# Patient Record
Sex: Male | Born: 1995
Health system: Southern US, Community
[De-identification: ages and names within clinical notes are randomized; demographics above are authoritative.]

## PROBLEM LIST (undated history)

## (undated) DIAGNOSIS — K5900 Constipation, unspecified: Secondary | ICD-10-CM

## (undated) DIAGNOSIS — F209 Schizophrenia, unspecified: Secondary | ICD-10-CM

## (undated) DIAGNOSIS — M545 Low back pain, unspecified: Secondary | ICD-10-CM

## (undated) DIAGNOSIS — F319 Bipolar disorder, unspecified: Secondary | ICD-10-CM

## (undated) DIAGNOSIS — Z8679 Personal history of other diseases of the circulatory system: Secondary | ICD-10-CM

## (undated) DIAGNOSIS — E079 Disorder of thyroid, unspecified: Secondary | ICD-10-CM

## (undated) DIAGNOSIS — K589 Irritable bowel syndrome without diarrhea: Secondary | ICD-10-CM

## (undated) DIAGNOSIS — M2550 Pain in unspecified joint: Secondary | ICD-10-CM

## (undated) DIAGNOSIS — R61 Generalized hyperhidrosis: Secondary | ICD-10-CM

## (undated) DIAGNOSIS — F329 Major depressive disorder, single episode, unspecified: Secondary | ICD-10-CM

## (undated) DIAGNOSIS — F418 Other specified anxiety disorders: Secondary | ICD-10-CM

## (undated) DIAGNOSIS — E559 Vitamin D deficiency, unspecified: Secondary | ICD-10-CM

## (undated) HISTORY — DX: Constipation, unspecified: K59.00

## (undated) HISTORY — DX: Vitamin D deficiency, unspecified: E55.9

## (undated) HISTORY — DX: Personal history of other diseases of the circulatory system: Z86.79

## (undated) HISTORY — DX: Pain in unspecified joint: M25.50

## (undated) HISTORY — DX: Generalized hyperhidrosis: R61

## (undated) HISTORY — DX: Bipolar disorder, unspecified: F31.9

## (undated) HISTORY — DX: Low back pain, unspecified: M54.50

## (undated) HISTORY — DX: Major depressive disorder, single episode, unspecified: F32.9

## (undated) HISTORY — DX: Irritable bowel syndrome, unspecified: K58.9

## (undated) HISTORY — DX: Disorder of thyroid, unspecified: E07.9

## (undated) HISTORY — DX: Schizophrenia, unspecified: F20.9

## (undated) HISTORY — DX: Other specified anxiety disorders: F41.8

## (undated) HISTORY — PX: NO PAST SURGERIES: SHX2092

---
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
## 2016-05-11 ENCOUNTER — Ambulatory Visit (INDEPENDENT_AMBULATORY_CARE_PROVIDER_SITE_OTHER): Payer: 59 | Admitting: Family Medicine

## 2016-05-11 ENCOUNTER — Encounter: Payer: Self-pay | Admitting: Family Medicine

## 2016-05-11 VITALS — BP 104/68 | HR 77 | Temp 98.1°F | Ht 68.0 in | Wt 167.8 lb

## 2016-05-11 DIAGNOSIS — F418 Other specified anxiety disorders: Secondary | ICD-10-CM | POA: Diagnosis not present

## 2016-05-11 MED ORDER — DULOXETINE HCL 30 MG PO CPEP: 30.0000 mg | ORAL_CAPSULE | Freq: Every day | ORAL | 1 refills | Status: DC

## 2016-05-11 MED FILL — DULoxetine HCL 30 MG CPEP: 30 | 30 days supply | Qty: 30 | Fill #0

## 2016-05-11 NOTE — Progress Notes (Signed)
Chief Complaint  Patient presents with  . Establish Care    Pt would like Rx for depression and has been seeing a Psychologist    Subjective Albert [name] is an 20 y.o. male who presents with depression and anxiety. He is here with his mother. Symptoms began around 3 years ago. Anxiety symptoms: difficulty concentrating, insomnia, racing thoughts, fixating and worrying. Depressive symptoms depressed mood, anhedonia, insomnia, fatigue, difficulty concentrating,.  Family history significant for anxiety and depression. His mother had a bad reaction to Prozac and his father had a bad reaction to Lexapro. Social stressors include school. He is currently being treated with Individual therapy and has Been on any medication for this issue. He is following with a psychologist.  Past Medical History:  Diagnosis Date  . History of PSVT (paroxysmal supraventricular tachycardia)     Medications Takes no medications routinely.  Allergies Allergies  Allergen Reactions  . Decongestant [Oxymetazoline]     Allergic to all decongestants  . Sulfa Antibiotics     Family history of severe reactions   Family History Family History  Problem Relation Age of Onset  . Sudden death Neg Hx   . Heart attack Neg Hx     Review Of Systems Constitutional:  no unexplained fevers, sweats, or chills Cardiovascular:  no chest pain, no palpitations Gastrointestinal:  no nausea, vomiting, diarrhea, or constipation Psychiatric: as noted in HPI  Exam BP 104/68 (BP Location: Left Arm, Patient Position: Sitting, Cuff Size: Small)   Pulse 77   Temp 98.1 F (36.7 C) (Oral)   Ht 5\' 8"  (1.727 m)   Wt 167 lb 12.8 oz (76.1 kg)   SpO2 98%   BMI 25.51 kg/m  General:  well developed, well nourished, in no apparent distress Neck: neck supple without adenopathy, thyromegaly, or masses Lungs:  clear to auscultation, breath sounds equal bilaterally, normal respiratory effort without accessory muscle  use Cardio:  regular rate and rhythm without murmurs Abdomen:  abdomen soft, nontender; bowel sounds normal; no masses or organomegaly Neuro:  deep tendon reflexes normal and symmetric and no cerebellar signs or ataxia noted Psych: well oriented with normal range of affect and age-appropriate judgement/insight  Assessment and Plan  Anxiety with depression - Plan: DULoxetine (CYMBALTA) 30 MG capsule  Status: New  Counseled on the diagnosis, course and treatment of the above condition. Continue with counseling. Discussed starting supplemental vitamin B6 and continue with exercise. Could add weightlifting to his regimen. Suicidal ideation was strongly denied Follow up in 1 mo. The patient and his mother voiced understanding and agreement to the plan.  [place], DO 05/11/16 11:06 AM

## 2016-05-11 NOTE — Progress Notes (Signed)
Pre visit review using our clinic review tool, if applicable. No additional management support is needed unless otherwise documented below in the visit note. 

## 2016-06-10 ENCOUNTER — Ambulatory Visit (INDEPENDENT_AMBULATORY_CARE_PROVIDER_SITE_OTHER): Payer: 59 | Admitting: Family Medicine

## 2016-06-10 ENCOUNTER — Encounter: Payer: Self-pay | Admitting: Family Medicine

## 2016-06-10 VITALS — BP 104/66 | HR 68 | Temp 98.2°F | Ht 69.0 in | Wt 166.0 lb

## 2016-06-10 DIAGNOSIS — F418 Other specified anxiety disorders: Secondary | ICD-10-CM

## 2016-06-10 MED ORDER — DULOXETINE HCL 30 MG PO CPEP: 30.0000 mg | ORAL_CAPSULE | Freq: Every day | ORAL | 5 refills | Status: DC

## 2016-06-10 MED FILL — DULoxetine HCL 30 MG CPEP: 30 | 30 days supply | Qty: 30 | Fill #0

## 2016-06-10 NOTE — Progress Notes (Signed)
Chief Complaint  Patient presents with  . Follow-up    anxiety/depression Pt reports doing better but is unsure if it is related to the mediaction     Subjective Albert [name] presents for f/u anxiety/depression.  Seen 1 mo ago and started on Cymbalta.  Doing better overall, doing well on medication, no side effects. Stressors in life have decreased as his class schedule and living environment (from an aesthetic approach) have improved. He is going to start boxing as a hobby. No thoughts of harming self or others. No self-medication with alcohol, prescription drugs or illicit drugs.  ROS Psych: No homicidal or suicidal thoughts  Past Medical History:  Diagnosis Date  . History of PSVT (paroxysmal supraventricular tachycardia)    Family History  Problem Relation Age of Onset  . Sudden death Neg Hx   . Heart attack Neg Hx    Allergies as of 06/10/2016      Reactions   Decongestant [oxymetazoline]    Allergic to all decongestants   Sulfa Antibiotics    Family history of severe reactions      Medication List       Accurate as of 06/10/16 11:28 AM. Always use your most recent med list.          DULoxetine 30 MG capsule Commonly known as:  CYMBALTA Take 1 capsule (30 mg total) by mouth daily.       Exam BP 104/66 (BP Location: Right Arm, Patient Position: Sitting, Cuff Size: Small)   Pulse 68   Temp 98.2 F (36.8 C) (Oral)   Ht 5\' 9"  (1.753 m)   Wt 166 lb (75.3 kg)   SpO2 98%   BMI 24.51 kg/m  General:  well developed, well nourished, in no apparent distress Neck: neck supple without adenopathy, thyromegaly, or masses Lungs:  clear to auscultation, breath sounds equal bilaterally, no respiratory distress Cardio:  regular rate and rhythm without murmurs, heart sounds without clicks or rubs Psych: well oriented with normal range of affect and age-appropriate judgement/insight, alert and oriented x4.  Assessment and Plan  Anxiety with depression - Plan:  DULoxetine (CYMBALTA) 30 MG capsule  Orders as above. Will stay on current dose of Cymbalta, discussed coming off of the medication with pt. He would like to stay on it longer and discuss coming off of it at the next appointment. F/u in 4 mo after he finishes the semester, sooner if needed. The patient voiced understanding and agreement to the plan.  [place], DO 06/10/16 11:28 AM

## 2016-06-10 NOTE — Progress Notes (Signed)
Pre visit review using our clinic review tool, if applicable. No additional management support is needed unless otherwise documented below in the visit note. 

## 2016-06-10 NOTE — Patient Instructions (Signed)
If you decide to wean off of your medication, take a tab every other day for 5 doses, then stop. Please let our office know if you make this change.

## 2016-07-08 MED FILL — DULoxetine HCL 30 MG CPEP: 30 | 30 days supply | Qty: 30 | Fill #1

## 2016-08-05 MED FILL — DULoxetine HCL 30 MG CPEP: 30 | 30 days supply | Qty: 30 | Fill #2

## 2016-08-12 ENCOUNTER — Telehealth: Payer: Self-pay | Admitting: Family Medicine

## 2016-08-12 NOTE — Telephone Encounter (Signed)
°  Relation to FX:OVAN Call back number:[phone]  Reason for call:  Patient requesting increase to 60 MG from 30, patient states the 60 would be more effective regarding the DULoxetine (CYMBALTA), please advise

## 2016-08-15 NOTE — Telephone Encounter (Signed)
Called and spoke with the pt and informed him of the message below.  Pt verbalized understanding and agreed.  Pt will check with the pharmacy and will let me know if I need to send in a new prescription for the Cymbalta now.//AB/CMA

## 2016-08-15 NOTE — Telephone Encounter (Signed)
OK. Take 2 caps daily until he runs out. We can call in higher dose at that time. Schedule in 4-6 weeks with me to see how we are doing. TY.

## 2016-08-22 MED ORDER — DULOXETINE HCL 60 MG PO CPEP
60.0000 mg | ORAL_CAPSULE | Freq: Every day | ORAL | 1 refills | Status: DC
Start: 2016-08-22 — End: 2016-09-21

## 2016-08-22 NOTE — Telephone Encounter (Addendum)
Called and spoke with the pt and informed him that the  prescription has been sent to the pharmacy.  Also informed him that Dr. Nani Ravens would like to see him in 4-5 weeks after starting the new dose.  Pt agreed and was scheduled for (Fri-09/30/16 @ 9:00am).//AB/CMA

## 2016-08-22 NOTE — Telephone Encounter (Signed)
New Rx sent to the pharmacy by e-script.//AB/CMA

## 2016-08-22 NOTE — Addendum Note (Signed)
Addended by: Harl Bowie on: 08/22/2016 04:48 PM   Modules accepted: Orders

## 2016-08-22 NOTE — Telephone Encounter (Signed)
Patient states he need rx forDULoxetine (CYMBALTA) 30 MG capsule increased to  60mg  [facility] [phone]  Call back number

## 2016-08-23 MED FILL — DULoxetine HCL 60 MG CPEP: 60 | 30 days supply | Qty: 30 | Fill #0

## 2016-09-21 ENCOUNTER — Telehealth: Payer: Self-pay | Admitting: Family Medicine

## 2016-09-21 MED ORDER — DULOXETINE HCL 60 MG PO CPEP: 60.0000 mg | ORAL_CAPSULE | Freq: Every day | ORAL | 1 refills | Status: DC

## 2016-09-21 MED FILL — DULoxetine HCL 60 MG CPEP: 60 | 30 days supply | Qty: 30 | Fill #1

## 2016-09-21 NOTE — Telephone Encounter (Signed)
Caller name: Relationship to patient: Self Can be reached: [phone]  Pharmacy: [facility] [place]  Reason for call: Refill DULoxetine (CYMBALTA) 60 MG capsule [[id number]]

## 2016-09-21 NOTE — Telephone Encounter (Signed)
Patient notified that rx has been sent in and to keep his appt 09/30/16.

## 2016-09-30 ENCOUNTER — Encounter: Payer: Self-pay | Admitting: Family Medicine

## 2016-09-30 ENCOUNTER — Ambulatory Visit (INDEPENDENT_AMBULATORY_CARE_PROVIDER_SITE_OTHER): Payer: 59 | Admitting: Family Medicine

## 2016-09-30 VITALS — BP 106/66 | HR 52 | Temp 98.2°F | Ht 69.0 in | Wt 160.2 lb

## 2016-09-30 DIAGNOSIS — F418 Other specified anxiety disorders: Secondary | ICD-10-CM | POA: Diagnosis not present

## 2016-09-30 HISTORY — DX: Other specified anxiety disorders: F41.8

## 2016-09-30 MED ORDER — BUPROPION HCL ER (XL) 150 MG PO TB24: 150.0000 mg | ORAL_TABLET | Freq: Every day | ORAL | 2 refills | Status: DC

## 2016-09-30 MED FILL — buPROPion HCL ER (XL) 150 M: 150 | 30 days supply | Qty: 30 | Fill #0

## 2016-09-30 NOTE — Patient Instructions (Signed)
Let us know if you need anything.  Great work with Dietitian.

## 2016-09-30 NOTE — Progress Notes (Signed)
Chief Complaint  Patient presents with  . Follow-up    on medication-pt not sure if the meds are working    Subjective: Patient is a 21 y.o. male here for f/u anxiety and depression.   Pt currently on Cymbalta. Feels it is helping his anxiety symptoms. Just got done with finals and things have improved. Still having issues with depressive symptoms. He does work out routinely with running and recently started boxing which has also been helpful.  No SI or HI ideation. No self medication. He is going to work for [employer] this summer. He is out of the dorm which was stressful for him as well.  Sees a psychologist weekly. Feels it is a great help.   ROS: Psych: No SI or HI  Family History  Problem Relation Age of Onset  . Anxiety disorder Mother   . Sudden death Neg Hx   . Heart attack Neg Hx    Past Medical History:  Diagnosis Date  . Anxiety with depression 09/30/2016  . History of PSVT (paroxysmal supraventricular tachycardia)    Allergies  Allergen Reactions  . Decongestant [Oxymetazoline]     Allergic to all decongestants  . Sulfa Antibiotics     Family history of severe reactions    Current Outpatient Prescriptions:  .  DULoxetine (CYMBALTA) 60 MG capsule, Take 1 capsule (60 mg total) by mouth daily., Disp: 30 capsule, Rfl: 1 .  buPROPion (WELLBUTRIN XL) 150 MG 24 hr tablet, Take 1 tablet (150 mg total) by mouth daily., Disp: 30 tablet, Rfl: 2  Objective: BP 106/66 (BP Location: Left Arm, Patient Position: Sitting, Cuff Size: Normal)   Pulse (!) 52   Temp 98.2 F (36.8 C) (Oral)   Ht 5\' 9"  (1.753 m)   Wt 160 lb 3.2 oz (72.7 kg)   SpO2 98%   BMI 23.66 kg/m  General: Awake, appears stated age HEENT: MMM, EOMi  Heart: RRR, no murmurs Lungs: CTAB, no rales, wheezes or rhonchi. No accessory muscle use Psych: Age appropriate judgment and insight, normal affect and mood  Assessment and Plan: Anxiety with depression - Plan: buPROPion (WELLBUTRIN XL) 150 MG 24  hr tablet, DISCONTINUED: buPROPion (WELLBUTRIN XL) 150 MG 24 hr tablet  Orders as above. Cont Cymbalta. Start Wellbutrin. Keep up boxing and stay with counselor.  F/u in 6 weeks. The patient voiced understanding and agreement to the plan.  [place], DO 09/30/16  9:38 AM

## 2016-10-12 ENCOUNTER — Telehealth: Payer: Self-pay | Admitting: Family Medicine

## 2016-10-12 NOTE — Telephone Encounter (Signed)
Called patient at both numbers listed and left message to return call.

## 2016-10-12 NOTE — Telephone Encounter (Signed)
Stop Wellbutrin Continue Cymbalta 60 mg daily as before Anticipate that insomnia and compulsive behavior will gradually improve. Call in 2 weeks and discuss w/  PCP next steps

## 2016-10-12 NOTE — Telephone Encounter (Signed)
°  Relation to AV:WUJW Call back [place]   Reason for call:  Patient states he thinks buPROPion (WELLBUTRIN XL) 150 MG 24 hr tablet experiencing sleepless night, compulsive cleaning and anxiety, patient declined appointment stating he was last seen 09/30/16 and currently in [place], please advise   *informed patient PCP is out of the office, patient would like to speak with a nurse or covering physician.

## 2016-10-12 NOTE — Telephone Encounter (Signed)
Last OV 09/30/16 for anxiety/depression. Please advise.

## 2016-10-12 NOTE — Telephone Encounter (Signed)
Pt notified of instructions and verbalized understanding. He will call to discuss next steps w/ PCP.  6 week follow-up w/ PCP is also scheduled per last AVS.

## 2016-10-27 ENCOUNTER — Telehealth: Payer: Self-pay | Admitting: Family Medicine

## 2016-10-27 MED ORDER — DULOXETINE HCL 60 MG PO CPEP: 60.0000 mg | ORAL_CAPSULE | Freq: Every day | ORAL | 1 refills | Status: DC

## 2016-10-27 MED FILL — DULoxetine HCL 60 MG CPEP: 60 | 30 days supply | Qty: 30 | Fill #0

## 2016-10-27 NOTE — Telephone Encounter (Signed)
Pt request refill cymbalta 60 mg he has no more. Pt uses [facility].

## 2016-10-27 NOTE — Telephone Encounter (Signed)
He should have a refill, but I called in more just in case. TY.

## 2016-10-27 NOTE — Telephone Encounter (Signed)
Called and spoke with the pt and informed him of the message below.   Pt stated that he has already picked up the prescription.//AB/CMA

## 2016-10-31 ENCOUNTER — Encounter: Payer: Self-pay | Admitting: Family Medicine

## 2016-10-31 ENCOUNTER — Ambulatory Visit (INDEPENDENT_AMBULATORY_CARE_PROVIDER_SITE_OTHER): Payer: 59 | Admitting: Family Medicine

## 2016-10-31 VITALS — BP 128/70 | HR 75 | Temp 98.5°F | Ht 69.0 in | Wt 160.4 lb

## 2016-10-31 DIAGNOSIS — F418 Other specified anxiety disorders: Secondary | ICD-10-CM | POA: Diagnosis not present

## 2016-10-31 MED ORDER — QUETIAPINE FUMARATE 50 MG PO TABS: 50.0000 mg | ORAL_TABLET | Freq: Every day | ORAL | 0 refills | Status: DC

## 2016-10-31 MED FILL — QUETIAPINE FUMARATE 50 MG T: 50 | 30 days supply | Qty: 30 | Fill #0

## 2016-10-31 NOTE — Patient Instructions (Signed)
If things are not ideal in the next 1-2 weeks, call and we can try the doxepin.  Let us know if you have any issues with your psych appointment in July. I will see you if you need [name].

## 2016-10-31 NOTE — Progress Notes (Signed)
Chief Complaint  Patient presents with  . 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    Subjective: Patient is a 21 y.o. male here for anxiety follow up.  Did not do well with Wellbutrin- made him more anxious, decreased appetite. On July 3 he will see psych who want him to stay on Cymbalta. He is compliant with Cymbalta and is not having any adverse effects with this. He is having anxiety, depression, anhedonia, poor appetite, and poor sleep.  Some suicidal thoughts. No plan, listed things he has to live for including boxing, looking forward to getting his degree and his supportive family. No plan or means. No HI. No self medication.  ROS: Psych: As noted in HPI  Family History  Problem Relation Age of Onset  . Anxiety disorder Mother   . Sudden death Neg Hx   . Heart attack Neg Hx    Past Medical History:  Diagnosis Date  . Anxiety with depression 09/30/2016  . History of PSVT (paroxysmal supraventricular tachycardia)    Allergies  Allergen Reactions  . Decongestant [Oxymetazoline]     Allergic to all decongestants  . Sulfa Antibiotics     Family history of severe reactions    Current Outpatient Prescriptions:  .  DULoxetine (CYMBALTA) 60 MG capsule, Take 1 capsule (60 mg total) by mouth daily., Disp: 30 capsule, Rfl: 1 .  QUEtiapine (SEROQUEL) 50 MG tablet, Take 1 tablet (50 mg total) by mouth at bedtime., Disp: 30 tablet, Rfl: 0  Objective: BP 128/70 (BP Location: Left Arm, Patient Position: Sitting, Cuff Size: Normal)   Pulse 75   Temp 98.5 F (36.9 C) (Oral)   Ht 5\' 9"  (1.753 m)   Wt 160 lb 6.4 oz (72.8 kg)   SpO2 99%   BMI 23.69 kg/m  General: Awake, appears stated age HEENT: MMM, EOMi Heart: RRR, no murmurs Lungs: CTAB, no rales, wheezes or rhonchi. No accessory muscle use Psych: Age appropriate judgment and insight,  normal affect and mood  Assessment and Plan: Anxiety with depression - Plan: QUEtiapine (SEROQUEL) 50 MG tablet  Orders as above. Seroquel to help with both symptoms and underlying issue. Will call in 1-2 weeks and let us know if he would like to change to doxepin.  F/u prn as he is going to see psych to take over in around 3 weeks.  The patient voiced understanding and agreement to the plan.  [place], DO 10/31/16  10:45 AM

## 2016-11-04 ENCOUNTER — Telehealth: Payer: Self-pay | Admitting: Family Medicine

## 2016-11-04 NOTE — Telephone Encounter (Signed)
Patient mother dropped off immuization paper work, requesting it to be completed and emailed to [email or web address], documents placed in tray at front desk

## 2016-11-07 ENCOUNTER — Telehealth: Payer: Self-pay | Admitting: *Deleted

## 2016-11-07 NOTE — Telephone Encounter (Signed)
Enter in error.//AB/CMA

## 2016-11-07 NOTE — Telephone Encounter (Signed)
Called and [facility] @ 7:39am @ [phone]) asking the pt to RTC regarding Immunization record form.//AB/CMA

## 2016-11-10 NOTE — Telephone Encounter (Signed)
Mother returning call best # [phone]

## 2016-11-10 NOTE — Telephone Encounter (Signed)
Called and [facility] @ 9:37am @ [phone]) asking the pt to RTC regarding form to be filled out.//AB/CMA

## 2016-11-16 ENCOUNTER — Telehealth: Payer: Self-pay | Admitting: Family Medicine

## 2016-11-16 MED ORDER — LORAZEPAM 0.5 MG PO TABS: 0.5000 mg | ORAL_TABLET | Freq: Every day | ORAL | 0 refills | Status: DC | PRN

## 2016-11-16 MED FILL — LORazepam 0.5 MG TABS: 0.5 | 6 days supply | Qty: 6 | Fill #0

## 2016-11-16 NOTE — Telephone Encounter (Signed)
Wellbutrin might be better if it is more depressive symptoms. We can try Ativan if his anxiety is the issue. TY.

## 2016-11-16 NOTE — Telephone Encounter (Signed)
Caller name: Lattie Haw Relationship to patient: mother Can be reached: [phone] [employer]'s cell  Reason for call: pt mom called requesting Angie to f/u with pt about medication change they discussed earlier today

## 2016-11-16 NOTE — Telephone Encounter (Signed)
Pt's mother called to say that the pt was put on Seroquel for sleep.  He is sleeping better,but his depression is worse.  The pt has an appt a Surveyor, mining on (Wed-11/22/16).  The pt's mother would like to stop the Seroquel and would like to request something simply and short term until Wed.  She's requesting Ativan .5mg  #6.  Please advise.//AB/CMA

## 2016-11-17 ENCOUNTER — Ambulatory Visit: Payer: 59 | Admitting: Family Medicine

## 2016-11-18 NOTE — Telephone Encounter (Signed)
Spoke with the pt on (11/16/16) and informed him of the message below.  Pt stated that he has tried the Wellbutrin before and it did not work.  Informed the pt that Dr. Napoleon Form the Ativan.  New prescription faxed to the pharmacy.  Confirmation received.//AB/CMA

## 2016-11-18 NOTE — Telephone Encounter (Signed)
Called and spoke with the pt and informed him that the Immunization sheet has been completed and signed and I will leave it up front for his mother to pickup.  He verbalized understanding and agreed.//AB/CMA

## 2016-11-18 NOTE — Telephone Encounter (Signed)
Wellbutrin might be better if it is more depressive symptoms. We can try Ativan if his anxiety is the issue. TY.

## 2016-11-22 ENCOUNTER — Telehealth: Payer: Self-pay | Admitting: Family Medicine

## 2016-11-22 DIAGNOSIS — F3181 Bipolar II disorder: Secondary | ICD-10-CM | POA: Diagnosis not present

## 2016-11-22 MED FILL — LORazepam 0.5 MG TABS: 0.5 | 30 days supply | Qty: 60 | Fill #0

## 2016-11-22 MED FILL — LITHIUM CARBONATE ER 300 MG: 300 | 30 days supply | Qty: 30 | Fill #0

## 2016-11-22 MED FILL — DULoxetine HCL 60 MG CPEP: 60 | 30 days supply | Qty: 30 | Fill #0

## 2016-11-22 NOTE — Telephone Encounter (Signed)
Caller name:Dr Francene Finders Relationship to patient: Can be reached:[phone] Pharmacy:  Reason for call:Reqesting call back, would like to speak with provider regarding patient. Patient has just established care with her

## 2016-11-24 MED FILL — DULoxetine HCL 20 MG CPEP: 20 | 30 days supply | Qty: 90 | Fill #0

## 2016-11-24 NOTE — Telephone Encounter (Signed)
Tried to call Dr. Blair Dolphin, LVM.

## 2016-12-01 DIAGNOSIS — F3181 Bipolar II disorder: Secondary | ICD-10-CM | POA: Diagnosis not present

## 2016-12-07 NOTE — Telephone Encounter (Signed)
Spoke with Dr. Blair Dolphin about patient's case. She asked me my thoughts of the pt. Discussed that we had some initial success tx'ing for anxiety/depression that I initially thought was situational and then response was poor. Tried a few more things before he was scheduled with psych and then have not had follow up. 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 She has him on a low dose of lithium thinking it could be related to schizophrenia or bipolar. A dedicated psychology evaluation is coming up. She will keep [name] updated.

## 2016-12-09 DIAGNOSIS — F3181 Bipolar II disorder: Secondary | ICD-10-CM | POA: Diagnosis not present

## 2016-12-09 MED FILL — LITHIUM CARBONATE ER 300 MG: 300 | 30 days supply | Qty: 60 | Fill #0

## 2016-12-23 DIAGNOSIS — F32A Depression, unspecified: Secondary | ICD-10-CM | POA: Insufficient documentation

## 2016-12-23 DIAGNOSIS — F419 Anxiety disorder, unspecified: Secondary | ICD-10-CM | POA: Insufficient documentation

## 2016-12-23 DIAGNOSIS — F3181 Bipolar II disorder: Secondary | ICD-10-CM | POA: Diagnosis not present

## 2016-12-27 MED FILL — DULoxetine HCL 20 MG CPEP: 20 | 30 days supply | Qty: 90 | Fill #1

## 2016-12-27 MED FILL — ARIPiprazole 2 MG TABS: 2 | 30 days supply | Qty: 30 | Fill #0

## 2017-01-04 ENCOUNTER — Telehealth: Payer: Self-pay | Admitting: Family Medicine

## 2017-01-04 DIAGNOSIS — F418 Other specified anxiety disorders: Secondary | ICD-10-CM | POA: Diagnosis not present

## 2017-01-04 NOTE — Telephone Encounter (Signed)
Please advise.//AB/CMA 

## 2017-01-04 NOTE — Telephone Encounter (Signed)
Pt's mom called in because she said that pt is going away to school and need a second meningitis vac. She would like to have orders placed by provider. I will call back to schedule.    Mom - work [phone]

## 2017-01-04 NOTE — Telephone Encounter (Signed)
That's fine TY

## 2017-01-05 ENCOUNTER — Ambulatory Visit (INDEPENDENT_AMBULATORY_CARE_PROVIDER_SITE_OTHER): Payer: 59 | Admitting: Behavioral Health

## 2017-01-05 DIAGNOSIS — Z23 Encounter for immunization: Secondary | ICD-10-CM

## 2017-01-05 DIAGNOSIS — F3181 Bipolar II disorder: Secondary | ICD-10-CM | POA: Diagnosis not present

## 2017-01-05 MED FILL — LITHIUM CARBONATE ER 300 MG: 300 | 30 days supply | Qty: 60 | Fill #0

## 2017-01-05 NOTE — Progress Notes (Signed)
Pre visit review using our clinic review tool, if applicable. No additional management support is needed unless otherwise documented below in the visit note.  Patient came in clinic for meningococcal vaccination. IM injection was given in the left deltoid. Patient tolerated injection well. He will call the office to schedule his next appointment.

## 2017-01-09 DIAGNOSIS — F418 Other specified anxiety disorders: Secondary | ICD-10-CM | POA: Diagnosis not present

## 2017-01-10 MED FILL — LITHIUM CARBONATE 300 MG CA: 300 | 30 days supply | Qty: 90 | Fill #0

## 2017-01-20 MED FILL — LORazepam 1 MG TABS: 1 | 30 days supply | Qty: 60 | Fill #0

## 2017-01-25 MED FILL — ARIPiprazole 2 MG TABS: 2 | 30 days supply | Qty: 30 | Fill #1

## 2017-01-25 MED FILL — DULoxetine HCL 20 MG CPEP: 20 | 30 days supply | Qty: 90 | Fill #0

## 2017-01-27 DIAGNOSIS — F333 Major depressive disorder, recurrent, severe with psychotic symptoms: Secondary | ICD-10-CM | POA: Diagnosis not present

## 2017-01-27 DIAGNOSIS — F3181 Bipolar II disorder: Secondary | ICD-10-CM | POA: Diagnosis not present

## 2017-02-06 DIAGNOSIS — F333 Major depressive disorder, recurrent, severe with psychotic symptoms: Secondary | ICD-10-CM | POA: Diagnosis not present

## 2017-02-06 DIAGNOSIS — F418 Other specified anxiety disorders: Secondary | ICD-10-CM | POA: Diagnosis not present

## 2017-02-08 MED FILL — LITHIUM CARBONATE 300 MG CA: 300 | 30 days supply | Qty: 90 | Fill #1

## 2017-02-17 MED FILL — ARIPiprazole 2 MG TABS: 2 | 10 days supply | Qty: 10 | Fill #2

## 2017-02-17 MED FILL — LORazepam 1 MG TABS: 1 | 30 days supply | Qty: 90 | Fill #0

## 2017-02-21 MED FILL — ARIPiprazole 2 MG TABS: 2 | 30 days supply | Qty: 30 | Fill #3

## 2017-02-23 DIAGNOSIS — F333 Major depressive disorder, recurrent, severe with psychotic symptoms: Secondary | ICD-10-CM | POA: Diagnosis not present

## 2017-02-23 MED FILL — SERTRALINE HCL 50 MG TABLET: 50 | 35 days supply | Qty: 60 | Fill #0

## 2017-03-06 DIAGNOSIS — F333 Major depressive disorder, recurrent, severe with psychotic symptoms: Secondary | ICD-10-CM | POA: Diagnosis not present

## 2017-03-08 MED FILL — LITHIUM CARBONATE 300 MG CA: 300 | 90 days supply | Qty: 270 | Fill #0

## 2017-03-09 MED FILL — ARIPiprazole 2 MG TABS: 2 | 30 days supply | Qty: 60 | Fill #0

## 2017-03-24 DIAGNOSIS — F333 Major depressive disorder, recurrent, severe with psychotic symptoms: Secondary | ICD-10-CM | POA: Diagnosis not present

## 2017-03-24 MED FILL — LORazepam 1 MG TABS: 1 | 30 days supply | Qty: 90 | Fill #0

## 2017-03-30 MED FILL — SERTRALINE HCL 50 MG TABLET: 50 | 30 days supply | Qty: 60 | Fill #1

## 2017-04-07 DIAGNOSIS — F333 Major depressive disorder, recurrent, severe with psychotic symptoms: Secondary | ICD-10-CM | POA: Diagnosis not present

## 2017-04-07 MED FILL — SERTRALINE HCL 100 MG TAB: 100 | 30 days supply | Qty: 60 | Fill #0

## 2017-04-10 DIAGNOSIS — F333 Major depressive disorder, recurrent, severe with psychotic symptoms: Secondary | ICD-10-CM | POA: Diagnosis not present

## 2017-05-05 DIAGNOSIS — F333 Major depressive disorder, recurrent, severe with psychotic symptoms: Secondary | ICD-10-CM | POA: Diagnosis not present

## 2017-05-05 MED FILL — LORazepam 1 MG TABS: 1 | 30 days supply | Qty: 90 | Fill #1

## 2017-05-05 MED FILL — SERTRALINE HCL 100 MG TAB: 100 | 30 days supply | Qty: 60 | Fill #1

## 2017-05-05 MED FILL — traZODone HCL 50 MG TABS: 50 | 60 days supply | Qty: 30 | Fill #0

## 2017-05-05 MED FILL — SERTRALINE HCL 50 MG TABLET: 50 | 30 days supply | Qty: 60 | Fill #2

## 2017-05-11 MED FILL — LITHIUM CARBONATE 300 MG CA: 300 | 30 days supply | Qty: 120 | Fill #0

## 2017-05-17 DIAGNOSIS — F333 Major depressive disorder, recurrent, severe with psychotic symptoms: Secondary | ICD-10-CM | POA: Diagnosis not present

## 2017-05-18 DIAGNOSIS — F333 Major depressive disorder, recurrent, severe with psychotic symptoms: Secondary | ICD-10-CM | POA: Diagnosis not present

## 2017-05-18 MED FILL — VENLAFAXINE HCL ER 37.5 MG: 37.5 | 30 days supply | Qty: 30 | Fill #0

## 2017-05-18 MED FILL — clonazePAM 1 MG TABS: 1 | 8 days supply | Qty: 30 | Fill #0

## 2017-06-01 DIAGNOSIS — F333 Major depressive disorder, recurrent, severe with psychotic symptoms: Secondary | ICD-10-CM | POA: Diagnosis not present

## 2017-06-08 DIAGNOSIS — F333 Major depressive disorder, recurrent, severe with psychotic symptoms: Secondary | ICD-10-CM | POA: Diagnosis not present

## 2017-06-08 MED FILL — FLUoxetine HCL 20 MG TABS: 20 | 30 days supply | Qty: 60 | Fill #0

## 2017-06-08 MED FILL — hydrOXYzine HCL 25 MG TABS: 25 | 30 days supply | Qty: 60 | Fill #0

## 2017-06-15 DIAGNOSIS — F329 Major depressive disorder, single episode, unspecified: Secondary | ICD-10-CM | POA: Diagnosis not present

## 2017-06-15 DIAGNOSIS — Z0000 Encounter for general adult medical examination without abnormal findings: Secondary | ICD-10-CM | POA: Diagnosis not present

## 2017-06-19 MED FILL — LITHIUM CARBONATE 300 MG CA: 300 | 30 days supply | Qty: 120 | Fill #1

## 2017-06-22 DIAGNOSIS — F333 Major depressive disorder, recurrent, severe with psychotic symptoms: Secondary | ICD-10-CM | POA: Diagnosis not present

## 2017-06-22 MED FILL — DOXEPIN 10 MG CAPSULE: 10 | 30 days supply | Qty: 60 | Fill #0

## 2017-07-06 DIAGNOSIS — Z23 Encounter for immunization: Secondary | ICD-10-CM | POA: Diagnosis not present

## 2017-07-06 DIAGNOSIS — F3181 Bipolar II disorder: Secondary | ICD-10-CM | POA: Diagnosis not present

## 2017-07-06 MED FILL — DOXEPIN 50 MG CAPSULE: 50 | 30 days supply | Qty: 30 | Fill #0

## 2017-07-10 DIAGNOSIS — F3181 Bipolar II disorder: Secondary | ICD-10-CM | POA: Diagnosis not present

## 2017-07-11 MED FILL — LORazepam 1 MG TABS: 1 | 30 days supply | Qty: 90 | Fill #2

## 2017-07-11 MED FILL — LITHIUM CARBONATE 300 MG CA: 300 | 30 days supply | Qty: 150 | Fill #0

## 2017-07-20 DIAGNOSIS — F3181 Bipolar II disorder: Secondary | ICD-10-CM | POA: Diagnosis not present

## 2017-07-27 MED FILL — EMSAM 6 MG/24 HOURS PATCH: 6 | 30 days supply | Qty: 30 | Fill #0

## 2017-08-09 MED FILL — LITHIUM CARBONATE 300 MG CA: 300 | 30 days supply | Qty: 150 | Fill #1

## 2017-08-11 DIAGNOSIS — F3181 Bipolar II disorder: Secondary | ICD-10-CM | POA: Diagnosis not present

## 2017-08-15 MED FILL — EMSAM 9 MG/24 HOURS PATCH: 9 | 30 days supply | Qty: 30 | Fill #0

## 2017-08-28 DIAGNOSIS — F3181 Bipolar II disorder: Secondary | ICD-10-CM | POA: Diagnosis not present

## 2017-08-28 MED FILL — EMSAM 12 MG/24 HOURS PATCH: 12 | 30 days supply | Qty: 30 | Fill #0

## 2017-09-06 MED FILL — LITHIUM CARBONATE 300 MG CA: 300 | 30 days supply | Qty: 150 | Fill #2

## 2017-09-07 DIAGNOSIS — F3181 Bipolar II disorder: Secondary | ICD-10-CM | POA: Diagnosis not present

## 2017-09-13 MED FILL — EMSAM 9 MG/24 HOURS PATCH: 9 | 30 days supply | Qty: 30 | Fill #1

## 2017-09-28 DIAGNOSIS — F3181 Bipolar II disorder: Secondary | ICD-10-CM | POA: Diagnosis not present

## 2017-09-28 MED FILL — PHENELZINE SULFATE 15 MG TA: 15 | 30 days supply | Qty: 90 | Fill #0

## 2017-09-28 MED FILL — ZOLPIDEM TARTRATE 5 MG TABL: 5 | 30 days supply | Qty: 30 | Fill #0

## 2017-10-02 MED FILL — LITHIUM CARBONATE 300 MG CA: 300 | 30 days supply | Qty: 150 | Fill #0

## 2017-10-04 ENCOUNTER — Encounter: Payer: Self-pay | Admitting: Family Medicine

## 2017-10-04 ENCOUNTER — Ambulatory Visit: Payer: 59 | Admitting: Family Medicine

## 2017-10-04 ENCOUNTER — Telehealth: Payer: Self-pay

## 2017-10-04 VITALS — BP 120/80 | HR 68 | Temp 97.4°F | Ht 69.0 in | Wt 184.1 lb

## 2017-10-04 DIAGNOSIS — Z111 Encounter for screening for respiratory tuberculosis: Secondary | ICD-10-CM

## 2017-10-04 DIAGNOSIS — L700 Acne vulgaris: Secondary | ICD-10-CM | POA: Diagnosis not present

## 2017-10-04 MED ORDER — ADAPALENE-BENZOYL PEROXIDE 0.1-2.5 % EX GEL: CUTANEOUS | 2 refills | Status: DC

## 2017-10-04 NOTE — Patient Instructions (Addendum)
Let me know if medicine is too expensive and I will call in an alternative.    Acne Acne is a skin problem that causes pimples. Acne occurs when the pores in the skin get blocked. The pores may become infected with bacteria, or they may become red, sore, and swollen. Acne is a common skin problem, especially for teenagers. Acne usually goes away over time. What are the causes? Each pore contains an oil gland. Oil glands make an oily substance that is called sebum. Acne happens when these glands get plugged with sebum, dead skin cells, and dirt. Then, the bacteria that are normally found in the oil glands multiply and cause inflammation. Acne is commonly triggered by changes in your hormones. These hormonal changes can cause the oil glands to get bigger and to make more sebum. Factors that can make acne worse include:  Hormone changes during: ? Adolescence. ? Women's menstrual cycles. ? Pregnancy.  Oil-based cosmetics and hair products.  Harshly scrubbing the skin.  Strong soaps.  Stress.  Hormone problems that are due to certain diseases.  Long or oily hair rubbing against the skin.  Certain medicines.  Pressure from headbands, backpacks, or shoulder pads.  Exposure to certain oils and chemicals.  What increases the risk? This condition is more likely to develop in:  Teenagers.  People who have a family history of acne.  What are the signs or symptoms? Acne often occurs on the face, neck, chest, and upper back. Symptoms include:  Small, red bumps (pimples or papules).  Whiteheads.  Blackheads.  Small, pus-filled pimples (pustules).  Big, red pimples or pustules that feel tender.  More severe acne can cause:  An infected area that contains a collection of pus (abscess).  Hard, painful, fluid-filled sacs (cysts).  Scars.  How is this diagnosed? This condition is diagnosed with a medical history and physical exam. Blood tests may also be done. How is this  treated? Treatment for this condition can vary depending on the severity of your acne. Treatment may include:  Creams and lotions that prevent oil glands from clogging.  Creams and lotions that treat or prevent infections and inflammation.  Antibiotic medicines that are applied to the skin or taken as a pill.  Pills that decrease sebum production.  Birth control pills.  Light or laser treatments.  Surgery.  Injections of medicine into the affected areas.  Chemicals that cause peeling of the skin.  Your health care provider will also recommend the best way to take care of your skin. Good skin care is the most important part of treatment. Follow these instructions at home: Skin care Take care of your skin as told by your health care provider. You may be told to do these things:  Wash your skin gently at least two times each day, as well as: ? After you exercise. ? Before you go to bed.  Use mild soap.  Apply a water-based skin moisturizer after you wash your skin.  Use a sunscreen or sunblock with SPF 30 or greater. This is especially important if you are using acne medicines.  Choose cosmetics that will not plug your oil glands (are noncomedogenic).  Medicines  Take over-the-counter and prescription medicines only as told by your health care provider.  If you were prescribed an antibiotic medicine, apply or take it as told by your health care provider. Do not stop taking the antibiotic even if your condition improves. General instructions  Keep your hair clean and off of your  face. If you have oily hair, shampoo your hair regularly or daily.  Avoid leaning your chin or forehead against your hands.  Avoid wearing tight headbands or hats.  Avoid picking or squeezing your pimples. That can make your acne worse and cause scarring.  Keep all follow-up visits as told by your health care provider. This is important.  Shave gently and only when necessary.  Keep a food  journal to figure out if any foods are linked with your acne. Contact a health care provider if:  Your acne is not better after eight weeks.  Your acne gets worse.  You have a large area of skin that is red or tender.  You think that you are having side effects from any acne medicine. This information is not intended to replace advice given to you by your health care provider. Make sure you discuss any questions you have with your health care provider. Document Released: 05/06/2000 Document Revised: 01/08/2016 Document Reviewed: 07/16/2014 Elsevier Interactive Patient Education  [employer].

## 2017-10-04 NOTE — Progress Notes (Signed)
Pre visit review using our clinic review tool, if applicable. No additional management support is needed unless otherwise documented below in the visit note. 

## 2017-10-04 NOTE — Telephone Encounter (Signed)
PA initiated via Covermymeds; KEY: [id number]. Awaiting determination.

## 2017-10-04 NOTE — Progress Notes (Signed)
Chief Complaint  Patient presents with  . Follow-up  . Medication Problem    acne    Subjective: Patient is a 22 y.o. male here for acne.  Around 2 mo has been bothering him. Some on face, most bothersome on back. Uses OTC cleansers w/o relief. He works out in [employer] to get a better sweat. Also participates in boxing. No fevers.  ROS: Skin: +acne  Past Medical History:  Diagnosis Date  . Anxiety with depression 09/30/2016  . History of PSVT (paroxysmal supraventricular tachycardia)    Objective: BP 120/80 (BP Location: Left Arm, Patient Position: Sitting, Cuff Size: Normal)   Pulse 68   Temp (!) 97.4 F (36.3 C) (Oral)   Ht 5\' 9"  (1.753 m)   Wt 184 lb 2 oz (83.5 kg)   SpO2 98%   BMI 27.19 kg/m  General: Awake, appears stated age Lungs: No accessory muscle use Skin: See below; no drainage Psych: Age appropriate judgment and insight, flat affect     Back  Assessment and Plan: Acne vulgaris - Plan: Adapalene-Benzoyl Peroxide 0.1-2.5 % gel  Orders as above. Discussed topical med vs PO med depending on whether he is able to reach. He does have fam members who can help him apply. Counseled on hygiene. If too expensive, will call in topical Clinda.  TST today. Recheck in 2 days.  F/u in 2 mo to reck. The patient voiced understanding and agreement to the plan.  [place], DO 10/04/17  10:06 AM

## 2017-10-06 ENCOUNTER — Ambulatory Visit: Payer: 59

## 2017-10-06 DIAGNOSIS — Z111 Encounter for screening for respiratory tuberculosis: Secondary | ICD-10-CM

## 2017-10-06 LAB — TB SKIN TEST: TB SKIN TEST: NEGATIVE

## 2017-10-06 NOTE — Progress Notes (Signed)
Pre visit review using our clinic review tool, if applicable. No additional management support is needed unless otherwise documented below in the visit note.  Pt here today for PPD test reading.   Induration:77mm  Chest x-ray not required. Letter given to Pt documenting negative result.

## 2017-10-09 MED FILL — ADAPALENE 0.1% GEL: 0.1 | 30 days supply | Qty: 45 | Fill #0

## 2017-10-09 MED FILL — BENZOYL PEROXIDE 2.5% GEL: 2.5 | 30 days supply | Qty: 60 | Fill #0

## 2017-10-11 NOTE — Telephone Encounter (Signed)
PA approved.   The request has been approved. The authorization is effective for a maximum of 12 fills from 10/05/2017 to 10/05/2018, as long as the member is enrolled in their current health plan. The request was approved as submitted. A written notification letter will follow with additional details.

## 2017-10-23 DIAGNOSIS — F3181 Bipolar II disorder: Secondary | ICD-10-CM | POA: Diagnosis not present

## 2017-10-23 MED FILL — LORazepam 1 MG TABS: 1 | 30 days supply | Qty: 30 | Fill #0

## 2017-10-23 MED FILL — PHENELZINE SULFATE 15 MG TA: 15 | 30 days supply | Qty: 120 | Fill #0

## 2017-10-27 ENCOUNTER — Encounter: Payer: Self-pay | Admitting: Family Medicine

## 2017-10-27 ENCOUNTER — Ambulatory Visit: Payer: 59 | Admitting: Family Medicine

## 2017-10-27 VITALS — BP 120/80 | HR 74 | Temp 97.5°F | Ht 69.0 in | Wt 190.2 lb

## 2017-10-27 DIAGNOSIS — L700 Acne vulgaris: Secondary | ICD-10-CM

## 2017-10-27 MED ORDER — MINOCYCLINE HCL 100 MG PO TABS: 100.0000 mg | ORAL_TABLET | Freq: Two times a day (BID) | ORAL | 2 refills | Status: DC

## 2017-10-27 MED FILL — MINOCYCLINE HCL 100 MG TABL: 100 | 30 days supply | Qty: 60 | Fill #0

## 2017-10-27 NOTE — Progress Notes (Addendum)
Chief Complaint  Patient presents with  . Acne    Subjective: Patient is a 22 y.o. male here for f/u acne.  The patient was seen several weeks ago and treated for acne.  Benzyl peroxide was provided.  He reports some improvement, however is still not satisfied with progress.  His back and forehead are largely affected.  He stopped using conditioner to see if it would be helpful.  He is not having any new lesions or scarring.   ROS: Skin: +acne   Past Medical History:  Diagnosis Date  . Anxiety with depression 09/30/2016  . History of PSVT (paroxysmal supraventricular tachycardia)    Objective: BP 120/80 (BP Location: Left Arm, Patient Position: Sitting, Cuff Size: Normal)   Pulse 74   Temp (!) 97.5 F (36.4 C) (Oral)   Ht 5\' 9"  (1.753 m)   Wt 190 lb 4 oz (86.3 kg)   SpO2 97%   BMI 28.10 kg/m  General: Awake, appears stated age Lungs:No accessory muscle use Skin: See below Psych: Age appropriate judgment and insight        Assessment and Plan: Acne vulgaris - Plan: minocycline (DYNACIN) 100 MG tablet  Orders as above. Cont cleanser.  Avoid greasy and oily products.  Change shirts routinely when exercising. Follow-up in 4 weeks. The patient voiced understanding and agreement to the plan.  [place], DO 10/27/17  4:38 PM

## 2017-10-27 NOTE — Patient Instructions (Signed)
Continue using cleanser. Avoid greasy and oily products. Change shirts routinely when exercising and consider bringing a towel.   Let us know if you need anything.

## 2017-10-27 NOTE — Progress Notes (Signed)
Pre visit review using our clinic review tool, if applicable. No additional management support is needed unless otherwise documented below in the visit note. 

## 2017-10-31 ENCOUNTER — Ambulatory Visit: Payer: Self-pay | Admitting: *Deleted

## 2017-10-31 ENCOUNTER — Telehealth: Payer: Self-pay

## 2017-10-31 DIAGNOSIS — F418 Other specified anxiety disorders: Secondary | ICD-10-CM

## 2017-10-31 NOTE — Telephone Encounter (Signed)
Author phoned pt. To assess symptoms and to schedule lithium lab draw per Dr. Nani Ravens, per pt. Request. No answer, so VM left with call back number [phone]. Order placed. OK for [facility] to schedule lab draw appointment.

## 2017-10-31 NOTE — Telephone Encounter (Signed)
Pt. returned call. Pt. states the minocycline has helped "remarkably well" and still wants to take even if it is causing dizziness. Pt. stated he is going to start taking 100mg  daily (at night) instead of bid at this time in hopes of relieving his symptoms. Dr. Nani Ravens to be made aware. Lab appointment for lithium draw made for 6/12 at 755AM. Pt. States he recently had one done and it was 0.7, so he is not too concerned, but is still wanting to monitor it considering the new symptoms.

## 2017-10-31 NOTE — Telephone Encounter (Signed)
OK. May just be reaction to medicine. TY.

## 2017-10-31 NOTE — Telephone Encounter (Signed)
I returned call to pt.   He was started on Minocycline on 10/27/17 by Dr. Nani Ravens for acne.   He started having dizzy spells, poor balance, numbness in front part  of my face that began on Saturday.   Also having some diarrhea.  Was wondering if it's from the minocycline.  He then went on to tell me that he takes lithium.   He saw his psychiatrist yesterday and has requested he have his lithium level drawn.   He cannot order this in the [facility] system because he is at [facility].  Pt request his lithium be reduced and have a lithium level drawn.   I let him know that he would need to see Dr. Nani Ravens before he would make medication adjustments, most likely.   Pt did not want to make an appt.   "I just want to come have my lithium level drawn and have my dose reduced".  I called the flow coordinator, Raquel Sarna, and made aware of the situation.   She is going to check with Dr. Nani Ravens and call the pt back.  I let the pt know that they are checking with Dr. Nani Ravens and someone from the office will be calling him back.   He was agreeable to this plan.  Reason for Disposition . Caller has URGENT medication question about med that PCP prescribed and triager unable to answer question  Answer Assessment - Initial Assessment Questions 1. SYMPTOMS: "Do you have any symptoms?"     Saw Dr. Nani Ravens on Friday and was started on a new medication.  Minocycline for acne issues.    I'm having dizziness, poor balance, numbness in front part of my face.    I think the symptoms began Saturday.    I need a lithium level drawn.   I talked with my psytricist yesterday.   She's at [facility] and can't order labs in your system.    The symptoms could be from my lithium.   I'm having diarrhea.    2. SEVERITY: If symptoms are present, ask "Are they mild, moderate or severe?"     The medication for my acne is working well.  Protocols used: MEDICATION QUESTION CALL-A-AH

## 2017-10-31 NOTE — Telephone Encounter (Signed)
Pt. requesting lithium level draw since new symptoms of dizziness and diarrhea have appeared during day 2 of taking minocycline for acne. Pt. does not want to come in for OV. Routed to Dr. Nani Ravens for recommendation.

## 2017-10-31 NOTE — Telephone Encounter (Signed)
Noted  

## 2017-11-01 ENCOUNTER — Other Ambulatory Visit (INDEPENDENT_AMBULATORY_CARE_PROVIDER_SITE_OTHER): Payer: 59

## 2017-11-01 ENCOUNTER — Telehealth: Payer: Self-pay | Admitting: Family Medicine

## 2017-11-01 DIAGNOSIS — F418 Other specified anxiety disorders: Secondary | ICD-10-CM

## 2017-11-01 NOTE — Telephone Encounter (Signed)
Called left message to call back 

## 2017-11-01 NOTE — Telephone Encounter (Signed)
Copied from [place] [id number]. Topic: Inquiry >> Oct 31, 2017  8:22 AM Pricilla Handler wrote: Reason for CRM: Patient called requesting if Dr. Nani Ravens would order a Lithium Test to see if he has too much in his body. Patient has been dizzy since receiving a new medication from Dr. Nani Ravens last week. Patient's psychiatrist recommended that the patient have this test. Please call the patient at [phone].       Thank You!!!

## 2017-11-02 LAB — LITHIUM LEVEL: Lithium Lvl: 0.8 mmol/L (ref 0.6–1.2)

## 2017-11-02 NOTE — Telephone Encounter (Signed)
Patient informed. 

## 2017-11-06 DIAGNOSIS — F3181 Bipolar II disorder: Secondary | ICD-10-CM | POA: Diagnosis not present

## 2017-11-06 MED FILL — LITHIUM CARBONATE 300 MG CA: 300 | 30 days supply | Qty: 150 | Fill #3

## 2017-11-08 MED FILL — PHENELZINE SULFATE 15 MG TA: 15 | 30 days supply | Qty: 180 | Fill #0

## 2017-11-20 HISTORY — PX: HAND RECONSTRUCTION: SHX1730

## 2017-12-02 DIAGNOSIS — S6991XA Unspecified injury of right wrist, hand and finger(s), initial encounter: Secondary | ICD-10-CM | POA: Diagnosis not present

## 2017-12-02 DIAGNOSIS — S62622B Displaced fracture of medial phalanx of right middle finger, initial encounter for open fracture: Secondary | ICD-10-CM | POA: Diagnosis not present

## 2017-12-02 DIAGNOSIS — S62602A Fracture of unspecified phalanx of right middle finger, initial encounter for closed fracture: Secondary | ICD-10-CM | POA: Diagnosis not present

## 2017-12-02 DIAGNOSIS — S6291XA Unspecified fracture of right wrist and hand, initial encounter for closed fracture: Secondary | ICD-10-CM | POA: Diagnosis not present

## 2017-12-02 DIAGNOSIS — S62630B Displaced fracture of distal phalanx of right index finger, initial encounter for open fracture: Secondary | ICD-10-CM | POA: Diagnosis not present

## 2017-12-02 DIAGNOSIS — S62600A Fracture of unspecified phalanx of right index finger, initial encounter for closed fracture: Secondary | ICD-10-CM | POA: Diagnosis not present

## 2017-12-04 ENCOUNTER — Encounter: Payer: Self-pay | Admitting: Family Medicine

## 2017-12-04 ENCOUNTER — Other Ambulatory Visit: Payer: Self-pay | Admitting: Family Medicine

## 2017-12-04 ENCOUNTER — Ambulatory Visit: Payer: 59 | Admitting: Family Medicine

## 2017-12-04 VITALS — BP 122/76 | HR 84 | Temp 98.6°F | Ht 69.0 in | Wt 197.0 lb

## 2017-12-04 DIAGNOSIS — G4700 Insomnia, unspecified: Secondary | ICD-10-CM

## 2017-12-04 DIAGNOSIS — S4991XD Unspecified injury of right shoulder and upper arm, subsequent encounter: Secondary | ICD-10-CM

## 2017-12-04 DIAGNOSIS — R339 Retention of urine, unspecified: Secondary | ICD-10-CM | POA: Diagnosis not present

## 2017-12-04 DIAGNOSIS — S4991XA Unspecified injury of right shoulder and upper arm, initial encounter: Secondary | ICD-10-CM

## 2017-12-04 DIAGNOSIS — L700 Acne vulgaris: Secondary | ICD-10-CM

## 2017-12-04 DIAGNOSIS — T50905A Adverse effect of unspecified drugs, medicaments and biological substances, initial encounter: Secondary | ICD-10-CM | POA: Diagnosis not present

## 2017-12-04 LAB — URINALYSIS
Bilirubin Urine: NEGATIVE
Hgb urine dipstick: NEGATIVE
Ketones, ur: NEGATIVE
LEUKOCYTES UA: NEGATIVE
Nitrite: NEGATIVE
SPECIFIC GRAVITY, URINE: 1.015 (ref 1.000–1.030)
Total Protein, Urine: NEGATIVE
Urine Glucose: NEGATIVE
Urobilinogen, UA: 0.2 (ref 0.0–1.0)
pH: 6.5 (ref 5.0–8.0)

## 2017-12-04 MED ORDER — DOXYCYCLINE HYCLATE 100 MG PO TABS
100.0000 mg | ORAL_TABLET | Freq: Two times a day (BID) | ORAL | 1 refills | Status: DC
Start: 2017-12-04 — End: 2018-01-24

## 2017-12-04 MED ORDER — OXYCODONE HCL 5 MG PO CAPS: 5.0000 mg | ORAL_CAPSULE | Freq: Three times a day (TID) | ORAL | 0 refills | Status: DC | PRN

## 2017-12-04 MED FILL — DOXYCYCLINE HYCLATE 100 MG: 100 | 30 days supply | Qty: 60 | Fill #0

## 2017-12-04 MED FILL — oxyCODONE HCL 5 MG TABS: 5 | 6 days supply | Qty: 20 | Fill #0

## 2017-12-04 MED FILL — LITHIUM CARBONATE 300 MG CA: 300 | 30 days supply | Qty: 150 | Fill #0

## 2017-12-04 NOTE — Progress Notes (Signed)
Pre visit review using our clinic review tool, if applicable. No additional management support is needed unless otherwise documented below in the visit note. 

## 2017-12-04 NOTE — Progress Notes (Signed)
Chief Complaint  Patient presents with  . Follow-up    Subjective: Patient is a 22 y.o. male here for f/u acne.  Patient is currently using benzoyl peroxide and minocycline 100 mg daily.  He was initially placed on twice daily dosing, however it made him feel dizzy and lightheaded.  He notes some improvement, however there are some areas that are still bothering him.  His acne is mainly located on his forehead and back.  He also had an injury with a wood cutter 2 days ago.  He broke some bones in addition to flaying his skin.  It is quite painful.  He has been taking ibuprofen and oxycodone from the emergency department.  He has an appointment with an orthopedic surgeon this week.  He is gained around 20 pounds since starting/increasing his dose of Nardil.  He also wonders if anorgasmia is attributed to this as well.  His psychiatrist prescribes in this medicine.  He has had urinary retention for the past week.  No pain or bleeding.  He is not changed his diet or oral intake of anything.  No other new medications.  Feels a sharp pain in his navel region when he bears down.  This takes place around once monthly.  No injury or change in activity.  He does not believe he feels a bulge.  Patient also takes Ativan and Ambien to help him sleep.  He does not feel this is adequate.  He is requesting to change his Ativan to Librium.  His psychiatrist prescribes his Ativan and Ambien.  ROS: Const: no fevers Skin: As noted in HPI MSK: +RUE pain GU: +retention GI: No constipation Endo: +wt gain Cardiac: +palpitations Lungs: No sob Heme: No bruising Psych: +insomnia  Past Medical History:  Diagnosis Date  . Anxiety with depression 09/30/2016  . History of PSVT (paroxysmal supraventricular tachycardia)    Family History  Problem Relation Age of Onset  . Anxiety disorder Mother   . Sudden death Neg Hx   . Heart attack Neg Hx    Allergies as of 12/04/2017      Reactions   Decongestant  [oxymetazoline]    Allergic to all decongestants   Sulfa Antibiotics    Family history of severe reactions      Medication List        Accurate as of 12/04/17 11:10 AM. Always use your most recent med list.          Adapalene-Benzoyl Peroxide 0.1-2.5 % gel Apply thin layer over back and face nightly.   doxycycline 100 MG tablet Commonly known as:  VIBRA-TABS Take 1 tablet (100 mg total) by mouth 2 (two) times daily.   lithium 300 MG tablet Take 5 per day   LORazepam 2 MG tablet Commonly known as:  ATIVAN Take 2 mg by mouth daily as needed for anxiety.   oxycodone 5 MG capsule Commonly known as:  OXY-IR Take 1 capsule (5 mg total) by mouth every 8 (eight) hours as needed for pain.   phenelzine 15 MG tablet Commonly known as:  NARDIL Will titrate up to 60 mg per day   zolpidem 5 MG tablet Commonly known as:  AMBIEN Take 5 mg by mouth at bedtime as needed for sleep.       Objective: BP 122/76 (BP Location: Left Arm, Patient Position: Sitting, Cuff Size: Normal)   Pulse 84   Temp 98.6 F (37 C) (Oral)   Ht 5\' 9"  (1.753 m)   Wt 197 lb (  89.4 kg)   SpO2 96%   BMI 29.09 kg/m  General: Awake, appears stated age HEENT: MMM, EOMi Heart: RRR, no murmurs Skin: +acne on forehead and back; less inflammation and lesions are in stages of healing Abd: Soft, NT, ND, no bulges/masses/organomegaly Lungs: CTAB, no rales, wheezes or rhonchi. No accessory muscle use Psych: Age appropriate judgment and insight, flat affect MSK: Pt in sling and splint on RUE  Assessment and Plan: Acne vulgaris - Plan: doxycycline (VIBRA-TABS) 100 MG tablet  Injury of right upper extremity, initial encounter - Plan: oxycodone (OXY-IR) 5 MG capsule  Urine retention - Plan: Urinalysis  Adverse effect of drug, initial encounter  Insomnia, unspecified type  Change minocycline to doxy.  If no improvement, will refer to dermatology. Follow-up with Ortho as originally scheduled.  Ibuprofen,  Tylenol, ice, refill Oxy. UA. I explained that many of his complaints are related to the adverse effects of increasing the dose of the Nardil.  Follow-up with psychiatry for further titration of this. I will defer changing his benzodiazepine regimen to his psychiatrist. Follow-up in 2 weeks to discuss urinary retention further if he still having issues.  We will also see how he is doing on the doxycycline. The patient voiced understanding and agreement to the plan.  [place], DO 12/04/17  11:10 AM

## 2017-12-04 NOTE — Patient Instructions (Addendum)
OK to take Tylenol 1000 mg (2 extra strength tabs) or 975 mg (3 regular strength tabs) every 6 hours as needed.  Ibuprofen 400-600 mg (2-3 over the counter strength tabs) every 6 hours as needed for pain.  We will be in touch regarding your urinary issue.   Ice/cold pack over area for 10-15 min twice daily.  Stay hydrated.  EXERCISES  RANGE OF MOTION (ROM) AND STRETCHING EXERCISES - Low Back Pain Most people with lower back pain will find that their symptoms get worse with excessive bending forward (flexion) or arching at the lower back (extension). The exercises that will help resolve your symptoms will focus on the opposite motion.  If you have pain, numbness or tingling which travels down into your buttocks, leg or foot, the goal of the therapy is for these symptoms to move closer to your back and eventually resolve. Sometimes, these leg symptoms will get better, but your lower back pain may worsen. This is often an indication of progress in your rehabilitation. Be very alert to any changes in your symptoms and the activities in which you participated in the 24 hours prior to the change. Sharing this information with your caregiver will allow him or her to most efficiently treat your condition. These exercises may help you when beginning to rehabilitate your injury. Your symptoms may resolve with or without further involvement from your physician, physical therapist or athletic trainer. While completing these exercises, remember:   Restoring tissue flexibility helps normal motion to return to the joints. This allows healthier, less painful movement and activity.  An effective stretch should be held for at least 30 seconds.  A stretch should never be painful. You should only feel a gentle lengthening or release in the stretched tissue. FLEXION RANGE OF MOTION AND STRETCHING EXERCISES:  STRETCH - Flexion, Single Knee to Chest   Lie on a firm bed or floor with both legs extended in front of  you.  Keeping one leg in contact with the floor, bring your opposite knee to your chest. Hold your leg in place by either grabbing behind your thigh or at your knee.  Pull until you feel a gentle stretch in your low back. Hold 30 seconds.  Slowly release your grasp and repeat the exercise with the opposite side. Repeat 2 times. Complete this exercise 3 times per week.   STRETCH - Flexion, Double Knee to Chest  Lie on a firm bed or floor with both legs extended in front of you.  Keeping one leg in contact with the floor, bring your opposite knee to your chest.  Tense your stomach muscles to support your back and then lift your other knee to your chest. Hold your legs in place by either grabbing behind your thighs or at your knees.  Pull both knees toward your chest until you feel a gentle stretch in your low back. Hold 30 seconds.  Tense your stomach muscles and slowly return one leg at a time to the floor. Repeat 2 times. Complete this exercise 3 times per week.   STRETCH - Low Trunk Rotation  Lie on a firm bed or floor. Keeping your legs in front of you, bend your knees so they are both pointed toward the ceiling and your feet are flat on the floor.  Extend your arms out to the side. This will stabilize your upper body by keeping your shoulders in contact with the floor.  Gently and slowly drop both knees together to one side until  you feel a gentle stretch in your low back. Hold for 30 seconds.  Tense your stomach muscles to support your lower back as you bring your knees back to the starting position. Repeat the exercise to the other side. Repeat 2 times. Complete this exercise at least 3 times per week.   EXTENSION RANGE OF MOTION AND FLEXIBILITY EXERCISES:  STRETCH - Extension, Prone on Elbows   Lie on your stomach on the floor, a bed will be too soft. Place your palms about shoulder width apart and at the height of your head.  Place your elbows under your shoulders. If  this is too painful, stack pillows under your chest.  Allow your body to relax so that your hips drop lower and make contact more completely with the floor.  Hold this position for 30 seconds.  Slowly return to lying flat on the floor. Repeat 2 times. Complete this exercise 3 times per week.   RANGE OF MOTION - Extension, Prone Press Ups  Lie on your stomach on the floor, a bed will be too soft. Place your palms about shoulder width apart and at the height of your head.  Keeping your back as relaxed as possible, slowly straighten your elbows while keeping your hips on the floor. You may adjust the placement of your hands to maximize your comfort. As you gain motion, your hands will come more underneath your shoulders.  Hold this position 30 seconds.  Slowly return to lying flat on the floor. Repeat 2 times. Complete this exercise 3 times per week.   RANGE OF MOTION- Quadruped, Neutral Spine   Assume a hands and knees position on a firm surface. Keep your hands under your shoulders and your knees under your hips. You may place padding under your knees for comfort.  Drop your head and point your tailbone toward the ground below you. This will round out your lower back like an angry cat. Hold this position for 30 seconds.  Slowly lift your head and release your tail bone so that your back sags into a large arch, like an old horse.  Hold this position for 30 seconds.  Repeat this until you feel limber in your low back.  Now, find your "sweet spot." This will be the most comfortable position somewhere between the two previous positions. This is your neutral spine. Once you have found this position, tense your stomach muscles to support your low back.  Hold this position for 30 seconds. Repeat 2 times. Complete this exercise 3 times per week.   STRENGTHENING EXERCISES - Low Back Sprain These exercises may help you when beginning to rehabilitate your injury. These exercises should be  done near your "sweet spot." This is the neutral, low-back arch, somewhere between fully rounded and fully arched, that is your least painful position. When performed in this safe range of motion, these exercises can be used for people who have either a flexion or extension based injury. These exercises may resolve your symptoms with or without further involvement from your physician, physical therapist or athletic trainer. While completing these exercises, remember:   Muscles can gain both the endurance and the strength needed for everyday activities through controlled exercises.  Complete these exercises as instructed by your physician, physical therapist or athletic trainer. Increase the resistance and repetitions only as guided.  You may experience muscle soreness or fatigue, but the pain or discomfort you are trying to eliminate should never worsen during these exercises. If this pain does worsen,  stop and make certain you are following the directions exactly. If the pain is still present after adjustments, discontinue the exercise until you can discuss the trouble with your caregiver.  STRENGTHENING - Deep Abdominals, Pelvic Tilt   Lie on a firm bed or floor. Keeping your legs in front of you, bend your knees so they are both pointed toward the ceiling and your feet are flat on the floor.  Tense your lower abdominal muscles to press your low back into the floor. This motion will rotate your pelvis so that your tail bone is scooping upwards rather than pointing at your feet or into the floor. With a gentle tension and even breathing, hold this position for 3 seconds. Repeat 2 times. Complete this exercise 3 times per week.   STRENGTHENING - Abdominals, Crunches   Lie on a firm bed or floor. Keeping your legs in front of you, bend your knees so they are both pointed toward the ceiling and your feet are flat on the floor. Cross your arms over your chest.  Slightly tip your chin down without  bending your neck.  Tense your abdominals and slowly lift your trunk high enough to just clear your shoulder blades. Lifting higher can put excessive stress on the lower back and does not further strengthen your abdominal muscles.  Control your return to the starting position. Repeat 2 times. Complete this exercise 3 times per week.   STRENGTHENING - Quadruped, Opposite UE/LE Lift   Assume a hands and knees position on a firm surface. Keep your hands under your shoulders and your knees under your hips. You may place padding under your knees for comfort.  Find your neutral spine and gently tense your abdominal muscles so that you can maintain this position. Your shoulders and hips should form a rectangle that is parallel with the floor and is not twisted.  Keeping your trunk steady, lift your right hand no higher than your shoulder and then your left leg no higher than your hip. Make sure you are not holding your breath. Hold this position for 30 seconds.  Continuing to keep your abdominal muscles tense and your back steady, slowly return to your starting position. Repeat with the opposite arm and leg. Repeat 2 times. Complete this exercise 3 times per week.   STRENGTHENING - Abdominals and Quadriceps, Straight Leg Raise   Lie on a firm bed or floor with both legs extended in front of you.  Keeping one leg in contact with the floor, bend the other knee so that your foot can rest flat on the floor.  Find your neutral spine, and tense your abdominal muscles to maintain your spinal position throughout the exercise.  Slowly lift your straight leg off the floor about 6 inches for a count of 3, making sure to not hold your breath.  Still keeping your neutral spine, slowly lower your leg all the way to the floor. Repeat this exercise with each leg 2 times. Complete this exercise 3 times per week.  POSTURE AND BODY MECHANICS CONSIDERATIONS - Low Back Sprain Keeping correct posture when  sitting, standing or completing your activities will reduce the stress put on different body tissues, allowing injured tissues a chance to heal and limiting painful experiences. The following are general guidelines for improved posture.  While reading these guidelines, remember:  The exercises prescribed by your provider will help you have the flexibility and strength to maintain correct postures.  The correct posture provides the best environment for  your joints to work. All of your joints have less wear and tear when properly supported by a spine with good posture. This means you will experience a healthier, less painful body.  Correct posture must be practiced with all of your activities, especially prolonged sitting and standing. Correct posture is as important when doing repetitive low-stress activities (typing) as it is when doing a single heavy-load activity (lifting).  RESTING POSITIONS Consider which positions are most painful for you when choosing a resting position. If you have pain with flexion-based activities (sitting, bending, stooping, squatting), choose a position that allows you to rest in a less flexed posture. You would want to avoid curling into a fetal position on your side. If your pain worsens with extension-based activities (prolonged standing, working overhead), avoid resting in an extended position such as sleeping on your stomach. Most people will find more comfort when they rest with their spine in a more neutral position, neither too rounded nor too arched. Lying on a non-sagging bed on your side with a pillow between your knees, or on your back with a pillow under your knees will often provide some relief. Keep in mind, being in any one position for a prolonged period of time, no matter how correct your posture, can still lead to stiffness.  PROPER SITTING POSTURE In order to minimize stress and discomfort on your spine, you must sit with correct posture. Sitting with good  posture should be effortless for a healthy body. Returning to good posture is a gradual process. Many people can work toward this most comfortably by using various supports until they have the flexibility and strength to maintain this posture on their own. When sitting with proper posture, your ears will fall over your shoulders and your shoulders will fall over your hips. You should use the back of the chair to support your upper back. Your lower back will be in a neutral position, just slightly arched. You may place a small pillow or folded towel at the base of your lower back for  support.  When working at a desk, create an environment that supports good, upright posture. Without extra support, muscles tire, which leads to excessive strain on joints and other tissues. Keep these recommendations in mind:  CHAIR:  A chair should be able to slide under your desk when your back makes contact with the back of the chair. This allows you to work closely.  The chair's height should allow your eyes to be level with the upper part of your monitor and your hands to be slightly lower than your elbows.  BODY POSITION  Your feet should make contact with the floor. If this is not possible, use a foot rest.  Keep your ears over your shoulders. This will reduce stress on your neck and low back.  INCORRECT SITTING POSTURES  If you are feeling tired and unable to assume a healthy sitting posture, do not slouch or slump. This puts excessive strain on your back tissues, causing more damage and pain. Healthier options include:  Using more support, like a lumbar pillow.  Switching tasks to something that requires you to be upright or walking.  Talking a brief walk.  Lying down to rest in a neutral-spine position.  PROLONGED STANDING WHILE SLIGHTLY LEANING FORWARD  When completing a task that requires you to lean forward while standing in one place for a long time, place either foot up on a stationary 2-4  inch high object to help maintain the best  posture. When both feet are on the ground, the lower back tends to lose its slight inward curve. If this curve flattens (or becomes too large), then the back and your other joints will experience too much stress, tire more quickly, and can cause pain.  CORRECT STANDING POSTURES Proper standing posture should be assumed with all daily activities, even if they only take a few moments, like when brushing your teeth. As in sitting, your ears should fall over your shoulders and your shoulders should fall over your hips. You should keep a slight tension in your abdominal muscles to brace your spine. Your tailbone should point down to the ground, not behind your body, resulting in an over-extended swayback posture.   INCORRECT STANDING POSTURES  Common incorrect standing postures include a forward head, locked knees and/or an excessive swayback. WALKING Walk with an upright posture. Your ears, shoulders and hips should all line-up.  PROLONGED ACTIVITY IN A FLEXED POSITION When completing a task that requires you to bend forward at your waist or lean over a low surface, try to find a way to stabilize 3 out of 4 of your limbs. You can place a hand or elbow on your thigh or rest a knee on the surface you are reaching across. This will provide you more stability, so that your muscles do not tire as quickly. By keeping your knees relaxed, or slightly bent, you will also reduce stress across your lower back. CORRECT LIFTING TECHNIQUES  DO :  Assume a wide stance. This will provide you more stability and the opportunity to get as close as possible to the object which you are lifting.  Tense your abdominals to brace your spine. Bend at the knees and hips. Keeping your back locked in a neutral-spine position, lift using your leg muscles. Lift with your legs, keeping your back straight.  Test the weight of unknown objects before attempting to lift them.  Try to keep  your elbows locked down at your sides in order get the best strength from your shoulders when carrying an object.     Always ask for help when lifting heavy or awkward objects. INCORRECT LIFTING TECHNIQUES DO NOT:   Lock your knees when lifting, even if it is a small object.  Bend and twist. Pivot at your feet or move your feet when needing to change directions.  Assume that you can safely pick up even a paperclip without proper posture.

## 2017-12-06 DIAGNOSIS — M79644 Pain in right finger(s): Secondary | ICD-10-CM | POA: Diagnosis not present

## 2017-12-07 MED FILL — oxyCODONE HCL 5 MG TABS: 5 | 7 days supply | Qty: 40 | Fill #0

## 2017-12-08 DIAGNOSIS — S61322A Laceration with foreign body of right middle finger with damage to nail, initial encounter: Secondary | ICD-10-CM | POA: Diagnosis not present

## 2017-12-08 DIAGNOSIS — S62622B Displaced fracture of medial phalanx of right middle finger, initial encounter for open fracture: Secondary | ICD-10-CM | POA: Diagnosis not present

## 2017-12-08 DIAGNOSIS — S61320A Laceration with foreign body of right index finger with damage to nail, initial encounter: Secondary | ICD-10-CM | POA: Diagnosis not present

## 2017-12-08 DIAGNOSIS — S61312A Laceration without foreign body of right middle finger with damage to nail, initial encounter: Secondary | ICD-10-CM | POA: Diagnosis not present

## 2017-12-08 DIAGNOSIS — S61310A Laceration without foreign body of right index finger with damage to nail, initial encounter: Secondary | ICD-10-CM | POA: Diagnosis not present

## 2017-12-08 DIAGNOSIS — S66112A Strain of flexor muscle, fascia and tendon of right middle finger at wrist and hand level, initial encounter: Secondary | ICD-10-CM | POA: Diagnosis not present

## 2017-12-08 DIAGNOSIS — S62630B Displaced fracture of distal phalanx of right index finger, initial encounter for open fracture: Secondary | ICD-10-CM | POA: Diagnosis not present

## 2017-12-13 MED FILL — CEPHALEXIN 500 MG CAPSULE: 500 | 10 days supply | Qty: 40 | Fill #0

## 2017-12-18 ENCOUNTER — Ambulatory Visit: Payer: 59 | Admitting: Family Medicine

## 2017-12-18 ENCOUNTER — Encounter: Payer: Self-pay | Admitting: Family Medicine

## 2017-12-18 VITALS — BP 108/72 | HR 93 | Temp 97.9°F | Ht 69.0 in | Wt 197.4 lb

## 2017-12-18 DIAGNOSIS — R339 Retention of urine, unspecified: Secondary | ICD-10-CM | POA: Diagnosis not present

## 2017-12-18 DIAGNOSIS — L700 Acne vulgaris: Secondary | ICD-10-CM | POA: Diagnosis not present

## 2017-12-18 DIAGNOSIS — F3181 Bipolar II disorder: Secondary | ICD-10-CM | POA: Diagnosis not present

## 2017-12-18 DIAGNOSIS — R198 Other specified symptoms and signs involving the digestive system and abdomen: Secondary | ICD-10-CM

## 2017-12-18 MED FILL — LORazepam 1 MG TABS: 1 | 30 days supply | Qty: 60 | Fill #0

## 2017-12-18 MED FILL — PHENELZINE SULFATE 15 MG TA: 15 | 30 days supply | Qty: 180 | Fill #0

## 2017-12-18 NOTE — Progress Notes (Signed)
Chief Complaint  Patient presents with  . Follow-up    Subjective: Patient is a 22 y.o. male here for f/u.  His urinary retention issue is somewhat improved.  No new issues.  He is on Nardil.  Patient is also here for follow-up of his acne.  He was changed from minus cycling to doxycycline.  This change seem to help him take it twice daily rather than once daily.  He is not having any side effects.  He believes his acne is improved.  Over the past week, he has been off of oxycodone.  He notes that his stools are every 2-3 days and he will have a very large bowel movement to the point that he clogs the toilet.  Denies any bleeding or diarrhea.  He has having some straining.  His stool is not hard and small.  ROS: GU: As noted in HPI GI: As noted in HPI  Past Medical History:  Diagnosis Date  . Anxiety with depression 09/30/2016  . History of PSVT (paroxysmal supraventricular tachycardia)     Objective: BP 108/72 (BP Location: Left Arm, Patient Position: Sitting, Cuff Size: Normal)   Pulse 93   Temp 97.9 F (36.6 C) (Oral)   Ht 5\' 9"  (1.753 m)   Wt 197 lb 6 oz (89.5 kg)   SpO2 97%   BMI 29.15 kg/m  General: Awake, appears stated age HEENT: MMM, EOMi Heart: RRR, no murmurs Lungs: CTAB, no rales, wheezes or rhonchi. No accessory muscle use Abd: BS+, soft, nontender, nondistended, no masses or organomegaly; he is very ticklish on exam Psych: Age appropriate judgment and insight, normal affect and mood  Assessment and Plan: Acne vulgaris  Abnormal bowel movement  Urine retention   Monitor urinary retention.  He is to contact his psychiatrist if this happens in the future as this is a known side effect from his Nardil. Continue doxycycline for his acne. Try Metamucil to help make bowel movements more regular.  Stay hydrated. Follow-up in 6 months for a physical. The patient voiced understanding and agreement to the plan.  [place], DO 12/18/17  8:54  AM

## 2017-12-18 NOTE — Progress Notes (Signed)
Pre visit review using our clinic review tool, if applicable. No additional management support is needed unless otherwise documented below in the visit note. 

## 2017-12-18 NOTE — Patient Instructions (Addendum)
Continue doxy.  Try metamucil daily for the next 2 weeks to help regulate your stool.  Be mindful of your diet.  Stay hydrated.  Let us know if you need anything.

## 2017-12-21 DIAGNOSIS — M79644 Pain in right finger(s): Secondary | ICD-10-CM | POA: Diagnosis not present

## 2017-12-25 DIAGNOSIS — M79644 Pain in right finger(s): Secondary | ICD-10-CM | POA: Diagnosis not present

## 2017-12-25 MED FILL — VIT D2 1.25 MG (50,000 UNIT: 1.25 MG | 56 days supply | Qty: 8 | Fill #0

## 2017-12-25 MED FILL — CHLORDIAZEPOXIDE 5 MG CAP: 5 | 10 days supply | Qty: 30 | Fill #0 | Status: TO

## 2017-12-29 MED FILL — DOXYCYCLINE HYCLATE 100 MG: 100 | 30 days supply | Qty: 60 | Fill #1

## 2018-01-02 MED FILL — CEPHALEXIN 500 MG CAPSULE: 500 | 5 days supply | Qty: 20 | Fill #0

## 2018-01-04 DIAGNOSIS — M79644 Pain in right finger(s): Secondary | ICD-10-CM | POA: Diagnosis not present

## 2018-01-04 DIAGNOSIS — Z4789 Encounter for other orthopedic aftercare: Secondary | ICD-10-CM | POA: Diagnosis not present

## 2018-01-04 MED FILL — CHLORDIAZEPOXIDE 5 MG CAP: 5 | 10 days supply | Qty: 30 | Fill #0

## 2018-01-15 DIAGNOSIS — M79644 Pain in right finger(s): Secondary | ICD-10-CM | POA: Diagnosis not present

## 2018-01-15 DIAGNOSIS — Z4789 Encounter for other orthopedic aftercare: Secondary | ICD-10-CM | POA: Diagnosis not present

## 2018-01-18 MED FILL — PHENELZINE SULFATE 15 MG TA: 15 | 30 days supply | Qty: 180 | Fill #1

## 2018-01-19 DIAGNOSIS — F3181 Bipolar II disorder: Secondary | ICD-10-CM | POA: Diagnosis not present

## 2018-01-19 MED FILL — CHLORDIAZEPOXIDE 5 MG CAP: 5 | 12 days supply | Qty: 69 | Fill #0

## 2018-01-23 ENCOUNTER — Telehealth: Payer: Self-pay | Admitting: Family Medicine

## 2018-01-23 DIAGNOSIS — Z79899 Other long term (current) drug therapy: Secondary | ICD-10-CM | POA: Diagnosis not present

## 2018-01-23 DIAGNOSIS — L700 Acne vulgaris: Secondary | ICD-10-CM

## 2018-01-23 MED FILL — MYORISAN 40 MG CAPSULE: 40 | 30 days supply | Qty: 30 | Fill #0

## 2018-01-23 MED FILL — LITHIUM CARBONATE 300 MG CA: 300 | 30 days supply | Qty: 90 | Fill #0

## 2018-01-23 NOTE — Telephone Encounter (Signed)
Copied from [place] [id number]. Topic: Quick Communication - Rx Refill/Question >> Jan 23, 2018  8:26 AM Judyann Munson wrote: Medication: doxycycline (VIBRA-TABS) 100 MG tablet     Has the patient contacted their pharmacy?no   Preferred Pharmacy (with phone number or street name): [place], [place] - [place] [phone] (Phone) [phone] (Fax)     Agent: Please be advised that RX refills may take up to 3 business days. We ask that you follow-up with your pharmacy.

## 2018-01-24 MED ORDER — DOXYCYCLINE HYCLATE 100 MG PO TABS: 100.0000 mg | ORAL_TABLET | Freq: Two times a day (BID) | ORAL | 5 refills | Status: DC

## 2018-01-30 DIAGNOSIS — F3181 Bipolar II disorder: Secondary | ICD-10-CM | POA: Diagnosis not present

## 2018-01-31 DIAGNOSIS — M79644 Pain in right finger(s): Secondary | ICD-10-CM | POA: Diagnosis not present

## 2018-02-14 DIAGNOSIS — F3181 Bipolar II disorder: Secondary | ICD-10-CM | POA: Diagnosis not present

## 2018-02-15 MED FILL — PHENELZINE SULFATE 15 MG TA: 15 | 30 days supply | Qty: 180 | Fill #2

## 2018-02-15 MED FILL — CHLORDIAZEPOXIDE 5 MG CAP: 5 | 30 days supply | Qty: 60 | Fill #0

## 2018-02-21 DIAGNOSIS — M79644 Pain in right finger(s): Secondary | ICD-10-CM | POA: Diagnosis not present

## 2018-02-23 DIAGNOSIS — F3181 Bipolar II disorder: Secondary | ICD-10-CM | POA: Diagnosis not present

## 2018-02-23 MED FILL — TOPIRAMATE 25 MG TAB: 25 | 30 days supply | Qty: 60 | Fill #0

## 2018-02-23 MED FILL — LITHIUM CARBONATE 300 MG CA: 300 | 30 days supply | Qty: 90 | Fill #1

## 2018-02-23 MED FILL — VIT D2 1.25 MG (50,000 UNIT: 1.25 MG | 56 days supply | Qty: 8 | Fill #0

## 2018-02-26 DIAGNOSIS — Z4789 Encounter for other orthopedic aftercare: Secondary | ICD-10-CM | POA: Diagnosis not present

## 2018-02-26 DIAGNOSIS — M79644 Pain in right finger(s): Secondary | ICD-10-CM | POA: Diagnosis not present

## 2018-02-26 DIAGNOSIS — S62630D Displaced fracture of distal phalanx of right index finger, subsequent encounter for fracture with routine healing: Secondary | ICD-10-CM | POA: Diagnosis not present

## 2018-02-26 DIAGNOSIS — S62622D Displaced fracture of medial phalanx of right middle finger, subsequent encounter for fracture with routine healing: Secondary | ICD-10-CM | POA: Diagnosis not present

## 2018-02-27 DIAGNOSIS — Z79899 Other long term (current) drug therapy: Secondary | ICD-10-CM | POA: Diagnosis not present

## 2018-02-27 DIAGNOSIS — L700 Acne vulgaris: Secondary | ICD-10-CM | POA: Diagnosis not present

## 2018-02-27 MED FILL — MYORISAN 40 MG CAPSULE: 40 | 30 days supply | Qty: 60 | Fill #0

## 2018-03-02 MED FILL — TRANYLCYPROMINE SULF 10 MG: 10 | 30 days supply | Qty: 90 | Fill #0

## 2018-03-19 DIAGNOSIS — H5203 Hypermetropia, bilateral: Secondary | ICD-10-CM | POA: Diagnosis not present

## 2018-03-22 MED FILL — LITHIUM CARBONATE 300 MG CA: 300 | 30 days supply | Qty: 90 | Fill #2

## 2018-03-23 DIAGNOSIS — F3181 Bipolar II disorder: Secondary | ICD-10-CM | POA: Diagnosis not present

## 2018-03-23 MED FILL — TRANYLCYPROMINE SULF 10 MG: 10 | 30 days supply | Qty: 180 | Fill #0

## 2018-03-27 DIAGNOSIS — L853 Xerosis cutis: Secondary | ICD-10-CM | POA: Diagnosis not present

## 2018-03-27 DIAGNOSIS — L700 Acne vulgaris: Secondary | ICD-10-CM | POA: Diagnosis not present

## 2018-03-27 DIAGNOSIS — Z79899 Other long term (current) drug therapy: Secondary | ICD-10-CM | POA: Diagnosis not present

## 2018-04-04 ENCOUNTER — Encounter

## 2018-04-04 ENCOUNTER — Ambulatory Visit: Payer: 59 | Admitting: Family Medicine

## 2018-04-04 ENCOUNTER — Encounter: Payer: Self-pay | Admitting: Family Medicine

## 2018-04-04 VITALS — BP 124/70 | HR 81 | Temp 98.3°F | Ht 69.0 in | Wt 224.0 lb

## 2018-04-04 DIAGNOSIS — M545 Low back pain, unspecified: Secondary | ICD-10-CM

## 2018-04-04 MED ORDER — MELOXICAM 15 MG PO TABS: 15.0000 mg | ORAL_TABLET | Freq: Every day | ORAL | 0 refills | Status: DC

## 2018-04-04 MED FILL — MELOXICAM 15 MG TABLET: 15 | 30 days supply | Qty: 30 | Fill #0

## 2018-04-04 NOTE — Progress Notes (Signed)
Pre visit review using our clinic review tool, if applicable. No additional management support is needed unless otherwise documented below in the visit note. 

## 2018-04-04 NOTE — Progress Notes (Signed)
Musculoskeletal Exam  Patient: Albert [name] DOB: Nov 17, 1995  DOS: 04/04/2018  SUBJECTIVE:  Chief Complaint:   Chief Complaint  Patient presents with  . Back Pain  . Knee Pain    Albert [name] is a 22 y.o.  male for evaluation and treatment of his back pain.   Onset:  5 months ago. Would get lbp while running, no specific inj or change in activity.  Location: lower Character:  aching  Progression of issue:  Slightly better over past week Associated symptoms: none Denies bowel/bladder incontinence or weakness Treatment: to date has been OTC NSAIDS, home exercises and heat.   Neurovascular symptoms: no  ROS: Musculoskeletal/Extremities: +back pain Neurologic: no numbness, tingling no weakness   Past Medical History:  Diagnosis Date  . Anxiety with depression 09/30/2016  . History of PSVT (paroxysmal supraventricular tachycardia)     Objective:  VITAL SIGNS: BP 124/70 (BP Location: Left Arm, Patient Position: Sitting, Cuff Size: Large)   Pulse 81   Temp 98.3 F (36.8 C) (Oral)   Ht 5\' 9"  (1.753 m)   Wt 224 lb (101.6 kg)   SpO2 97%   BMI 33.08 kg/m  Constitutional: Well formed, well developed. No acute distress. HENT: Normocephalic, atraumatic.  Thorax & Lungs:  No accessory muscle use Extremities: No clubbing. No cyanosis. No edema.  Skin: Warm. Dry. No erythema. No rash.  Musculoskeletal: low back.   Tenderness to palpation: mild ttp over erector spinae msc group b/l Deformity: no Ecchymosis: no Straight leg test: negative for Poor hamstring flexibility b/l. +thomas test b/l Neurologic: Normal sensory function. No focal deficits noted. DTR's equal and symmetry in LE's. No clonus. Psychiatric: Normal mood. Age appropriate judgment and insight. Alert & oriented x 3.    Assessment:  Bilateral low back pain without sciatica, unspecified chronicity - Plan: meloxicam (MOBIC) 15 MG tablet  Plan: Orders as above. Stretches/exercises, heat, ice,  Tylenol. Sounds like Accutane could have been contributing? Will defer to derm team regarding dosing.  F/u prn. The patient voiced understanding and agreement to the plan.   [place], DO 04/04/18  2:00 PM

## 2018-04-04 NOTE — Patient Instructions (Signed)
Heat (pad or rice pillow in microwave) over affected area, 10-15 minutes twice daily.   OK to take Tylenol 1000 mg (2 extra strength tabs) or 975 mg (3 regular strength tabs) every 6 hours as needed.  EXERCISES  RANGE OF MOTION (ROM) AND STRETCHING EXERCISES - Low Back Pain Most people with lower back pain will find that their symptoms get worse with excessive bending forward (flexion) or arching at the lower back (extension). The exercises that will help resolve your symptoms will focus on the opposite motion.  If you have pain, numbness or tingling which travels down into your buttocks, leg or foot, the goal of the therapy is for these symptoms to move closer to your back and eventually resolve. Sometimes, these leg symptoms will get better, but your lower back pain may worsen. This is often an indication of progress in your rehabilitation. Be very alert to any changes in your symptoms and the activities in which you participated in the 24 hours prior to the change. Sharing this information with your caregiver will allow him or her to most efficiently treat your condition. These exercises may help you when beginning to rehabilitate your injury. Your symptoms may resolve with or without further involvement from your physician, physical therapist or athletic trainer. While completing these exercises, remember:   Restoring tissue flexibility helps normal motion to return to the joints. This allows healthier, less painful movement and activity.  An effective stretch should be held for at least 30 seconds.  A stretch should never be painful. You should only feel a gentle lengthening or release in the stretched tissue. FLEXION RANGE OF MOTION AND STRETCHING EXERCISES:  STRETCH - Flexion, Single Knee to Chest   Lie on a firm bed or floor with both legs extended in front of you.  Keeping one leg in contact with the floor, bring your opposite knee to your chest. Hold your leg in place by either  grabbing behind your thigh or at your knee.  Pull until you feel a gentle stretch in your low back. Hold 30 seconds.  Slowly release your grasp and repeat the exercise with the opposite side. Repeat 2 times. Complete this exercise 3 times per week.   STRETCH - Flexion, Double Knee to Chest  Lie on a firm bed or floor with both legs extended in front of you.  Keeping one leg in contact with the floor, bring your opposite knee to your chest.  Tense your stomach muscles to support your back and then lift your other knee to your chest. Hold your legs in place by either grabbing behind your thighs or at your knees.  Pull both knees toward your chest until you feel a gentle stretch in your low back. Hold 30 seconds.  Tense your stomach muscles and slowly return one leg at a time to the floor. Repeat 2 times. Complete this exercise 3 times per week.   STRETCH - Low Trunk Rotation  Lie on a firm bed or floor. Keeping your legs in front of you, bend your knees so they are both pointed toward the ceiling and your feet are flat on the floor.  Extend your arms out to the side. This will stabilize your upper body by keeping your shoulders in contact with the floor.  Gently and slowly drop both knees together to one side until you feel a gentle stretch in your low back. Hold for 30 seconds.  Tense your stomach muscles to support your lower back as you   bring your knees back to the starting position. Repeat the exercise to the other side. Repeat 2 times. Complete this exercise at least 3 times per week.   EXTENSION RANGE OF MOTION AND FLEXIBILITY EXERCISES:  STRETCH - Extension, Prone on Elbows   Lie on your stomach on the floor, a bed will be too soft. Place your palms about shoulder width apart and at the height of your head.  Place your elbows under your shoulders. If this is too painful, stack pillows under your chest.  Allow your body to relax so that your hips drop lower and make contact  more completely with the floor.  Hold this position for 30 seconds.  Slowly return to lying flat on the floor. Repeat 2 times. Complete this exercise 3 times per week.   RANGE OF MOTION - Extension, Prone Press Ups  Lie on your stomach on the floor, a bed will be too soft. Place your palms about shoulder width apart and at the height of your head.  Keeping your back as relaxed as possible, slowly straighten your elbows while keeping your hips on the floor. You may adjust the placement of your hands to maximize your comfort. As you gain motion, your hands will come more underneath your shoulders.  Hold this position 30 seconds.  Slowly return to lying flat on the floor. Repeat 2 times. Complete this exercise 3 times per week.   RANGE OF MOTION- Quadruped, Neutral Spine   Assume a hands and knees position on a firm surface. Keep your hands under your shoulders and your knees under your hips. You may place padding under your knees for comfort.  Drop your head and point your tailbone toward the ground below you. This will round out your lower back like an angry cat. Hold this position for 30 seconds.  Slowly lift your head and release your tail bone so that your back sags into a large arch, like an old horse.  Hold this position for 30 seconds.  Repeat this until you feel limber in your low back.  Now, find your "sweet spot." This will be the most comfortable position somewhere between the two previous positions. This is your neutral spine. Once you have found this position, tense your stomach muscles to support your low back.  Hold this position for 30 seconds. Repeat 2 times. Complete this exercise 3 times per week.   STRENGTHENING EXERCISES - Low Back Sprain These exercises may help you when beginning to rehabilitate your injury. These exercises should be done near your "sweet spot." This is the neutral, low-back arch, somewhere between fully rounded and fully arched, that is your  least painful position. When performed in this safe range of motion, these exercises can be used for people who have either a flexion or extension based injury. These exercises may resolve your symptoms with or without further involvement from your physician, physical therapist or athletic trainer. While completing these exercises, remember:   Muscles can gain both the endurance and the strength needed for everyday activities through controlled exercises.  Complete these exercises as instructed by your physician, physical therapist or athletic trainer. Increase the resistance and repetitions only as guided.  You may experience muscle soreness or fatigue, but the pain or discomfort you are trying to eliminate should never worsen during these exercises. If this pain does worsen, stop and make certain you are following the directions exactly. If the pain is still present after adjustments, discontinue the exercise until you can discuss  the trouble with your caregiver.  STRENGTHENING - Deep Abdominals, Pelvic Tilt   Lie on a firm bed or floor. Keeping your legs in front of you, bend your knees so they are both pointed toward the ceiling and your feet are flat on the floor.  Tense your lower abdominal muscles to press your low back into the floor. This motion will rotate your pelvis so that your tail bone is scooping upwards rather than pointing at your feet or into the floor. With a gentle tension and even breathing, hold this position for 3 seconds. Repeat 2 times. Complete this exercise 3 times per week.   STRENGTHENING - Abdominals, Crunches   Lie on a firm bed or floor. Keeping your legs in front of you, bend your knees so they are both pointed toward the ceiling and your feet are flat on the floor. Cross your arms over your chest.  Slightly tip your chin down without bending your neck.  Tense your abdominals and slowly lift your trunk high enough to just clear your shoulder blades. Lifting  higher can put excessive stress on the lower back and does not further strengthen your abdominal muscles.  Control your return to the starting position. Repeat 2 times. Complete this exercise 3 times per week.   STRENGTHENING - Quadruped, Opposite UE/LE Lift   Assume a hands and knees position on a firm surface. Keep your hands under your shoulders and your knees under your hips. You may place padding under your knees for comfort.  Find your neutral spine and gently tense your abdominal muscles so that you can maintain this position. Your shoulders and hips should form a rectangle that is parallel with the floor and is not twisted.  Keeping your trunk steady, lift your right hand no higher than your shoulder and then your left leg no higher than your hip. Make sure you are not holding your breath. Hold this position for 30 seconds.  Continuing to keep your abdominal muscles tense and your back steady, slowly return to your starting position. Repeat with the opposite arm and leg. Repeat 2 times. Complete this exercise 3 times per week.   STRENGTHENING - Abdominals and Quadriceps, Straight Leg Raise   Lie on a firm bed or floor with both legs extended in front of you.  Keeping one leg in contact with the floor, bend the other knee so that your foot can rest flat on the floor.  Find your neutral spine, and tense your abdominal muscles to maintain your spinal position throughout the exercise.  Slowly lift your straight leg off the floor about 6 inches for a count of 3, making sure to not hold your breath.  Still keeping your neutral spine, slowly lower your leg all the way to the floor. Repeat this exercise with each leg 2 times. Complete this exercise 3 times per week.  POSTURE AND BODY MECHANICS CONSIDERATIONS - Low Back Sprain Keeping correct posture when sitting, standing or completing your activities will reduce the stress put on different body tissues, allowing injured tissues a  chance to heal and limiting painful experiences. The following are general guidelines for improved posture.  While reading these guidelines, remember:  The exercises prescribed by your provider will help you have the flexibility and strength to maintain correct postures.  The correct posture provides the best environment for your joints to work. All of your joints have less wear and tear when properly supported by a spine with good posture. This means you  will experience a healthier, less painful body.  Correct posture must be practiced with all of your activities, especially prolonged sitting and standing. Correct posture is as important when doing repetitive low-stress activities (typing) as it is when doing a single heavy-load activity (lifting).  RESTING POSITIONS Consider which positions are most painful for you when choosing a resting position. If you have pain with flexion-based activities (sitting, bending, stooping, squatting), choose a position that allows you to rest in a less flexed posture. You would want to avoid curling into a fetal position on your side. If your pain worsens with extension-based activities (prolonged standing, working overhead), avoid resting in an extended position such as sleeping on your stomach. Most people will find more comfort when they rest with their spine in a more neutral position, neither too rounded nor too arched. Lying on a non-sagging bed on your side with a pillow between your knees, or on your back with a pillow under your knees will often provide some relief. Keep in mind, being in any one position for a prolonged period of time, no matter how correct your posture, can still lead to stiffness.  PROPER SITTING POSTURE In order to minimize stress and discomfort on your spine, you must sit with correct posture. Sitting with good posture should be effortless for a healthy body. Returning to good posture is a gradual process. Many people can work toward this  most comfortably by using various supports until they have the flexibility and strength to maintain this posture on their own. When sitting with proper posture, your ears will fall over your shoulders and your shoulders will fall over your hips. You should use the back of the chair to support your upper back. Your lower back will be in a neutral position, just slightly arched. You may place a small pillow or folded towel at the base of your lower back for  support.  When working at a desk, create an environment that supports good, upright posture. Without extra support, muscles tire, which leads to excessive strain on joints and other tissues. Keep these recommendations in mind:  CHAIR:  A chair should be able to slide under your desk when your back makes contact with the back of the chair. This allows you to work closely.  The chair's height should allow your eyes to be level with the upper part of your monitor and your hands to be slightly lower than your elbows.  BODY POSITION  Your feet should make contact with the floor. If this is not possible, use a foot rest.  Keep your ears over your shoulders. This will reduce stress on your neck and low back.  INCORRECT SITTING POSTURES  If you are feeling tired and unable to assume a healthy sitting posture, do not slouch or slump. This puts excessive strain on your back tissues, causing more damage and pain. Healthier options include:  Using more support, like a lumbar pillow.  Switching tasks to something that requires you to be upright or walking.  Talking a brief walk.  Lying down to rest in a neutral-spine position.  PROLONGED STANDING WHILE SLIGHTLY LEANING FORWARD  When completing a task that requires you to lean forward while standing in one place for a long time, place either foot up on a stationary 2-4 inch high object to help maintain the best posture. When both feet are on the ground, the lower back tends to lose its slight inward  curve. If this curve flattens (or becomes too  large), then the back and your other joints will experience too much stress, tire more quickly, and can cause pain.  CORRECT STANDING POSTURES Proper standing posture should be assumed with all daily activities, even if they only take a few moments, like when brushing your teeth. As in sitting, your ears should fall over your shoulders and your shoulders should fall over your hips. You should keep a slight tension in your abdominal muscles to brace your spine. Your tailbone should point down to the ground, not behind your body, resulting in an over-extended swayback posture.   INCORRECT STANDING POSTURES  Common incorrect standing postures include a forward head, locked knees and/or an excessive swayback. WALKING Walk with an upright posture. Your ears, shoulders and hips should all line-up.  PROLONGED ACTIVITY IN A FLEXED POSITION When completing a task that requires you to bend forward at your waist or lean over a low surface, try to find a way to stabilize 3 out of 4 of your limbs. You can place a hand or elbow on your thigh or rest a knee on the surface you are reaching across. This will provide you more stability, so that your muscles do not tire as quickly. By keeping your knees relaxed, or slightly bent, you will also reduce stress across your lower back. CORRECT LIFTING TECHNIQUES  DO :  Assume a wide stance. This will provide you more stability and the opportunity to get as close as possible to the object which you are lifting.  Tense your abdominals to brace your spine. Bend at the knees and hips. Keeping your back locked in a neutral-spine position, lift using your leg muscles. Lift with your legs, keeping your back straight.  Test the weight of unknown objects before attempting to lift them.  Try to keep your elbows locked down at your sides in order get the best strength from your shoulders when carrying an object.     Always ask  for help when lifting heavy or awkward objects. INCORRECT LIFTING TECHNIQUES DO NOT:   Lock your knees when lifting, even if it is a small object.  Bend and twist. Pivot at your feet or move your feet when needing to change directions.  Assume that you can safely pick up even a paperclip without proper posture.

## 2018-04-05 MED FILL — MYORISAN 40 MG CAPSULE: 40 | 30 days supply | Qty: 30 | Fill #0

## 2018-04-13 MED FILL — CHLORDIAZEPOXIDE 5 MG CAP: 5 | 30 days supply | Qty: 60 | Fill #1

## 2018-04-27 DIAGNOSIS — F3181 Bipolar II disorder: Secondary | ICD-10-CM | POA: Diagnosis not present

## 2018-04-27 DIAGNOSIS — E559 Vitamin D deficiency, unspecified: Secondary | ICD-10-CM | POA: Diagnosis not present

## 2018-04-27 MED FILL — TRANYLCYPROMINE SULF 10 MG: 10 | 30 days supply | Qty: 180 | Fill #0

## 2018-04-27 MED FILL — LITHIUM CARBONATE 300 MG CA: 300 | 90 days supply | Qty: 270 | Fill #0

## 2018-04-27 MED FILL — VIT D2 1.25 MG (50,000 UNIT: 1.25 MG | 56 days supply | Qty: 8 | Fill #0

## 2018-04-27 MED FILL — ARIPiprazole 2 MG TABS: 2 | 30 days supply | Qty: 30 | Fill #0

## 2018-05-01 DIAGNOSIS — L700 Acne vulgaris: Secondary | ICD-10-CM | POA: Diagnosis not present

## 2018-05-01 MED FILL — MYORISAN 40 MG CAPSULE: 40 | 30 days supply | Qty: 60 | Fill #0

## 2018-05-02 ENCOUNTER — Telehealth: Payer: Self-pay | Admitting: Family Medicine

## 2018-05-02 DIAGNOSIS — R635 Abnormal weight gain: Secondary | ICD-10-CM

## 2018-05-02 NOTE — Telephone Encounter (Signed)
That's fine TY

## 2018-05-02 NOTE — Telephone Encounter (Signed)
Copied from [place] [id number]. Topic: Referral - Request for Referral >> May 01, 2018  3:22 PM Yvette Rack wrote: Patient mother Lattie Haw stated that Dr Melrose Nakayama psychiatrist at [facility] suggested that patient speak with pcp for a referral to a nutritionist due to medication weight gain. Cb# [phone] Has patient seen PCP for this complaint? no *If NO, is insurance requiring patient see PCP for this issue before PCP can refer them? Referral for which specialty: nutritionist Preferred provider/office: no preferred provider Reason for referral:

## 2018-05-02 NOTE — Telephone Encounter (Signed)
Referral done

## 2018-05-21 ENCOUNTER — Ambulatory Visit: Payer: 59 | Admitting: Family Medicine

## 2018-05-21 ENCOUNTER — Encounter: Payer: Self-pay | Admitting: Family Medicine

## 2018-05-21 VITALS — BP 120/86 | HR 87 | Temp 97.6°F | Ht 69.0 in | Wt 218.5 lb

## 2018-05-21 DIAGNOSIS — Z114 Encounter for screening for human immunodeficiency virus [HIV]: Secondary | ICD-10-CM | POA: Diagnosis not present

## 2018-05-21 DIAGNOSIS — Z23 Encounter for immunization: Secondary | ICD-10-CM | POA: Diagnosis not present

## 2018-05-21 DIAGNOSIS — Z0000 Encounter for general adult medical examination without abnormal findings: Secondary | ICD-10-CM | POA: Diagnosis not present

## 2018-05-21 LAB — COMPREHENSIVE METABOLIC PANEL
ALT: 42 U/L (ref 0–53)
AST: 32 U/L (ref 0–37)
Albumin: 4.7 g/dL (ref 3.5–5.2)
Alkaline Phosphatase: 50 U/L (ref 39–117)
BUN: 10 mg/dL (ref 6–23)
CO2: 29 mEq/L (ref 19–32)
CREATININE: 1.06 mg/dL (ref 0.40–1.50)
Calcium: 10.1 mg/dL (ref 8.4–10.5)
Chloride: 104 mEq/L (ref 96–112)
GFR: 93.44 mL/min (ref >60.00)
Glucose, Bld: 80 mg/dL (ref 70–99)
Potassium: 4.8 mEq/L (ref 3.5–5.1)
SODIUM: 139 meq/L (ref 135–145)
Total Bilirubin: 0.5 mg/dL (ref 0.2–1.2)
Total Protein: 7.1 g/dL (ref 6.0–8.3)

## 2018-05-21 LAB — LIPID PANEL
Cholesterol: 140 mg/dL (ref 0–200)
HDL: 42.2 mg/dL (ref >39.00)
LDL Cholesterol: 69 mg/dL (ref 0–99)
NonHDL: 98.18
Total CHOL/HDL Ratio: 3
Triglycerides: 146 mg/dL (ref 0.0–149.0)
VLDL: 29.2 mg/dL (ref 0.0–40.0)

## 2018-05-21 MED ORDER — KETOCONAZOLE 2 % EX CREA: 1.0000 "application " | TOPICAL_CREAM | Freq: Every day | CUTANEOUS | 0 refills | Status: AC

## 2018-05-21 MED FILL — KETOCONAZOLE 2% CREAM: 2 | 15 days supply | Qty: 30 | Fill #0

## 2018-05-21 NOTE — Progress Notes (Signed)
Chief Complaint  Patient presents with  . Annual Exam    Well Male Albert [name] is here for a complete physical.   His last physical was >1 year ago.  Current diet: in general, a "healthy" diet.   Current exercise: running, floor exercises (push ups/crunches),  boxing Weight trend: stable Daytime fatigue? No. Seat belt? Yes.    Health maintenance Tetanus- No HIV- No  Past Medical History:  Diagnosis Date  . Anxiety with depression 09/30/2016  . History of PSVT (paroxysmal supraventricular tachycardia)      Past Surgical History:  Procedure Laterality Date  . NO PAST SURGERIES      Medications  Current Outpatient Medications on File Prior to Visit  Medication Sig Dispense Refill  . chlordiazePOXIDE (LIBRIUM) 10 MG capsule Take 10 mg by mouth daily.    . ISOtretinoin (ACCUTANE) 40 MG capsule Take 40 mg by mouth daily.    Marland Kitchen lithium 300 MG tablet Take 5 per day    . meloxicam (MOBIC) 15 MG tablet Take 1 tablet (15 mg total) by mouth daily. 30 tablet 0  . tranylcypromine (PARNATE) 10 MG tablet Take 60 mg by mouth daily.    . Vitamin D, Ergocalciferol, (DRISDOL) 1.25 MG (50000 UT) CAPS capsule Take 50,000 Units by mouth every 7 (seven) days.     Allergies Allergies  Allergen Reactions  . Decongestant [Oxymetazoline]     Allergic to all decongestants  . Sulfa Antibiotics     Family history of severe reactions    Family History Family History  Problem Relation Age of Onset  . Anxiety disorder Mother   . Sudden death Neg Hx   . Heart attack Neg Hx     Review of Systems: Constitutional: no fevers or chills Eye:  no recent significant change in vision Ear/Nose/Mouth/Throat:  Ears:  no tinnitus or hearing loss Nose/Mouth/Throat:  no complaints of nasal congestion, no sore throat Cardiovascular:  no chest pain, no palpitations Respiratory:  no cough and no shortness of breath Gastrointestinal:  no abdominal pain, no change in bowel habits GU:  Male: negative for  dysuria, frequency, and incontinence and negative for prostate symptoms Musculoskeletal/Extremities:  no pain, redness, or swelling of the joints Integumentary (Skin/Breast):  no abnormal skin lesions reported Neurologic:  no headaches, no numbness, tingling Endocrine: No unexpected weight changes Hematologic/Lymphatic:  no night sweats  Exam BP 120/86 (BP Location: Left Arm, Patient Position: Sitting, Cuff Size: Normal)   Pulse 87   Temp 97.6 F (36.4 C) (Oral)   Ht 5\' 9"  (1.753 m)   Wt 99.1 kg   SpO2 99%   BMI 32.27 kg/m  General:  well developed, well nourished, in no apparent distress Skin: erythema on R inguinal fold, some scaling; otherwise no significant moles, warts, or growths Head:  no masses, lesions, or tenderness Eyes:  pupils equal and round, sclera anicteric without injection Ears:  canals without lesions, TMs shiny without retraction, no obvious effusion, no erythema Nose:  nares patent, septum midline, mucosa normal Throat/Pharynx:  lips and gingiva without lesion; tongue and uvula midline; non-inflamed pharynx; no exudates or postnasal drainage Neck: neck supple without adenopathy, thyromegaly, or masses Lungs:  clear to auscultation, breath sounds equal bilaterally, no respiratory distress Cardio:  regular rate and rhythm, no bruits, no LE edema Abdomen:  abdomen soft, nontender; bowel sounds normal; no masses or organomegaly Genital (male): circumcised penis, no lesions or discharge; testes present bilaterally without masses or tenderness Rectal: Deferred Musculoskeletal:  symmetrical muscle  groups noted without atrophy or deformity Extremities:  no clubbing, cyanosis, or edema, no deformities, no skin discoloration Neuro:  gait normal; deep tendon reflexes normal and symmetric Psych: well oriented with normal range of affect and appropriate judgment/insight  Assessment and Plan  Well adult exam - Plan: Comprehensive metabolic panel, Lipid panel  Screening  for HIV (human immunodeficiency virus) - Plan: HIV Antibody (routine testing w rflx)   Well 22 y.o. male. Counseled on diet and exercise. Discussed importance of self testicular exams. Ketoconazole daily for 14 d for jock itch.  Other orders as above. Follow up in 1 year pending the above workup. The patient voiced understanding and agreement to the plan.  [place], DO 05/21/18 7:30 AM

## 2018-05-21 NOTE — Progress Notes (Signed)
Pre visit review using our clinic review tool, if applicable. No additional management support is needed unless otherwise documented below in the visit note. 

## 2018-05-21 NOTE — Patient Instructions (Signed)
Give us 2-3 business days to get the results of your labs back.   Keep the diet clean and stay active.  Do monthly self testicular checks in the shower. You are feeling for lumps/bumps that don't belong. If you feel anything like this, let me know!  Let us know if you need anything. 

## 2018-05-21 NOTE — Addendum Note (Signed)
Addended by: Sharon Seller B on: 05/21/2018 07:33 AM   Modules accepted: Orders

## 2018-05-22 LAB — HIV ANTIBODY (ROUTINE TESTING W REFLEX): HIV 1&2 Ab, 4th Generation: NONREACTIVE

## 2018-05-25 DIAGNOSIS — E559 Vitamin D deficiency, unspecified: Secondary | ICD-10-CM | POA: Diagnosis not present

## 2018-05-25 DIAGNOSIS — F3181 Bipolar II disorder: Secondary | ICD-10-CM | POA: Diagnosis not present

## 2018-05-25 MED FILL — CHLORDIAZEPOXIDE 5 MG CAP: 5 | 15 days supply | Qty: 30 | Fill #0

## 2018-05-25 MED FILL — ARIPiprazole 2 MG TABS: 2 | 30 days supply | Qty: 30 | Fill #0

## 2018-05-29 ENCOUNTER — Other Ambulatory Visit (INDEPENDENT_AMBULATORY_CARE_PROVIDER_SITE_OTHER): Payer: 59

## 2018-05-29 ENCOUNTER — Other Ambulatory Visit: Payer: Self-pay

## 2018-05-29 DIAGNOSIS — E559 Vitamin D deficiency, unspecified: Secondary | ICD-10-CM

## 2018-05-29 DIAGNOSIS — J0100 Acute maxillary sinusitis, unspecified: Secondary | ICD-10-CM | POA: Diagnosis not present

## 2018-05-29 LAB — VITAMIN D 25 HYDROXY (VIT D DEFICIENCY, FRACTURES): VITD: 47.23 ng/mL (ref 30.00–100.00)

## 2018-06-01 DIAGNOSIS — L308 Other specified dermatitis: Secondary | ICD-10-CM | POA: Diagnosis not present

## 2018-06-01 DIAGNOSIS — Z79899 Other long term (current) drug therapy: Secondary | ICD-10-CM | POA: Diagnosis not present

## 2018-06-01 DIAGNOSIS — L700 Acne vulgaris: Secondary | ICD-10-CM | POA: Diagnosis not present

## 2018-06-01 MED FILL — MYORISAN 40 MG CAPSULE: 40 | 30 days supply | Qty: 90 | Fill #0

## 2018-06-19 MED FILL — MARPLAN 10 MG TABS: 10 | 50 days supply | Qty: 200 | Fill #0

## 2018-07-03 DIAGNOSIS — L700 Acne vulgaris: Secondary | ICD-10-CM | POA: Diagnosis not present

## 2018-07-03 DIAGNOSIS — F3181 Bipolar II disorder: Secondary | ICD-10-CM | POA: Diagnosis not present

## 2018-07-03 DIAGNOSIS — L308 Other specified dermatitis: Secondary | ICD-10-CM | POA: Diagnosis not present

## 2018-07-03 DIAGNOSIS — Z79899 Other long term (current) drug therapy: Secondary | ICD-10-CM | POA: Diagnosis not present

## 2018-07-03 MED FILL — MYORISAN 40 MG CAPSULE: 40 | 30 days supply | Qty: 90 | Fill #0

## 2018-07-03 MED FILL — TRIAMCINOLONE 0.1% OINTMEN: 0.1 | 30 days supply | Qty: 30 | Fill #0

## 2018-07-03 MED FILL — LIOTHYRONINE SODIUM 25 MCG: 25 | 30 days supply | Qty: 30 | Fill #0

## 2018-07-20 DIAGNOSIS — F333 Major depressive disorder, recurrent, severe with psychotic symptoms: Secondary | ICD-10-CM | POA: Diagnosis not present

## 2018-07-20 MED FILL — PHENELZINE SULFATE 15 MG TA: 15 | 30 days supply | Qty: 120 | Fill #0

## 2018-07-20 MED FILL — LITHIUM CARBONATE 300 MG CA: 300 | 90 days supply | Qty: 270 | Fill #0

## 2018-07-25 MED FILL — CHLORDIAZEPOXIDE 5 MG CAP: 5 | 15 days supply | Qty: 30 | Fill #1

## 2018-07-27 MED FILL — MARPLAN 10 MG TABS: 10 | 30 days supply | Qty: 180 | Fill #0

## 2018-07-27 MED FILL — LIOTHYRONINE SODIUM 25 MCG: 25 | 30 days supply | Qty: 30 | Fill #1

## 2018-08-07 DIAGNOSIS — F411 Generalized anxiety disorder: Secondary | ICD-10-CM | POA: Diagnosis not present

## 2018-08-07 DIAGNOSIS — F331 Major depressive disorder, recurrent, moderate: Secondary | ICD-10-CM | POA: Diagnosis not present

## 2018-08-07 MED FILL — CHLORDIAZEPOXIDE 10 MG CAP: 10 | 90 days supply | Qty: 90 | Fill #0

## 2018-08-08 DIAGNOSIS — L700 Acne vulgaris: Secondary | ICD-10-CM | POA: Diagnosis not present

## 2018-08-08 MED FILL — MYORISAN 40 MG CAPSULE: 40 | 23 days supply | Qty: 90 | Fill #0

## 2018-08-10 MED FILL — PHENELZINE SULFATE 15 MG TA: 15 | 90 days supply | Qty: 270 | Fill #0

## 2018-08-27 MED FILL — LIOTHYRONINE SODIUM 25 MCG: 25 | 90 days supply | Qty: 90 | Fill #0

## 2018-08-30 DIAGNOSIS — L700 Acne vulgaris: Secondary | ICD-10-CM | POA: Diagnosis not present

## 2018-08-30 DIAGNOSIS — Z8659 Personal history of other mental and behavioral disorders: Secondary | ICD-10-CM | POA: Diagnosis not present

## 2018-08-30 MED FILL — MYORISAN 40 MG CAPSULE: 40 | 23 days supply | Qty: 90 | Fill #0

## 2018-09-04 DIAGNOSIS — F331 Major depressive disorder, recurrent, moderate: Secondary | ICD-10-CM | POA: Diagnosis not present

## 2018-09-18 DIAGNOSIS — E039 Hypothyroidism, unspecified: Secondary | ICD-10-CM | POA: Diagnosis not present

## 2018-09-18 DIAGNOSIS — R635 Abnormal weight gain: Secondary | ICD-10-CM | POA: Diagnosis not present

## 2018-09-18 DIAGNOSIS — L74519 Primary focal hyperhidrosis, unspecified: Secondary | ICD-10-CM | POA: Diagnosis not present

## 2018-09-24 DIAGNOSIS — Z79899 Other long term (current) drug therapy: Secondary | ICD-10-CM | POA: Diagnosis not present

## 2018-09-24 DIAGNOSIS — L700 Acne vulgaris: Secondary | ICD-10-CM | POA: Diagnosis not present

## 2018-09-24 MED FILL — MYORISAN 40 MG CAPSULE: 40 | 30 days supply | Qty: 90 | Fill #0

## 2018-09-25 ENCOUNTER — Other Ambulatory Visit: Payer: Self-pay | Admitting: Family Medicine

## 2018-09-25 DIAGNOSIS — Z79899 Other long term (current) drug therapy: Secondary | ICD-10-CM

## 2018-09-25 NOTE — Progress Notes (Signed)
ck

## 2018-09-26 ENCOUNTER — Other Ambulatory Visit (INDEPENDENT_AMBULATORY_CARE_PROVIDER_SITE_OTHER): Payer: 59

## 2018-09-26 ENCOUNTER — Other Ambulatory Visit: Payer: Self-pay

## 2018-09-26 DIAGNOSIS — Z79899 Other long term (current) drug therapy: Secondary | ICD-10-CM

## 2018-09-26 LAB — LIPID PANEL
Cholesterol: 175 mg/dL (ref 0–200)
HDL: 47.9 mg/dL (ref >39.00)
LDL Cholesterol: 93 mg/dL (ref 0–99)
NonHDL: 127.34
Total CHOL/HDL Ratio: 4
Triglycerides: 170 mg/dL — ABNORMAL HIGH (ref 0.0–149.0)
VLDL: 34 mg/dL (ref 0.0–40.0)

## 2018-09-26 LAB — HEPATIC FUNCTION PANEL
ALT: 46 U/L (ref 0–53)
AST: 42 U/L — ABNORMAL HIGH (ref 0–37)
Albumin: 4.5 g/dL (ref 3.5–5.2)
Alkaline Phosphatase: 69 U/L (ref 39–117)
Bilirubin, Direct: 0.1 mg/dL (ref 0.0–0.3)
Total Bilirubin: 0.5 mg/dL (ref 0.2–1.2)
Total Protein: 7.2 g/dL (ref 6.0–8.3)

## 2018-09-26 LAB — CK: Total CK: 181 U/L (ref 7–232)

## 2018-09-26 MED FILL — LEVOTHYROXINE 88 MCG TABLET: 88 | 30 days supply | Qty: 30 | Fill #0

## 2018-09-26 MED FILL — LIOTHYRONINE SODIUM 5 MCG T: 5 | 30 days supply | Qty: 30 | Fill #0

## 2018-10-12 DIAGNOSIS — R5382 Chronic fatigue, unspecified: Secondary | ICD-10-CM | POA: Diagnosis not present

## 2018-10-12 MED FILL — LITHIUM CARBONATE 300 MG CA: 300 | 90 days supply | Qty: 270 | Fill #0

## 2018-10-17 MED FILL — PHENELZINE SULFATE 15 MG TA: 15 | 30 days supply | Qty: 180 | Fill #0

## 2018-10-22 DIAGNOSIS — L700 Acne vulgaris: Secondary | ICD-10-CM | POA: Diagnosis not present

## 2018-10-25 DIAGNOSIS — R5382 Chronic fatigue, unspecified: Secondary | ICD-10-CM | POA: Diagnosis not present

## 2018-10-25 DIAGNOSIS — Z5181 Encounter for therapeutic drug level monitoring: Secondary | ICD-10-CM | POA: Diagnosis not present

## 2018-10-25 DIAGNOSIS — F411 Generalized anxiety disorder: Secondary | ICD-10-CM | POA: Diagnosis not present

## 2018-10-25 DIAGNOSIS — F331 Major depressive disorder, recurrent, moderate: Secondary | ICD-10-CM | POA: Diagnosis not present

## 2018-10-25 MED FILL — LEVOTHYROXINE 88 MCG TABLET: 88 | 30 days supply | Qty: 30 | Fill #1

## 2018-10-25 MED FILL — CLINDAMYCIN HCL 300 MG CAPS: 300 | 30 days supply | Qty: 60 | Fill #0

## 2018-10-25 MED FILL — rifAMPin 300 MG CAPS: 300 | 30 days supply | Qty: 60 | Fill #0

## 2018-10-25 MED FILL — LIOTHYRONINE SODIUM 5 MCG T: 5 | 30 days supply | Qty: 30 | Fill #1

## 2018-11-07 DIAGNOSIS — E039 Hypothyroidism, unspecified: Secondary | ICD-10-CM | POA: Diagnosis not present

## 2018-11-07 DIAGNOSIS — E236 Other disorders of pituitary gland: Secondary | ICD-10-CM | POA: Diagnosis not present

## 2018-11-22 DIAGNOSIS — E039 Hypothyroidism, unspecified: Secondary | ICD-10-CM | POA: Diagnosis not present

## 2018-11-22 DIAGNOSIS — R635 Abnormal weight gain: Secondary | ICD-10-CM | POA: Diagnosis not present

## 2018-11-26 DIAGNOSIS — R635 Abnormal weight gain: Secondary | ICD-10-CM | POA: Diagnosis not present

## 2018-11-26 MED FILL — LIOTHYRONINE SODIUM 5 MCG T: 5 | 30 days supply | Qty: 30 | Fill #2

## 2018-11-26 MED FILL — LEVOTHYROXINE 88 MCG TABLET: 88 | 30 days supply | Qty: 30 | Fill #2

## 2018-11-26 MED FILL — PHENELZINE SULFATE 15 MG TA: 15 | 30 days supply | Qty: 180 | Fill #1

## 2018-11-26 MED FILL — rifAMPin 300 MG CAPS: 300 | 30 days supply | Qty: 60 | Fill #1

## 2018-11-26 MED FILL — CLINDAMYCIN HCL 300 MG CAPS: 300 | 30 days supply | Qty: 60 | Fill #1

## 2018-12-24 MED FILL — LITHIUM CARBONATE 300 MG CA: 300 | 90 days supply | Qty: 270 | Fill #0

## 2018-12-24 MED FILL — rifAMPin 300 MG CAPS: 300 | 30 days supply | Qty: 60 | Fill #2

## 2018-12-24 MED FILL — LEVOTHYROXINE 88 MCG TABLET: 88 | 30 days supply | Qty: 30 | Fill #3

## 2018-12-24 MED FILL — CLINDAMYCIN HCL 300 MG CAPS: 300 | 30 days supply | Qty: 60 | Fill #2

## 2018-12-24 MED FILL — PHENELZINE SULFATE 15 MG TA: 15 | 30 days supply | Qty: 180 | Fill #2

## 2018-12-27 ENCOUNTER — Other Ambulatory Visit: Payer: Self-pay

## 2018-12-28 ENCOUNTER — Ambulatory Visit: Payer: 59 | Admitting: Family Medicine

## 2018-12-28 ENCOUNTER — Encounter: Payer: Self-pay | Admitting: Family Medicine

## 2018-12-28 VITALS — BP 122/72 | HR 97 | Temp 97.9°F | Ht 69.0 in | Wt 220.5 lb

## 2018-12-28 DIAGNOSIS — Z6832 Body mass index (BMI) 32.0-32.9, adult: Secondary | ICD-10-CM | POA: Diagnosis not present

## 2018-12-28 DIAGNOSIS — E669 Obesity, unspecified: Secondary | ICD-10-CM | POA: Diagnosis not present

## 2018-12-28 DIAGNOSIS — Z6835 Body mass index (BMI) 35.0-35.9, adult: Secondary | ICD-10-CM | POA: Insufficient documentation

## 2018-12-28 NOTE — Progress Notes (Signed)
Chief Complaint  Patient presents with  . Follow-up    Subjective: Patient is a 23 y.o. male here for f/u.  Pt sees psych and is on a handful of psych meds which caused him to gain weight. He has been eating relatively healthy and exercises and has not lost the weight he put on with the medications. He was placed on thyroid supp without relief despite nml levels. Testosterone was checked but it slightly low (or on lower end of normal). Endo who checked does not want to treat at this level. He is here to discuss options.   ROS: Endo: No wt loss  Past Medical History:  Diagnosis Date  . Anxiety with depression 09/30/2016  . History of PSVT (paroxysmal supraventricular tachycardia)     Objective: BP 122/72 (BP Location: Left Arm, Patient Position: Sitting, Cuff Size: Large)   Pulse 97   Temp 97.9 F (36.6 C) (Oral)   Ht 5\' 9"  (1.753 m)   Wt 220 lb 8 oz (100 kg)   SpO2 97%   BMI 32.56 kg/m  General: Awake, appears stated age Lungs: No accessory muscle use Psych: Age appropriate judgment and insight, normal affect and mood  Assessment and Plan: Class 1 obesity without serious comorbidity with body mass index (BMI) of 32.0 to 32.9 in adult, unspecified obesity type - Plan: Amb Ref to Medical Weight Management  Lyn Henri MD info given. I would like to see his T levels if he wants to see a local endo or urologist. Discussed the standard of care regarding T tx and how I usually do not initiate this in low normal or slightly low T levels.  Fu prn.  The patient voiced understanding and agreement to the plan.  [place], DO 12/28/18  2:24 PM

## 2018-12-28 NOTE — Patient Instructions (Addendum)
Hendricks MD [phone] [id number], [place], [place], [place] [address]  Let me see your testosterone levels if you are interested in seeing a urologist/endocrinogist as it might be a waste of time otherwise.   If you do not hear anything about your referral in the next 1-2 weeks, call our office and ask for an update.  Let us know if you need anything.

## 2019-01-12 DIAGNOSIS — Z20828 Contact with and (suspected) exposure to other viral communicable diseases: Secondary | ICD-10-CM | POA: Diagnosis not present

## 2019-01-21 DIAGNOSIS — L732 Hidradenitis suppurativa: Secondary | ICD-10-CM | POA: Diagnosis not present

## 2019-01-21 DIAGNOSIS — L700 Acne vulgaris: Secondary | ICD-10-CM | POA: Diagnosis not present

## 2019-01-21 MED FILL — GLYCOPYRROLATE 1 MG TABS: 1 | 30 days supply | Qty: 60 | Fill #0

## 2019-01-25 MED FILL — LEVOTHYROXINE 88 MCG TABLET: 88 | 30 days supply | Qty: 30 | Fill #4

## 2019-01-25 MED FILL — PHENELZINE SULFATE 15 MG TA: 15 | 90 days supply | Qty: 540 | Fill #0

## 2019-02-05 DIAGNOSIS — F331 Major depressive disorder, recurrent, moderate: Secondary | ICD-10-CM | POA: Diagnosis not present

## 2019-02-05 MED FILL — CHLORDIAZEPOXIDE 10 MG CAP: 10 | 90 days supply | Qty: 90 | Fill #0

## 2019-02-19 DIAGNOSIS — Z23 Encounter for immunization: Secondary | ICD-10-CM | POA: Diagnosis not present

## 2019-02-19 DIAGNOSIS — E039 Hypothyroidism, unspecified: Secondary | ICD-10-CM | POA: Diagnosis not present

## 2019-02-19 DIAGNOSIS — E291 Testicular hypofunction: Secondary | ICD-10-CM | POA: Diagnosis not present

## 2019-02-20 ENCOUNTER — Ambulatory Visit: Payer: 59 | Admitting: Family Medicine

## 2019-02-20 ENCOUNTER — Ambulatory Visit (HOSPITAL_BASED_OUTPATIENT_CLINIC_OR_DEPARTMENT_OTHER)
Admission: RE | Admit: 2019-02-20 | Discharge: 2019-02-20 | Disposition: A | Discharge status: Home / Self Care | Payer: 59 | Source: Ambulatory Visit | Attending: Family Medicine | Admitting: Family Medicine

## 2019-02-20 ENCOUNTER — Telehealth: Payer: Self-pay | Admitting: Family Medicine

## 2019-02-20 ENCOUNTER — Other Ambulatory Visit: Payer: Self-pay

## 2019-02-20 ENCOUNTER — Encounter: Payer: Self-pay | Admitting: Family Medicine

## 2019-02-20 VITALS — BP 127/85 | Ht 69.0 in | Wt 220.0 lb

## 2019-02-20 DIAGNOSIS — G8929 Other chronic pain: Secondary | ICD-10-CM

## 2019-02-20 DIAGNOSIS — M545 Low back pain: Secondary | ICD-10-CM | POA: Insufficient documentation

## 2019-02-20 DIAGNOSIS — M48061 Spinal stenosis, lumbar region without neurogenic claudication: Secondary | ICD-10-CM | POA: Diagnosis not present

## 2019-02-20 NOTE — Progress Notes (Signed)
Albert [name] - 23 y.o. male MRN [id number]  Date of birth: 07-13-1995  SUBJECTIVE:  Including CC & ROS.  Chief Complaint  Patient presents with  . Back Pain    midline low back x 2 months    Albert [name] is a 23 y.o. male that is presenting with acute on chronic low back pain.  This is occurring in the bilateral lower back.  Denies any inciting event or trigger.  He experiences the pain when he is walking with his backpack.  He has pain when he is running with no improvement when he stops to walk.  He does get improvement when he lies down on his back.  No radicular symptoms.  The pain can be mild to severe.  He has tried meloxicam with limited improvement.  Has not tried physical therapy.  Denies any previous surgeries.  No family history that contributes.  Pain is sharp and stabbing.  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  Has to sit for long periods of time.  Has not been working out like he normally does as the gyms were closed.   Review of Systems  Constitutional: Negative for fever.  HENT: Negative for congestion.   Respiratory: Negative for cough.   Cardiovascular: Negative for chest pain.  Gastrointestinal: Negative for abdominal pain.  Musculoskeletal: Positive for back pain.  Skin: Negative for color change.  Neurological: Positive for seizures. Negative for weakness.  Hematological: Negative for adenopathy.    HISTORY: Past Medical, Surgical, Social, and Family History Reviewed & Updated per EMR.   Pertinent Historical Findings include:  Past Medical History:  Diagnosis Date  . Anxiety with depression 09/30/2016  . History of PSVT (paroxysmal supraventricular tachycardia)     Past Surgical History:  Procedure Laterality Date  . NO PAST SURGERIES      Allergies  Allergen Reactions  . Decongestant [Oxymetazoline]     Allergic to all decongestants  . Sulfa Antibiotics     Family history of severe reactions    Family History  Problem  Relation Age of Onset  . Anxiety disorder Mother   . Sudden death Neg Hx   . Heart attack Neg Hx      Social History   Socioeconomic History  . Marital status: Single    Spouse name: Not on file  . Number of children: Not on file  . Years of education: Not on file  . Highest education level: Not on file  Occupational History  . Not on file  Social Needs  . Financial resource strain: Not on file  . Food insecurity    Worry: Not on file    Inability: Not on file  . Transportation needs    Medical: Not on file    Non-medical: Not on file  Tobacco Use  . Smoking status: Never Smoker  . Smokeless tobacco: Never Used  Substance and Sexual Activity  . Alcohol use: No  . Drug use: No  . Sexual activity: Not on file  Lifestyle  . Physical activity    Days per week: Not on file    Minutes per session: Not on file  . Stress: Not on file  Relationships  . Social Herbalist on phone: Not on file    Gets together: Not on file    Attends religious service: Not on file    Active member of club or organization: Not on file    Attends meetings of  clubs or organizations: Not on file    Relationship status: Not on file  . Intimate partner violence    Fear of current or ex partner: Not on file    Emotionally abused: Not on file    Physically abused: Not on file    Forced sexual activity: Not on file  Other Topics Concern  . Not on file  Social History Narrative  . Not on file     PHYSICAL EXAM:  VS: BP 127/85   Ht 5\' 9"  (1.753 m)   Wt 220 lb (99.8 kg)   BMI 32.49 kg/m  Physical Exam Gen: NAD, alert, cooperative with exam, well-appearing ENT: normal lips, normal nasal mucosa,  Eye: normal EOM, normal conjunctiva and lids CV:  no edema, +2 pedal pulses   Resp: no accessory muscle use, non-labored,   Skin: no rashes, no areas of induration  Neuro: normal tone, normal sensation to touch Psych:  normal insight, alert and oriented MSK:  Back: Normal flexion  and extension. Instability with one leg standing. Normal strength resistance with hip flexion, knee flexion extension, plantar flexion dorsiflexion. Normal internal and external rotation of the hips. Negative straight leg raise. Normal FADIR and FABER  NVI      ASSESSMENT & PLAN:   Chronic bilateral low back pain without sciatica Pain seems more spasm related and due to instability.  Does not appear to be radicular and less likely to be degenerative. -X-ray. -Counseled on home exercise therapy and supportive care. -Counseled to avoid sitting for long periods of time -If no improvement will consider physical therapy. Unclear if his psych medications are playing a role. Would discuss if he started any new of different meds associated with his symptoms.

## 2019-02-20 NOTE — Assessment & Plan Note (Signed)
Pain seems more spasm related and due to instability.  Does not appear to be radicular and less likely to be degenerative. -X-ray. -Counseled on home exercise therapy and supportive care. -Counseled to avoid sitting for long periods of time -If no improvement will consider physical therapy. Unclear if his psych medications are playing a role. Would discuss if he started any new of different meds associated with his symptoms.

## 2019-02-20 NOTE — Telephone Encounter (Signed)
Patient called for xray results.

## 2019-02-20 NOTE — Patient Instructions (Addendum)
Nice to meet you Please try heat on the lower back  Please try ice after exercise  Please try the stretches and exercises  Please try a thera cane  Please let me know if you want to try physical therapy  Please avoid sitting for prolonged periods of time.   I will call you with the results from today  Please send me a message in MyChart with any questions or updates.  Please see me back in 4-6 weeks.   --Dr. Raeford Razor

## 2019-02-21 MED FILL — LIOTHYRONINE SODIUM 5 MCG T: 5 | 90 days supply | Qty: 90 | Fill #0

## 2019-02-21 MED FILL — LEVOTHYROXINE 88 MCG TABLET: 88 | 90 days supply | Qty: 90 | Fill #0

## 2019-02-21 NOTE — Telephone Encounter (Signed)
Spoke with patient about results. Will place referral to PT for [facility].   Rosemarie Ax, MD [facility] 02/21/2019, 8:25 AM

## 2019-02-28 DIAGNOSIS — L74511 Primary focal hyperhidrosis, face: Secondary | ICD-10-CM | POA: Diagnosis not present

## 2019-02-28 DIAGNOSIS — M545 Low back pain: Secondary | ICD-10-CM | POA: Diagnosis not present

## 2019-02-28 MED FILL — GLYCOPYRROLATE 1 MG TABS: 1 | 30 days supply | Qty: 120 | Fill #0

## 2019-03-08 DIAGNOSIS — M545 Low back pain: Secondary | ICD-10-CM | POA: Diagnosis not present

## 2019-03-13 MED FILL — OXYBUTYNIN CHLORIDE 5 MG TA: 5 | 30 days supply | Qty: 60 | Fill #0

## 2019-03-15 DIAGNOSIS — M545 Low back pain: Secondary | ICD-10-CM | POA: Diagnosis not present

## 2019-03-18 MED FILL — LITHIUM CARBONATE 300 MG CA: 300 | 90 days supply | Qty: 360 | Fill #0

## 2019-03-20 ENCOUNTER — Other Ambulatory Visit: Payer: Self-pay

## 2019-03-20 ENCOUNTER — Encounter: Payer: Self-pay | Admitting: Family Medicine

## 2019-03-20 ENCOUNTER — Ambulatory Visit: Payer: 59 | Admitting: Family Medicine

## 2019-03-20 DIAGNOSIS — M545 Low back pain: Secondary | ICD-10-CM

## 2019-03-20 DIAGNOSIS — G8929 Other chronic pain: Secondary | ICD-10-CM | POA: Diagnosis not present

## 2019-03-20 NOTE — Progress Notes (Signed)
Albert [name] - 23 y.o. male MRN [id number]  Date of birth: 1995-09-25  SUBJECTIVE:  Including CC & ROS.  Chief Complaint  Patient presents with  . Follow-up    follow up for low back    Albert [name] is a 23 y.o. male that is following up for his low back pain.  He has been going through physical therapy and reports improvement of his pain.  It is currently mild and intermittent in nature.  He has switched to a standing desk.  He does not to wear a backpack on a regular basis.  He notices the pain with certain exercises such as lawn more rows.  Denies any radicular symptoms.  Pain still occurring in the lower back bilaterally.  Does notice some pain when he is running.   Review of Systems  Constitutional: Negative for fever.  HENT: Negative for congestion.   Respiratory: Negative for cough.   Cardiovascular: Negative for chest pain.  Gastrointestinal: Negative for abdominal pain.  Musculoskeletal: Positive for back pain.  Skin: Negative for color change.  Neurological: Negative for weakness.  Hematological: Negative for adenopathy.    HISTORY: Past Medical, Surgical, Social, and Family History Reviewed & Updated per EMR.   Pertinent Historical Findings include:  Past Medical History:  Diagnosis Date  . Anxiety with depression 09/30/2016  . History of PSVT (paroxysmal supraventricular tachycardia)     Past Surgical History:  Procedure Laterality Date  . NO PAST SURGERIES      Allergies  Allergen Reactions  . Decongestant [Oxymetazoline]     Allergic to all decongestants  . Sulfa Antibiotics     Family history of severe reactions    Family History  Problem Relation Age of Onset  . Anxiety disorder Mother   . Sudden death Neg Hx   . Heart attack Neg Hx      Social History   Socioeconomic History  . Marital status: Single    Spouse name: Not on file  . Number of children: Not on file  . Years of education: Not on file  . Highest education level: Not on  file  Occupational History  . Not on file  Social Needs  . Financial resource strain: Not on file  . Food insecurity    Worry: Not on file    Inability: Not on file  . Transportation needs    Medical: Not on file    Non-medical: Not on file  Tobacco Use  . Smoking status: Never Smoker  . Smokeless tobacco: Never Used  Substance and Sexual Activity  . Alcohol use: No  . Drug use: No  . Sexual activity: Not on file  Lifestyle  . Physical activity    Days per week: Not on file    Minutes per session: Not on file  . Stress: Not on file  Relationships  . Social Herbalist on phone: Not on file    Gets together: Not on file    Attends religious service: Not on file    Active member of club or organization: Not on file    Attends meetings of clubs or organizations: Not on file    Relationship status: Not on file  . Intimate partner violence    Fear of current or ex partner: Not on file    Emotionally abused: Not on file    Physically abused: Not on file    Forced sexual activity: Not on file  Other Topics Concern  .  Not on file  Social History Narrative  . Not on file     PHYSICAL EXAM:  VS: BP 132/87   Pulse 80   Ht 5\' 9"  (1.753 m)   Wt 225 lb (102.1 kg)   BMI 33.23 kg/m  Physical Exam Gen: NAD, alert, cooperative with exam, well-appearing ENT: normal lips, normal nasal mucosa,  Eye: normal EOM, normal conjunctiva and lids CV:  no edema, +2 pedal pulses   Resp: no accessory muscle use, non-labored,  Skin: no rashes, no areas of induration  Neuro: normal tone, normal sensation to touch Psych:  normal insight, alert and oriented MSK:  Back:  Normal flexion and extension  No midline TTP of the lumbar spine  Mild instability on the right with one leg standing  Instability with stepping down from stool  NVI       ASSESSMENT & PLAN:   Chronic bilateral low back pain without sciatica Having improvement with PT and standing desk. Likely muscular  still.  - continue PT  - counseled on HEP, supportive care and nutrition  - could consider MRI if no improvement.

## 2019-03-20 NOTE — Patient Instructions (Signed)
Good to see you Please try some hip abduction strengthening exercises   Please send me a message in MyChart with any questions or updates.  Please see me back in 2 months or as needed.   --Dr. Raeford Razor

## 2019-03-20 NOTE — Assessment & Plan Note (Signed)
Having improvement with PT and standing desk. Likely muscular still.  - continue PT  - counseled on HEP, supportive care and nutrition  - could consider MRI if no improvement.

## 2019-03-22 DIAGNOSIS — M545 Low back pain: Secondary | ICD-10-CM | POA: Diagnosis not present

## 2019-03-26 DIAGNOSIS — M545 Low back pain: Secondary | ICD-10-CM | POA: Diagnosis not present

## 2019-04-12 DIAGNOSIS — M545 Low back pain: Secondary | ICD-10-CM | POA: Diagnosis not present

## 2019-04-14 DIAGNOSIS — Z20828 Contact with and (suspected) exposure to other viral communicable diseases: Secondary | ICD-10-CM | POA: Diagnosis not present

## 2019-04-16 DIAGNOSIS — Z20828 Contact with and (suspected) exposure to other viral communicable diseases: Secondary | ICD-10-CM | POA: Diagnosis not present

## 2019-04-19 DIAGNOSIS — Z20828 Contact with and (suspected) exposure to other viral communicable diseases: Secondary | ICD-10-CM | POA: Diagnosis not present

## 2019-04-22 MED FILL — OXYBUTYNIN CHLORIDE 5 MG TA: 5 | 30 days supply | Qty: 60 | Fill #1

## 2019-04-24 MED FILL — PHENELZINE SULFATE 15 MG TA: 15 | 90 days supply | Qty: 540 | Fill #0

## 2019-05-07 DIAGNOSIS — F331 Major depressive disorder, recurrent, moderate: Secondary | ICD-10-CM | POA: Diagnosis not present

## 2019-05-07 DIAGNOSIS — F411 Generalized anxiety disorder: Secondary | ICD-10-CM | POA: Diagnosis not present

## 2019-05-13 DIAGNOSIS — F331 Major depressive disorder, recurrent, moderate: Secondary | ICD-10-CM | POA: Diagnosis not present

## 2019-05-23 MED FILL — LEVOTHYROXINE 88 MCG TABLET: 88 | 90 days supply | Qty: 90 | Fill #1

## 2019-05-23 MED FILL — LIOTHYRONINE SODIUM 5 MCG T: 5 | 90 days supply | Qty: 90 | Fill #1

## 2019-05-28 ENCOUNTER — Ambulatory Visit (INDEPENDENT_AMBULATORY_CARE_PROVIDER_SITE_OTHER): Payer: 59 | Admitting: Family Medicine

## 2019-05-28 ENCOUNTER — Other Ambulatory Visit: Payer: Self-pay

## 2019-05-28 ENCOUNTER — Encounter (INDEPENDENT_AMBULATORY_CARE_PROVIDER_SITE_OTHER): Payer: Self-pay | Admitting: Family Medicine

## 2019-05-28 VITALS — BP 124/79 | HR 92 | Temp 98.2°F | Ht 68.0 in | Wt 236.0 lb

## 2019-05-28 DIAGNOSIS — G8929 Other chronic pain: Secondary | ICD-10-CM

## 2019-05-28 DIAGNOSIS — E66812 Obesity, class 2: Secondary | ICD-10-CM

## 2019-05-28 DIAGNOSIS — R5383 Other fatigue: Secondary | ICD-10-CM | POA: Diagnosis not present

## 2019-05-28 DIAGNOSIS — F339 Major depressive disorder, recurrent, unspecified: Secondary | ICD-10-CM

## 2019-05-28 DIAGNOSIS — K5909 Other constipation: Secondary | ICD-10-CM | POA: Diagnosis not present

## 2019-05-28 DIAGNOSIS — R0602 Shortness of breath: Secondary | ICD-10-CM

## 2019-05-28 DIAGNOSIS — R61 Generalized hyperhidrosis: Secondary | ICD-10-CM

## 2019-05-28 DIAGNOSIS — M549 Dorsalgia, unspecified: Secondary | ICD-10-CM

## 2019-05-28 DIAGNOSIS — Z9189 Other specified personal risk factors, not elsewhere classified: Secondary | ICD-10-CM | POA: Diagnosis not present

## 2019-05-28 DIAGNOSIS — R1013 Epigastric pain: Secondary | ICD-10-CM | POA: Diagnosis not present

## 2019-05-28 DIAGNOSIS — E038 Other specified hypothyroidism: Secondary | ICD-10-CM

## 2019-05-28 DIAGNOSIS — Z0289 Encounter for other administrative examinations: Secondary | ICD-10-CM

## 2019-05-28 DIAGNOSIS — Z6836 Body mass index (BMI) 36.0-36.9, adult: Secondary | ICD-10-CM

## 2019-05-28 NOTE — Progress Notes (Deleted)
Dear ***,   Thank you for referring Albert [name] to our clinic. The following note includes my evaluation and treatment recommendations.  Subjective:   Chief Complaint: Albert [name] (MR# [id number]) is {a/an:23460} 24 y.o. male who presents for evaluation and treatment of Albert and related comorbidities. Current BMI is There is no height or weight on file to calculate BMI.Marland Kitchen Albert [name] has been struggling with his weight for many years and has been unsuccessful in either losing weight, maintaining weight loss, or reaching his healthy weight goal.  Albert [name] states he is currently in the action stage of change and ready to dedicate time achieving and maintaining a healthier weight. Albert [name] is interested in becoming our patient and working on intensive lifestyle modifications including (but not limited to) diet and exercise for weight loss.  Albert [name]'s habits were reviewed today and are as follows: {MWM WT HABITS:23461}.  Fatigue Albert [name] feels his energy is lower than it should be. This has worsened with weight gain and {HAS HAS NOT:18834} worsened recently. Albert [name] {Actions; denies/reports/admits to:19208} daytime somnolence and  {Actions; denies/reports/admits to:19208} waking up still tired. Patient is at risk for obstructive sleep apnea. Patent has a history of symptoms of {OSA Sx:17850}. Patient generally gets {numbers (fuzzy):14653} hours of sleep per night, and states they generally have {sleep quality:17851}. Snoring {is/are not:32546} present. Apneic episodes {is/are not:32546} present. Epworth Sleepiness Score is ***.  Dyspnea on exertion Albert [name] notes increasing shortness of breath with exercising and seems to be worsening over time with weight gain. He notes getting out of breath sooner with activity than he used to. This {HAS HAS CG:8705835 gotten worse recently. Albert [name] denies orthopnea.  Depression Screen Albert [name] (modified PHQ-9) score was  Depression screen  PHQ 2/9 09/30/2016  Decreased Interest 3  Down, Depressed, Hopeless 1  PHQ - 2 Score 4  Altered sleeping 3  Tired, decreased energy 3  Change in appetite 1  Feeling bad or failure about yourself  0  Trouble concentrating 3  Moving slowly or fidgety/restless 0  Suicidal thoughts 0  PHQ-9 Score 14    Assessment/Plan:   No diagnosis found.  Fatigue Hiep was informed that his fatigue may be related to Albert, depression or many other causes. Labs will be ordered, and in the meanwhile Albert [name] has agreed to work on diet, exercise and weight loss to help with fatigue. Proper sleep hygiene was discussed including the need for 7-8 hours of quality sleep each night. A sleep study {WAS/WAS NOT:[phone]::"was not"} ordered based on symptoms and Epworth score.  Dyspnea on exertion Kyian's shortness of breath appears to be Albert related and exercise induced. He has agreed to work on weight loss and gradually increase exercise to treat his exercise induced shortness of breath. If Albert [name] follows our instructions and loses weight without improvement of his shortness of breath, we will plan to refer to pulmonology. We will monitor this condition regularly. Albert [name] agrees to this plan.  There are no diagnoses linked to this encounter.  Depression Screen Albert [name] had a {mld/mod/mrk:311374} positive depression screening. Depression is commonly associated with Albert and often results in emotional eating behaviors. We will monitor this closely and work on CBT to help improve the non-hunger eating patterns. Referral to Psychology may be required if no improvement is seen as he continues in our clinic.  Albert Albert [name] {CHL AMB IS/IS [id number] currently in the action stage of change and his goal is to {MWMwtloss#1:[id number]}. I recommend Albert [name] begin the structured  treatment plan as follows:  He has agreed to {MWMwtlossportion/plan2:23431}.  We discussed the following exercise goals today: {MWM  EXERCISE RECS:23473}. We discussed the following behavioral modification strategies today: {MWMwtlossdietstrategies3:23432}.  He was informed of the importance of frequent follow-up visits to maximize his success with intensive lifestyle modifications for his multiple health conditions. He was informed we would discuss his lab results at his next visit unless there is a critical issue that needs to be addressed sooner. Albert [name] agreed to keep his next visit at the agreed upon time to discuss these results.  Objective:   There were no vitals taken for this visit. There is no height or weight on file to calculate BMI. EKG: {ekg findings:315101::"normal EKG, normal sinus rhythm","unchanged from previous tracings"}. Indirect Calorimeter completed today shows a VO2 of *** and a REE of ***.  His calculated basal metabolic rate is *** thus his basal metabolic rate is {DESC; [id number] than expected.  General: Cooperative, alert, well developed, in no acute distress. HEENT: Conjunctivae and lids unremarkable. Neck: No thyromegaly.  Cardiovascular: Regular rhythm.  Lungs: Normal work of breathing. Extremities: No edema.  Neurologic: No focal deficits.   Lab Results  Component Value Date   CREATININE 1.06 05/21/2018   BUN 10 05/21/2018   NA 139 05/21/2018   K 4.8 05/21/2018   CL 104 05/21/2018   CO2 29 05/21/2018   Lab Results  Component Value Date   ALT 46 09/26/2018   AST 42 (H) 09/26/2018   ALKPHOS 69 09/26/2018   BILITOT 0.5 09/26/2018   No results found for: HGBA1C No results found for: INSULIN No results found for: TSH Lab Results  Component Value Date   CHOL 175 09/26/2018   HDL 47.90 09/26/2018   LDLCALC 93 09/26/2018   TRIG 170.0 (H) 09/26/2018   CHOLHDL 4 09/26/2018   No results found for: WBC, HGB, HCT, MCV, PLT No results found for: IRON, TIBC, FERRITIN Albert Behavioral Intervention Visit Documentation for Insurance (15 Minutes):   ASK: We discussed the  diagnosis of Albert with Albert [name] today and Albert [name] agreed to give [name] permission to discuss Albert behavioral modification therapy today.  ASSESS: Albert [name] has the diagnosis of Albert and his BMI today is ***. Albert [name] {ACTION; IS/IS [id number] in the action stage of change.   ADVISE: Albert [name] was educated on the multiple health risks of Albert as well as the benefit of weight loss to improve his health. He was advised of the need for long term treatment and the importance of lifestyle modifications to improve his current health and to decrease his risk of future health problems.  AGREE: Multiple dietary modification options and treatment options were discussed and Shriyansh agreed to follow the recommendations documented in the above note.  ARRANGE: Kartier was educated on the importance of frequent visits to treat Albert as outlined per CMS and USPSTF guidelines and agreed to schedule his next follow up appointment today.  Attestation Statements:   Reviewed by clinician on day of visit: {MWMREVIEWED:23436::"allergies","medications","problem list","medical history","surgical history","family history","social history","previous encounter notes"}.  This visit occurred during the SARS-CoV-2 public health emergency.  Safety protocols were in place, including screening questions prior to the visit, additional usage of staff PPE, and extensive cleaning of exam room while observing appropriate contact time as indicated for disinfecting solutions. (CPT [id number])  I, ***, am acting as transcriptionist for ***.  I have reviewed the above documentation for accuracy and completeness, and I agree with the above. - ***

## 2019-05-29 LAB — INSULIN, RANDOM: INSULIN: 19 u[IU]/mL (ref 2.6–24.9)

## 2019-05-29 LAB — CBC WITH DIFFERENTIAL/PLATELET
Basophils Absolute: 0.1 10*3/uL (ref 0.0–0.2)
Basos: 1 %
EOS (ABSOLUTE): 0.2 10*3/uL (ref 0.0–0.4)
Eos: 3 %
Hematocrit: 45.7 % (ref 37.5–51.0)
Hemoglobin: 15.6 g/dL (ref 13.0–17.7)
Immature Grans (Abs): 0.2 10*3/uL — ABNORMAL HIGH (ref 0.0–0.1)
Immature Granulocytes: 2 %
Lymphocytes Absolute: 1.4 10*3/uL (ref 0.7–3.1)
Lymphs: 20 %
MCH: 30.1 pg (ref 26.6–33.0)
MCHC: 34.1 g/dL (ref 31.5–35.7)
MCV: 88 fL (ref 79–97)
Monocytes Absolute: 0.6 10*3/uL (ref 0.1–0.9)
Monocytes: 9 %
Neutrophils Absolute: 4.5 10*3/uL (ref 1.4–7.0)
Neutrophils: 65 %
Platelets: 266 10*3/uL (ref 150–450)
RBC: 5.18 x10E6/uL (ref 4.14–5.80)
RDW: 13.1 % (ref 11.6–15.4)
WBC: 7.1 10*3/uL (ref 3.4–10.8)

## 2019-05-29 LAB — COMPREHENSIVE METABOLIC PANEL
ALT: 57 IU/L — ABNORMAL HIGH (ref 0–44)
AST: 46 IU/L — ABNORMAL HIGH (ref 0–40)
Albumin/Globulin Ratio: 2 (ref 1.2–2.2)
Albumin: 5 g/dL (ref 4.1–5.2)
Alkaline Phosphatase: 71 IU/L (ref 39–117)
BUN/Creatinine Ratio: 15 (ref 9–20)
BUN: 14 mg/dL (ref 6–20)
Bilirubin Total: 0.3 mg/dL (ref 0.0–1.2)
CO2: 24 mmol/L (ref 20–29)
Calcium: 10 mg/dL (ref 8.7–10.2)
Chloride: 104 mmol/L (ref 96–106)
Creatinine, Ser: 0.94 mg/dL (ref 0.76–1.27)
GFR calc Af Amer: 133 mL/min/{1.73_m2} (ref >59)
GFR calc non Af Amer: 115 mL/min/{1.73_m2} (ref >59)
Globulin, Total: 2.5 g/dL (ref 1.5–4.5)
Glucose: 86 mg/dL (ref 65–99)
Potassium: 4.6 mmol/L (ref 3.5–5.2)
Sodium: 144 mmol/L (ref 134–144)
Total Protein: 7.5 g/dL (ref 6.0–8.5)

## 2019-05-29 LAB — LIPID PANEL WITH LDL/HDL RATIO
Cholesterol, Total: 137 mg/dL (ref 100–199)
HDL: 51 mg/dL (ref >39)
LDL Chol Calc (NIH): 70 mg/dL (ref 0–99)
LDL/HDL Ratio: 1.4 ratio (ref 0.0–3.6)
Triglycerides: 83 mg/dL (ref 0–149)
VLDL Cholesterol Cal: 16 mg/dL (ref 5–40)

## 2019-05-29 LAB — T4, FREE: Free T4: 1.24 ng/dL (ref 0.82–1.77)

## 2019-05-29 LAB — TSH: TSH: 0.559 u[IU]/mL (ref 0.450–4.500)

## 2019-05-29 LAB — VITAMIN D 25 HYDROXY (VIT D DEFICIENCY, FRACTURES): Vit D, 25-Hydroxy: 49.7 ng/mL (ref 30.0–100.0)

## 2019-05-29 LAB — HEMOGLOBIN A1C
Est. average glucose Bld gHb Est-mCnc: 91 mg/dL
Hgb A1c MFr Bld: 4.8 % (ref 4.8–5.6)

## 2019-05-29 LAB — T3: T3, Total: 151 ng/dL (ref 71–180)

## 2019-05-29 LAB — VITAMIN B12: Vitamin B-12: 323 pg/mL (ref 232–1245)

## 2019-05-29 NOTE — Progress Notes (Signed)
Dear Albert Sheer, DO,   Thank you for referring Albert [name] to our clinic. The following note includes my evaluation and treatment recommendations.  Chief Complaint:   Class 2 severe obesity with serious comorbidity and body mass index (BMI) of 36.0 to 36.9 in adult, unspecified obesity type [facility]) Albert [name] (MR# [id number]) is a 24 y.o. male who presents for evaluation and treatment of obesity and related comorbidities. Current BMI is Body mass index is 35.88 kg/m.Albert [name] has been struggling with Albert [name] weight for many years and has been unsuccessful in either losing weight, maintaining weight loss, or reaching Albert [name] healthy weight goal.  Albert [name] states he is currently in the action stage of change and ready to dedicate time achieving and maintaining a healthier weight. Albert [name] is interested in becoming our patient and working on intensive lifestyle modifications including (but not limited to) diet and exercise for weight loss.  Albert [name]'s habits were reviewed today and are as follows: Albert [name] family eats meals together, he struggles with family and or coworkers weight loss sabotage, Albert [name] desired weight loss is 56 lbs, he started gaining weight in Spring 2019, Albert [name] heaviest weight ever was 235 pounds, he has significant food cravings issues, he skips meals frequently, he is frequently drinking liquids with calories and he frequently makes poor food choices.  Subjective:   1. Other fatigue Albert [name] feels Albert [name] energy is lower than it should be. This has worsened with weight gain and has worsened recently. Albert [name] denies daytime somnolence and admits to waking up still tired. Patient is at risk for obstructive sleep apnea. Patent has a history of symptoms of daytime fatigue and morning headache. Patient generally gets 6 hours of sleep per night, and states they generally do not feel rested in the morning. Snoring is not present. Apneic episodes are not present. Epworth Sleepiness Score is  2.  2. Shortness of breath on exertion Albert [name] notes increasing shortness of breath with exercising and seems to be worsening over time with weight gain. He notes getting out of breath sooner with activity than he used to. This has gotten worse recently. Albert [name] denies orthopnea.  3. Other specified hypothyroidism Patient is taking thyroid supplementation to counteract the lithium effects. Thyroid labs have not been checked recently.  Will check today.  4. Chronic back pain, unspecified back location, unspecified back pain laterality Stable, fairly controlled back pain. Will restart PT soon.   5. Dyspepsia Likely induced by medication and weight gain. Gastrointestinal ROS: no abdominal pain, change in bowel habits, or black or bloody stools.  6. Other constipation Medication induced.   7. Hyperhidrosis Seeing dermatology for this. Current medication is Oxybutynin.   8. Recurrent major depressive disorder, remission status unspecified (Albert [name]) Patient on several medications and has gained 90 pounds since 2019 due to medications.  Sees Psychiatry at [facility].  Depression Screen Albert [name]'s Food and Mood (modified PHQ-9) score was 14. Depression screen PHQ 2/9 05/28/2019  Decreased Interest 3  Down, Depressed, Hopeless 3  PHQ - 2 Score 6  Altered sleeping 1  Tired, decreased energy 2  Change in appetite 3  Feeling bad or failure about yourself  1  Trouble concentrating 1  Moving slowly or fidgety/restless 0  Suicidal thoughts 0  PHQ-9 Score 14  Difficult doing work/chores Very difficult   Assessment/Plan:     ICD-10-CM   1. Other fatigue  R53.83 EKG 12-Lead    B12    Vitamin D (25 hydroxy)    HgB A1c  Insulin, random    Comprehensive Metabolic Panel (CMET)    CBC w/Diff/Platelet  2. Shortness of breath on exertion  R06.02 Lipid Panel With LDL/HDL Ratio  3. Other specified hypothyroidism  E03.8 T3    T4, free    TSH  4. Chronic back pain, unspecified back location, unspecified  back pain laterality  M54.9    G89.29   5. Dyspepsia  R10.13   6. Other constipation  K59.09   7. Hyperhidrosis  R61   8. Recurrent major depressive disorder, remission status unspecified ([name])  F33.9   9. At risk for impaired function of liver  Z91.89   10. Class 2 severe obesity with serious comorbidity and body mass index (BMI) of 36.0 to 36.9 in adult, unspecified obesity type (Albert [name])  E66.01    Z68.36    1. Other fatigue [name] was informed that Albert [name] fatigue may be related to obesity, depression or many other causes. Labs will be ordered, and in the meanwhile Albert [name] has agreed to work on diet, exercise and weight loss to help with fatigue. Proper sleep hygiene was discussed including the need for 7-8 hours of quality sleep each night. A sleep study was not ordered based on symptoms and Epworth score. - EKG 12-Lead - B12 - Vitamin D (25 hydroxy) - HgB A1c - Insulin, random - Comprehensive Metabolic Panel (CMET) - CBC w/Diff/Platelet  2. Shortness of breath on exertion Albert [name]'s shortness of breath appears to be obesity related and exercise induced.  He has agreed to work on weight loss and gradually increase exercise to treat Albert [name] exercise induced shortness of breath. If Albert [name] follows our instructions and loses weight without improvement of Albert [name] shortness of breath, we will plan to refer to pulmonology. We will monitor this condition regularly. Albert [name] agrees to this plan. - Lipid Panel With LDL/HDL Ratio  3. Other specified hypothyroidism This problem is medication induced.  I will continue to monitor. - T3 - T4, free - TSH  4. Chronic back pain, unspecified back location, unspecified back pain laterality Albert [name]'s back pain is stable status post physical therapy.    5. Dyspepsia Obesity and several of the medications Ahmed is taking can contribute to Albert [name] dyspepsia.  Counseling: Intensive lifestyle modifications are the first line treatment for this issue. We discussed several  lifestyle modifications today and he will continue to work on diet, exercise and weight loss efforts.  6. Other constipation Kyle's constipation seems to be medication induced.  Will monitor.  7. Hyperhidrosis Wael sees Dermatology, Dr. Ronnald Ramp, for this.  Will monitor.   8. Recurrent major depressive disorder, remission status unspecified ([place]) Davell had a markedly positive depression screening. Depression is commonly associated with obesity and often results in emotional eating behaviors. We will monitor this closely and work on CBT to help improve the non-hunger eating patterns. Referral to Psychology may be required if no improvement is seen as he continues in our clinic.  9. At risk for impaired function of liver The patient is taking several medications that have the potential to damage Albert [name] liver over time.  Will continue to monitor liver function.   10. Class 2 severe obesity with serious comorbidity and body mass index (BMI) of 36.0 to 36.9 in adult, unspecified obesity type [facility]) Lacorey is currently in the action stage of change and Albert [name] goal is to continue with weight loss efforts. I recommend Hooman begin the structured treatment plan as follows:  He has agreed to Category 4 Plan.  We discussed the  following exercise goals today: For substantial health benefits, adults should do at least 150 minutes (2 hours and 30 minutes) a week of moderate-intensity, or 75 minutes (1 hour and 15 minutes) a week of vigorous-intensity aerobic physical activity, or an equivalent combination of moderate- and vigorous-intensity aerobic activity. Aerobic activity should be performed in episodes of at least 10 minutes, and preferably, it should be spread throughout the week. Adults should also include muscle-strengthening activities that involve all major muscle groups on 2 or more days a week.   We discussed the following behavioral modification strategies today: increasing lean protein intake,  decreasing simple carbohydrates, increasing vegetables, increasing water intake, decreasing liquid calories and better snacking choices.  He was informed of the importance of frequent follow-up visits to maximize Albert [name] success with intensive lifestyle modifications for Albert [name] multiple health conditions. He was informed we would discuss Albert [name] lab results at Albert [name] next visit unless there is a critical issue that needs to be addressed sooner. Bernarr agreed to keep Albert [name] next visit at the agreed upon time to discuss these results.  Objective:   Blood pressure 124/79, pulse 92, temperature 98.2 F (36.8 C), temperature source Oral, height 5\' 8"  (1.727 m), weight 236 lb (107 kg), SpO2 97 %. Body mass index is 35.88 kg/m. EKG: normal EKG, normal sinus rhythm, 89 BPM.  Indirect Calorimeter completed today shows a VO2 of 350 and a REE of 2437.  Albert [name] calculated basal metabolic rate is [id number] thus Albert [name] basal metabolic rate is better than expected.  General: Cooperative, alert, well developed, in no acute distress. HEENT: Conjunctivae and lids unremarkable. Neck: No thyromegaly.  Cardiovascular: Regular rhythm.  Lungs: Normal work of breathing. Extremities: No edema.  Neurologic: No focal deficits.   Lab Results  Component Value Date   CREATININE 0.94 05/28/2019   BUN 14 05/28/2019   NA 144 05/28/2019   K 4.6 05/28/2019   CL 104 05/28/2019   CO2 24 05/28/2019   Lab Results  Component Value Date   ALT 57 (H) 05/28/2019   AST 46 (H) 05/28/2019   ALKPHOS 71 05/28/2019   BILITOT 0.3 05/28/2019   Lab Results  Component Value Date   HGBA1C 4.8 05/28/2019   Lab Results  Component Value Date   INSULIN WILL FOLLOW 05/28/2019   Lab Results  Component Value Date   TSH 0.559 05/28/2019   Lab Results  Component Value Date   CHOL 137 05/28/2019   HDL 51 05/28/2019   LDLCALC 70 05/28/2019   TRIG 83 05/28/2019   CHOLHDL 4 09/26/2018   Lab Results  Component Value Date   WBC 7.1 05/28/2019   HGB 15.6  05/28/2019   HCT 45.7 05/28/2019   MCV 88 05/28/2019   PLT 266 05/28/2019   No results found for: IRON, TIBC, FERRITIN  Attestation Statements:   Reviewed by clinician on day of visit: allergies, medications, problem list, medical history, surgical history, family history, social history and previous encounter notes.  This visit occurred during the SARS-CoV-2 public health emergency.  Safety protocols were in place, including screening questions prior to the visit, additional usage of staff PPE, and extensive cleaning of exam room while observing appropriate contact time as indicated for disinfecting solutions. (CPT [id number])  I, Water quality scientist, CMA, am acting as transcriptionist for [employer], DO.  I have reviewed the above documentation for accuracy and completeness, and I agree with the above. Briscoe Deutscher, DO

## 2019-06-06 ENCOUNTER — Ambulatory Visit: Payer: 59 | Attending: Internal Medicine

## 2019-06-06 DIAGNOSIS — Z20822 Contact with and (suspected) exposure to covid-19: Secondary | ICD-10-CM | POA: Diagnosis not present

## 2019-06-06 MED FILL — OXYBUTYNIN CHLORIDE 5 MG TA: 5 | 30 days supply | Qty: 60 | Fill #2

## 2019-06-07 LAB — NOVEL CORONAVIRUS, NAA: SARS-CoV-2, NAA: NOT DETECTED

## 2019-06-11 ENCOUNTER — Ambulatory Visit (INDEPENDENT_AMBULATORY_CARE_PROVIDER_SITE_OTHER): Payer: 59 | Admitting: Bariatrics

## 2019-06-11 ENCOUNTER — Other Ambulatory Visit: Payer: Self-pay

## 2019-06-11 ENCOUNTER — Encounter (INDEPENDENT_AMBULATORY_CARE_PROVIDER_SITE_OTHER): Payer: Self-pay | Admitting: Bariatrics

## 2019-06-11 VITALS — BP 115/77 | HR 85 | Temp 98.0°F | Ht 68.0 in | Wt 230.0 lb

## 2019-06-11 DIAGNOSIS — R7989 Other specified abnormal findings of blood chemistry: Secondary | ICD-10-CM | POA: Diagnosis not present

## 2019-06-11 DIAGNOSIS — E8881 Metabolic syndrome: Secondary | ICD-10-CM

## 2019-06-11 DIAGNOSIS — Z6835 Body mass index (BMI) 35.0-35.9, adult: Secondary | ICD-10-CM

## 2019-06-11 DIAGNOSIS — Z9189 Other specified personal risk factors, not elsewhere classified: Secondary | ICD-10-CM

## 2019-06-11 MED ORDER — METFORMIN HCL 500 MG PO TABS: 500.0000 mg | ORAL_TABLET | Freq: Every day | ORAL | 0 refills | Status: DC

## 2019-06-11 MED FILL — LITHIUM CARBONATE 300 MG CA: 300 | 90 days supply | Qty: 450 | Fill #0

## 2019-06-11 MED FILL — metFORMIN HCL 500 MG TABS: 500 | 30 days supply | Qty: 30 | Fill #0

## 2019-06-12 DIAGNOSIS — E8881 Metabolic syndrome: Secondary | ICD-10-CM | POA: Insufficient documentation

## 2019-06-12 NOTE — Progress Notes (Signed)
Chief Complaint:   OBESITY Albert [name] is here to discuss his progress with his obesity treatment plan along with follow-up of his obesity related diagnoses. Albert [name] is on the Category 4 Plan and states he is following his eating plan approximately 90-95% of the time. Albert [name] states he is walking and weightlifting 60 minutes 6 times a week.  Today's visit was #: 2 Starting weight: 236 lbs Starting date: 05/28/2019 Today's weight: 230 lbs Today's date: 06/11/2019 Total lbs lost to date: 6 Total lbs lost since last in-office visit: 6  Interim History: Albert [name] is down 6 lbs since his last visit. He ate a salad with grilled chicken with a vinaigrette dressing. He does not have a kitchen at his apartment. He reports getting minimal water intake.  Subjective:   Insulin resistance. Albert [name] has a diagnosis of insulin resistance based on his elevated fasting insulin level >5. He continues to work on diet and exercise to decrease his risk of diabetes. He is taking lithium and Nardil.  Lab Results  Component Value Date   INSULIN 19.0 05/28/2019   Lab Results  Component Value Date   HGBA1C 4.8 05/28/2019   Elevated LFTs. Albert [name] has a new dx of elevated ALT. His BMI is over 40. He denies abdominal pain or jaundice and has never been told of any liver problems in the past. He denies excessive alcohol intake. Last AST 46 on 05/28/2019 with an ALT of 57.   At risk for diabetes mellitus. Albert [name] is at higher than average risk for developing diabetes due to insulin resistance and obesity.   Assessment/Plan:   Insulin resistance. Albert [name] will continue to work on weight loss, exercise, increasing protein and healthy fats, and decreasing simple carbohydrates to help decrease the risk of diabetes. Albert [name] agreed to follow-up with [name] as directed to closely monitor his progress. He was given a prescription for metFORMIN (GLUCOPHAGE) 500 MG tablet 1 PO daily a.m. with food #30 with 0 refills (ultimately  500 mg BID).  Elevated LFTs. We discussed the likely diagnosis of non-alcoholic fatty liver disease today and how this condition is obesity related. Drayson was educated the importance of weight loss. Albert [name] agreed to continue with his weight loss efforts with healthier diet and exercise as an essential part of his treatment plan. He was advised that to have an impact on his NAFLD, he would need to lose 5-10% of his weight. He will do resistance and cardio 150 minutes per week or greater.  At risk for diabetes mellitus. Albert [name] was given approximately 15 minutes of diabetes education and counseling today. We discussed intensive lifestyle modifications today with an emphasis on weight loss as well as increasing exercise and decreasing simple carbohydrates in his diet. We also reviewed medication options with an emphasis on risk versus benefit of those discussed.   Repetitive spaced learning was employed today to elicit superior memory formation and behavioral change.  Class 2 severe obesity with serious comorbidity and body mass index (BMI) of 35.0 to 35.9 in adult, unspecified obesity type (Albert [name]).  Albert [name] is currently in the action stage of change. As such, his goal is to continue with weight loss efforts. He has agreed to the Category 4 Plan.   He will work on meal planning and intentional eating.  Labs were reviewed including CMP, lipids, Vitamin D, and B12.  Additional lunch options were given.  Exercise goals: Albert [name] will walk and lift weights most days per week and stretching exercises.  Behavioral modification  strategies: increasing lean protein intake, decreasing simple carbohydrates, increasing vegetables, increasing water intake, decreasing eating out, no skipping meals, meal planning and cooking strategies, keeping healthy foods in the home and planning for success.  Jaquain has agreed to follow-up with our clinic in 2 weeks. He was informed of the importance of frequent follow-up visits  to maximize his success with intensive lifestyle modifications for his multiple health conditions.   Objective:   Blood pressure 115/77, pulse 85, temperature 98 F (36.7 C), temperature source Oral, height 5\' 8"  (1.727 m), weight 230 lb (104.3 kg), SpO2 99 %. Body mass index is 34.97 kg/m.  General: Cooperative, alert, well developed, in no acute distress. HEENT: Conjunctivae and lids unremarkable. Cardiovascular: Regular rhythm.  Lungs: Normal work of breathing. Neurologic: No focal deficits.   Lab Results  Component Value Date   CREATININE 0.94 05/28/2019   BUN 14 05/28/2019   NA 144 05/28/2019   K 4.6 05/28/2019   CL 104 05/28/2019   CO2 24 05/28/2019   Lab Results  Component Value Date   ALT 57 (H) 05/28/2019   AST 46 (H) 05/28/2019   ALKPHOS 71 05/28/2019   BILITOT 0.3 05/28/2019   Lab Results  Component Value Date   HGBA1C 4.8 05/28/2019   Lab Results  Component Value Date   INSULIN 19.0 05/28/2019   Lab Results  Component Value Date   TSH 0.559 05/28/2019   Lab Results  Component Value Date   CHOL 137 05/28/2019   HDL 51 05/28/2019   LDLCALC 70 05/28/2019   TRIG 83 05/28/2019   CHOLHDL 4 09/26/2018   Lab Results  Component Value Date   WBC 7.1 05/28/2019   HGB 15.6 05/28/2019   HCT 45.7 05/28/2019   MCV 88 05/28/2019   PLT 266 05/28/2019   No results found for: IRON, TIBC, FERRITIN  Attestation Statements:   Reviewed by clinician on day of visit: allergies, medications, problem list, medical history, surgical history, family history, social history, and previous encounter notes.  Migdalia Dk, am acting as Location manager for [employer], DO   I have reviewed the above documentation for accuracy and completeness, and I agree with the above. Jearld Lesch, DO

## 2019-06-25 ENCOUNTER — Other Ambulatory Visit: Payer: Self-pay

## 2019-06-25 ENCOUNTER — Encounter (INDEPENDENT_AMBULATORY_CARE_PROVIDER_SITE_OTHER): Payer: Self-pay | Admitting: Family Medicine

## 2019-06-25 ENCOUNTER — Ambulatory Visit (INDEPENDENT_AMBULATORY_CARE_PROVIDER_SITE_OTHER): Payer: 59 | Admitting: Family Medicine

## 2019-06-25 VITALS — BP 117/72 | HR 66 | Temp 98.2°F | Ht 68.0 in | Wt 227.0 lb

## 2019-06-25 DIAGNOSIS — E038 Other specified hypothyroidism: Secondary | ICD-10-CM

## 2019-06-25 DIAGNOSIS — E8881 Metabolic syndrome: Secondary | ICD-10-CM | POA: Diagnosis not present

## 2019-06-25 DIAGNOSIS — E559 Vitamin D deficiency, unspecified: Secondary | ICD-10-CM

## 2019-06-25 DIAGNOSIS — Z6834 Body mass index (BMI) 34.0-34.9, adult: Secondary | ICD-10-CM

## 2019-06-25 DIAGNOSIS — R7989 Other specified abnormal findings of blood chemistry: Secondary | ICD-10-CM

## 2019-06-25 DIAGNOSIS — E669 Obesity, unspecified: Secondary | ICD-10-CM

## 2019-06-25 DIAGNOSIS — Z9189 Other specified personal risk factors, not elsewhere classified: Secondary | ICD-10-CM | POA: Diagnosis not present

## 2019-06-25 MED ORDER — METFORMIN HCL 500 MG PO TABS: 1000.0000 mg | ORAL_TABLET | Freq: Every day | ORAL | 0 refills | Status: DC

## 2019-06-25 MED FILL — metFORMIN HCL 500 MG TABS: 500 | 30 days supply | Qty: 60 | Fill #0

## 2019-06-25 NOTE — Progress Notes (Signed)
Chief Complaint:   OBESITY Albert [name] is here to discuss his progress with his obesity treatment plan along with follow-up of his obesity related diagnoses. Albert [name] is on the Category 4 Plan and states he is following his eating plan approximately 90% of the time. Albert [name] states he is going to the gym for 75 minutes 5 times a week and boxing for 90 minutes 1 time per week.  Today's visit was #: 3 Starting weight: 236 lbs Starting date: 05/28/2019 Today's weight: 227 lbs Today's date: 06/25/2019 Total lbs lost to date: 9 lbs Total lbs lost since last in-office visit: 3 lbs  Interim History: Albert [name] is doing well.  He is able to eat breakfast on plan, sandwiches at lunch, and meat and vegetables at restaurants for dinner.  He wants to increase his weight training.  He still drinks 2-4 diet sodas each day.  Subjective:   1. Insulin resistance Albert [name] has a diagnosis of insulin resistance based on his elevated fasting insulin level >5. He continues to work on diet and exercise to decrease his risk of diabetes.  Lab Results  Component Value Date   INSULIN 19.0 05/28/2019   Lab Results  Component Value Date   HGBA1C 4.8 05/28/2019   2. Elevated LFTs Albert [name] has a new dx of elevated ALT. His BMI is over 34. He denies abdominal pain or jaundice and has never been told of any liver problems in the past. He denies excessive alcohol intake.  Lab Results  Component Value Date   ALT 57 (H) 05/28/2019   AST 46 (H) 05/28/2019   ALKPHOS 71 05/28/2019   BILITOT 0.3 05/28/2019   3. Vitamin D deficiency Albert [name]'s Vitamin D level was 49.7 on 05/28/2019. He is currently taking vit D 5000 IU daily. He denies nausea, vomiting or muscle weakness.  4. Other specified hypothyroidism Albert [name] is taking levothyroxine 88 mcg and liothyronine 5 mcg daily.   Lab Results  Component Value Date   TSH 0.559 05/28/2019   5. At risk for diabetes mellitus Albert [name] is at higher than average risk for developing  diabetes due to his obesity and medications.  Assessment/Plan:   1. Insulin resistance Albert [name] will continue to work on weight loss, exercise, and decreasing simple carbohydrates to help decrease the risk of diabetes. Albert [name] agreed to follow-up with [name] as directed to closely monitor his progress.  Will increase metformin to twice daily, as below.  Orders - metFORMIN (GLUCOPHAGE) 500 MG tablet; Take 2 tablets (1,000 mg total) by mouth daily with breakfast.  Dispense: 60 tablet; Refill: 0  2. Elevated LFTs Albert [name] was educated the importance of weight loss. Albert [name] agreed to continue with his weight loss efforts with healthier diet and exercise as an essential part of his treatment plan.  3. Vitamin D deficiency Low Vitamin D level contributes to fatigue and are associated with obesity, breast, and colon cancer. He agrees to continue to take Vitamin D @5 ,000 IU daily and will follow-up for routine testing of Vitamin D, at least 2-3 times per year to avoid over-replacement.  4. Other specified hypothyroidism Patient with long-standing hypothyroidism, on levothyroxine therapy. He appears euthyroid. Orders and follow up as documented in patient record.  Counseling . Good thyroid control is important for overall health. Albert [name] thyroid levels are dangerous and will not improve weight loss results. . The correct way to take levothyroxine is fasting, with water, separated by at least 30 minutes from breakfast, and separated by more than 4 hours from calcium,  iron, multivitamins, acid reflux medications (PPIs).   5. At risk for diabetes mellitus Albert [name] was given approximately 15 minutes of diabetes education and counseling today. We discussed intensive lifestyle modifications today with an emphasis on weight loss as well as increasing exercise and decreasing simple carbohydrates in his diet. We also reviewed medication options with an emphasis on risk versus benefit of those discussed.    Repetitive spaced learning was employed today to elicit superior memory formation and behavioral change.  6. Class 1 obesity with serious comorbidity and body mass index (BMI) of 34.0 to 34.9 in adult, unspecified obesity type Albert [name] is currently in the action stage of change. As such, his goal is to continue with weight loss efforts. He has agreed to the Category 4 Plan.   Exercise goals: For substantial health benefits, adults should do at least 150 minutes (2 hours and 30 minutes) a week of moderate-intensity, or 75 minutes (1 hour and 15 minutes) a week of vigorous-intensity aerobic physical activity, or an equivalent combination of moderate- and vigorous-intensity aerobic activity. Aerobic activity should be performed in episodes of at least 10 minutes, and preferably, it should be spread throughout the week. Adults should also include muscle-strengthening activities that involve all major muscle groups on 2 or more days a week.  Behavioral modification strategies: increasing lean protein intake and increasing water intake.  Albert [name] has agreed to follow-up with our clinic in 2 weeks. He was informed of the importance of frequent follow-up visits to maximize his success with intensive lifestyle modifications for his multiple health conditions.   Objective:   Blood pressure 117/72, pulse 66, temperature 98.2 F (36.8 C), temperature source Oral, height 5\' 8"  (1.727 m), weight 227 lb (103 kg), SpO2 98 %. Body mass index is 34.52 kg/m.  General: Cooperative, alert, well developed, in no acute distress. HEENT: Conjunctivae and lids unremarkable. Cardiovascular: Regular rhythm.  Lungs: Normal work of breathing. Neurologic: No focal deficits.   Lab Results  Component Value Date   CREATININE 0.94 05/28/2019   BUN 14 05/28/2019   NA 144 05/28/2019   K 4.6 05/28/2019   CL 104 05/28/2019   CO2 24 05/28/2019   Lab Results  Component Value Date   ALT 57 (H) 05/28/2019   AST 46 (H)  05/28/2019   ALKPHOS 71 05/28/2019   BILITOT 0.3 05/28/2019   Lab Results  Component Value Date   HGBA1C 4.8 05/28/2019   Lab Results  Component Value Date   INSULIN 19.0 05/28/2019   Lab Results  Component Value Date   TSH 0.559 05/28/2019   Lab Results  Component Value Date   CHOL 137 05/28/2019   HDL 51 05/28/2019   LDLCALC 70 05/28/2019   TRIG 83 05/28/2019   CHOLHDL 4 09/26/2018   Lab Results  Component Value Date   WBC 7.1 05/28/2019   HGB 15.6 05/28/2019   HCT 45.7 05/28/2019   MCV 88 05/28/2019   PLT 266 05/28/2019   Attestation Statements:   Reviewed by clinician on day of visit: allergies, medications, problem list, medical history, surgical history, family history, social history, and previous encounter notes.  I, Water quality scientist, CMA, am acting as Location manager for [employer], DO.  I have reviewed the above documentation for accuracy and completeness, and I agree with the above. Briscoe Deutscher, DO

## 2019-07-08 ENCOUNTER — Encounter (INDEPENDENT_AMBULATORY_CARE_PROVIDER_SITE_OTHER): Payer: Self-pay | Admitting: Physician Assistant

## 2019-07-08 ENCOUNTER — Ambulatory Visit (INDEPENDENT_AMBULATORY_CARE_PROVIDER_SITE_OTHER): Payer: 59 | Admitting: Physician Assistant

## 2019-07-08 ENCOUNTER — Other Ambulatory Visit: Payer: Self-pay

## 2019-07-08 VITALS — BP 110/74 | HR 70 | Temp 98.3°F | Ht 68.0 in | Wt 226.0 lb

## 2019-07-08 DIAGNOSIS — Z6834 Body mass index (BMI) 34.0-34.9, adult: Secondary | ICD-10-CM

## 2019-07-08 DIAGNOSIS — K5909 Other constipation: Secondary | ICD-10-CM

## 2019-07-08 DIAGNOSIS — Z9189 Other specified personal risk factors, not elsewhere classified: Secondary | ICD-10-CM

## 2019-07-08 DIAGNOSIS — E8881 Metabolic syndrome: Secondary | ICD-10-CM | POA: Diagnosis not present

## 2019-07-08 DIAGNOSIS — E669 Obesity, unspecified: Secondary | ICD-10-CM

## 2019-07-08 MED ORDER — LINACLOTIDE 145 MCG PO CAPS: 145.0000 ug | ORAL_CAPSULE | Freq: Every day | ORAL | 0 refills | Status: DC

## 2019-07-08 MED FILL — LINZESS 145 MCG CAPSULE: 145 | 30 days supply | Qty: 30 | Fill #0

## 2019-07-08 NOTE — Progress Notes (Signed)
Chief Complaint:   OBESITY Albert [name] is here to discuss his progress with his obesity treatment plan along with follow-up of his obesity related diagnoses. Brittany is on the Category 4 Plan and states he is following his eating plan approximately 90% of the time. [name] states he is weightlifting/walking 90 minutes 5 times per week.  Today's visit was #: 4 Starting weight: 236 lbs Starting date: 05/28/2019 Today's weight: 226 lbs Today's date: 07/08/2019 Total lbs lost to date: 10 Total lbs lost since last in-office visit: 1  Interim History: [name] states that he does not have a kitchen in his apartment and therefore eats most of his meals out. He is exercising regularly and wants to work on building muscle.  Subjective:   Other constipation. [name] reports constipation as a chronic issue due to his psychiatric medications. He has tried stool softeners with no relief. He states he is drinking enough water and getting enough fiber.  Insulin resistance. [name] has a diagnosis of insulin resistance based on his elevated fasting insulin level >5. He continues to work on diet and exercise to decrease his risk of diabetes. He is on no medications. No polyphagia.  Lab Results  Component Value Date   INSULIN 19.0 05/28/2019   Lab Results  Component Value Date   HGBA1C 4.8 05/28/2019   At risk for diabetes mellitus. [name] is at higher than average risk for developing diabetes due to his obesity.   Assessment/Plan:   Other constipation. Handsome was informed that a decrease in bowel movement frequency is normal while losing weight, but stools should not be hard or painful. Orders and follow up as documented in patient record. He was given a prescription for  linaclotide (LINZESS) 145 MCG CAPS capsule daily #30 with 0 refills.  Counseling Getting to Good Bowel Health: Your goal is to have one soft bowel movement each day. Drink at least 8 glasses of water each day. Eat plenty of  fiber (goal is over 25 grams each day). It is best to get most of your fiber from dietary sources which includes leafy green vegetables, fresh fruit, and whole grains. You may need to add fiber with the help of OTC fiber supplements. These include Metamucil, Citrucel, and Flaxseed. If you are still having trouble, try adding Miralax or Magnesium Citrate. If all of these changes do not work, Cabin crew.   Insulin resistance. [name] will continue to work on weight loss, exercise, and decreasing simple carbohydrates to help decrease the risk of diabetes. [name] agreed to follow-up with [name] as directed to closely monitor his progress.  At risk for diabetes mellitus. [name] was given approximately 15 minutes of diabetes education and counseling today. We discussed intensive lifestyle modifications today with an emphasis on weight loss as well as increasing exercise and decreasing simple carbohydrates in his diet. We also reviewed medication options with an emphasis on risk versus benefit of those discussed.   Repetitive spaced learning was employed today to elicit superior memory formation and behavioral change.  Class 1 obesity with serious comorbidity and body mass index (BMI) of 34.0 to 34.9 in adult, unspecified obesity type.  [name] is currently in the action stage of change. As such, his goal is to continue with weight loss efforts. He has agreed to keeping a food journal and adhering to recommended goals of 1800 calories and 115 grams of protein.   Exercise goals: For substantial health benefits, adults should do at least 150 minutes (2  hours and 30 minutes) a week of moderate-intensity, or 75 minutes (1 hour and 15 minutes) a week of vigorous-intensity aerobic physical activity, or an equivalent combination of moderate- and vigorous-intensity aerobic activity. Aerobic activity should be performed in episodes of at least 10 minutes, and preferably, it should be spread throughout the  week.  Behavioral modification strategies: increasing water intake and planning for success.  [name] has agreed to follow-up with our clinic in 2-3 weeks. He was informed of the importance of frequent follow-up visits to maximize his success with intensive lifestyle modifications for his multiple health conditions.   Objective:   Blood pressure 110/74, pulse 70, temperature 98.3 F (36.8 C), temperature source Oral, height 5\' 8"  (1.727 m), weight 226 lb (102.5 kg), SpO2 97 %. Body mass index is 34.36 kg/m.  General: Cooperative, alert, well developed, in no acute distress. HEENT: Conjunctivae and lids unremarkable. Cardiovascular: Regular rhythm.  Lungs: Normal work of breathing. Neurologic: No focal deficits.   Lab Results  Component Value Date   CREATININE 0.94 05/28/2019   BUN 14 05/28/2019   NA 144 05/28/2019   K 4.6 05/28/2019   CL 104 05/28/2019   CO2 24 05/28/2019   Lab Results  Component Value Date   ALT 57 (H) 05/28/2019   AST 46 (H) 05/28/2019   ALKPHOS 71 05/28/2019   BILITOT 0.3 05/28/2019   Lab Results  Component Value Date   HGBA1C 4.8 05/28/2019   Lab Results  Component Value Date   INSULIN 19.0 05/28/2019   Lab Results  Component Value Date   TSH 0.559 05/28/2019   Lab Results  Component Value Date   CHOL 137 05/28/2019   HDL 51 05/28/2019   LDLCALC 70 05/28/2019   TRIG 83 05/28/2019   CHOLHDL 4 09/26/2018   Lab Results  Component Value Date   WBC 7.1 05/28/2019   HGB 15.6 05/28/2019   HCT 45.7 05/28/2019   MCV 88 05/28/2019   PLT 266 05/28/2019   No results found for: IRON, TIBC, FERRITIN  Attestation Statements:   Reviewed by clinician on day of visit: allergies, medications, problem list, medical history, surgical history, family history, social history, and previous encounter notes.  IMichaelene Song, am acting as transcriptionist for Abby Potash, PA-C   I have reviewed the above documentation for accuracy and completeness,  and I agree with the above. Abby Potash, PA-C

## 2019-07-09 DIAGNOSIS — F331 Major depressive disorder, recurrent, moderate: Secondary | ICD-10-CM | POA: Diagnosis not present

## 2019-07-09 DIAGNOSIS — F411 Generalized anxiety disorder: Secondary | ICD-10-CM | POA: Diagnosis not present

## 2019-07-09 MED FILL — PHENELZINE SULFATE 15 MG TA: 15 | 90 days supply | Qty: 540 | Fill #0

## 2019-07-29 ENCOUNTER — Ambulatory Visit (INDEPENDENT_AMBULATORY_CARE_PROVIDER_SITE_OTHER): Payer: 59 | Admitting: Physician Assistant

## 2019-07-29 ENCOUNTER — Other Ambulatory Visit: Payer: Self-pay

## 2019-07-29 ENCOUNTER — Encounter (INDEPENDENT_AMBULATORY_CARE_PROVIDER_SITE_OTHER): Payer: Self-pay | Admitting: Physician Assistant

## 2019-07-29 VITALS — BP 118/76 | HR 92 | Temp 98.5°F | Ht 68.0 in | Wt 226.0 lb

## 2019-07-29 DIAGNOSIS — E038 Other specified hypothyroidism: Secondary | ICD-10-CM

## 2019-07-29 DIAGNOSIS — Z6834 Body mass index (BMI) 34.0-34.9, adult: Secondary | ICD-10-CM | POA: Diagnosis not present

## 2019-07-29 DIAGNOSIS — E669 Obesity, unspecified: Secondary | ICD-10-CM | POA: Diagnosis not present

## 2019-07-29 NOTE — Progress Notes (Signed)
Chief Complaint:   OBESITY Albert [name] is here to discuss his progress with his obesity treatment plan along with follow-up of his obesity related diagnoses. Albert [name] is on keeping a food journal and adhering to recommended goals of 1800 calories and 115 grams of protein and states he is following his eating plan approximately 85-90% of the time. Albert [name] states he is weight lifting and doing cardio for 60-90 minutes 5 times per week.  Today's visit was #: 5 Starting weight: 236 lbs Starting date: 05/28/2019 Today's weight: 226 lbs Today's date: 07/29/2019 Total lbs lost to date: 10 lbs Total lbs lost since last in-office visit: 0  Interim History: Albert [name] does not have a full kitchen in his apartment and is unable to cook, therefore, he eats out often.  He was using [employer]itness [employer] but was having a difficult time with the accuracy of his tracking.  Subjective:   1. Other specified hypothyroidism Phelps is on levothyroxine 88 mcg daily.  No excessive fatigue or hair loss.  Lab Results  Component Value Date   TSH 0.559 05/28/2019   Assessment/Plan:   1. Other specified hypothyroidism Patient with long-standing hypothyroidism, on levothyroxine therapy. He appears euthyroid. Orders and follow up as documented in patient record.  Counseling . Good thyroid control is important for overall health. Supratherapeutic thyroid levels are dangerous and will not improve weight loss results. . The correct way to take levothyroxine is fasting, with water, separated by at least 30 minutes from breakfast, and separated by more than 4 hours from calcium, iron, multivitamins, acid reflux medications (PPIs).   2. Class 1 obesity with serious comorbidity and body mass index (BMI) of 34.0 to 34.9 in adult, unspecified obesity type Albert [name] is currently in the action stage of change. As such, his goal is to continue with weight loss efforts. He has agreed to the Category 4 Plan.   Exercise goals: As  is.  Behavioral modification strategies: decreasing eating out and meal planning and cooking strategies.  Albert [name] has agreed to follow-up with our clinic in 2 weeks. He was informed of the importance of frequent follow-up visits to maximize his success with intensive lifestyle modifications for his multiple health conditions.   Objective:   Blood pressure 118/76, pulse 92, temperature 98.5 F (36.9 C), temperature source Oral, height 5\' 8"  (1.727 m), weight 226 lb (102.5 kg), SpO2 98 %. Body mass index is 34.36 kg/m.  General: Cooperative, alert, well developed, in no acute distress. HEENT: Conjunctivae and lids unremarkable. Cardiovascular: Regular rhythm.  Lungs: Normal work of breathing. Neurologic: No focal deficits.   Lab Results  Component Value Date   CREATININE 0.94 05/28/2019   BUN 14 05/28/2019   NA 144 05/28/2019   K 4.6 05/28/2019   CL 104 05/28/2019   CO2 24 05/28/2019   Lab Results  Component Value Date   ALT 57 (H) 05/28/2019   AST 46 (H) 05/28/2019   ALKPHOS 71 05/28/2019   BILITOT 0.3 05/28/2019   Lab Results  Component Value Date   HGBA1C 4.8 05/28/2019   Lab Results  Component Value Date   INSULIN 19.0 05/28/2019   Lab Results  Component Value Date   TSH 0.559 05/28/2019   Lab Results  Component Value Date   CHOL 137 05/28/2019   HDL 51 05/28/2019   LDLCALC 70 05/28/2019   TRIG 83 05/28/2019   CHOLHDL 4 09/26/2018   Lab Results  Component Value Date   WBC 7.1 05/28/2019   HGB  15.6 05/28/2019   HCT 45.7 05/28/2019   MCV 88 05/28/2019   PLT 266 05/28/2019   Attestation Statements:   Reviewed by clinician on day of visit: allergies, medications, problem list, medical history, surgical history, family history, social history, and previous encounter notes.  Time spent on visit including pre-visit chart review and post-visit care and charting was 32 minutes.   I, Water quality scientist, CMA, am acting as Location manager for [employer],  PA-C.  I have reviewed the above documentation for accuracy and completeness, and I agree with the above. Abby Potash, PA-C

## 2019-08-06 DIAGNOSIS — E039 Hypothyroidism, unspecified: Secondary | ICD-10-CM | POA: Diagnosis not present

## 2019-08-06 DIAGNOSIS — E291 Testicular hypofunction: Secondary | ICD-10-CM | POA: Diagnosis not present

## 2019-08-06 DIAGNOSIS — L74519 Primary focal hyperhidrosis, unspecified: Secondary | ICD-10-CM | POA: Diagnosis not present

## 2019-08-06 MED FILL — GLYCOPYRROLATE 1 MG TABS: 1 | 30 days supply | Qty: 120 | Fill #1

## 2019-08-12 ENCOUNTER — Ambulatory Visit (INDEPENDENT_AMBULATORY_CARE_PROVIDER_SITE_OTHER): Payer: 59 | Admitting: Family Medicine

## 2019-08-12 ENCOUNTER — Encounter (INDEPENDENT_AMBULATORY_CARE_PROVIDER_SITE_OTHER): Payer: Self-pay | Admitting: Family Medicine

## 2019-08-12 ENCOUNTER — Other Ambulatory Visit: Payer: Self-pay

## 2019-08-12 VITALS — BP 117/76 | HR 90 | Temp 98.6°F | Ht 68.0 in | Wt 222.0 lb

## 2019-08-12 DIAGNOSIS — Z6833 Body mass index (BMI) 33.0-33.9, adult: Secondary | ICD-10-CM

## 2019-08-12 DIAGNOSIS — E669 Obesity, unspecified: Secondary | ICD-10-CM

## 2019-08-12 DIAGNOSIS — Z9189 Other specified personal risk factors, not elsewhere classified: Secondary | ICD-10-CM

## 2019-08-12 DIAGNOSIS — K5909 Other constipation: Secondary | ICD-10-CM

## 2019-08-12 DIAGNOSIS — E8881 Metabolic syndrome: Secondary | ICD-10-CM | POA: Diagnosis not present

## 2019-08-12 MED ORDER — LINACLOTIDE 145 MCG PO CAPS: 145.0000 ug | ORAL_CAPSULE | Freq: Every day | ORAL | 0 refills | Status: DC

## 2019-08-12 MED ORDER — METFORMIN HCL 500 MG PO TABS: ORAL_TABLET | ORAL | 0 refills | Status: DC

## 2019-08-12 MED FILL — LINZESS 145 MCG CAPSULE: 145 | 30 days supply | Qty: 30 | Fill #0

## 2019-08-12 MED FILL — METFORMIN HCL 500 MG TABS: 500 | 30 days supply | Qty: 90 | Fill #0

## 2019-08-12 NOTE — Progress Notes (Signed)
Chief Complaint:   OBESITY Albert [name] is here to discuss his progress with his obesity treatment plan along with follow-up of his obesity related diagnoses. Albert [name] is on the Category 4 Plan and states he is following his eating plan approximately 90% of the time. Albert [name] states he is lifting weights and running for 60-90 minutes 4 times per week.  Today's visit was #: 6 Starting weight: 236 lbs Starting date: 05/28/2019 Today's weight: 222 lbs Today's date: 08/12/2019 Total lbs lost to date: 14 Total lbs lost since last in-office visit: 4  Interim History: Albert [name] has done well on the plan over the past few weeks. He reports gaining weight due to psych medications over the past few years. He feels like he does better with Category 4 than journaling. He does not have a full kitchen in [facility] student) but usually picks up food in accordance with cat 3 plan.  Subjective:   1. Insulin resistance Albert [name] is on metformin and he feels that it helps with hunger. Lab Results  Component Value Date   HGBA1C 4.8 05/28/2019    2. Other constipation Albert [name] reports his constipation is much better with Linzess. He would like to increase the dose. He notes his constipation is due to his psychotropic medications. He is drinking adequate water and eating the vegetables on the plan.   3. At risk for allergic reaction to medication Albert [name] is at risk for drug side effects due to increased dose of metformin.  Assessment/Plan:   1. Insulin resistance Albert [name] will continue to work on weight loss, exercise, and decreasing simple carbohydrates to help decrease the risk of diabetes. We will refill metformin for 1 month. Albert [name] agreed to follow-up with [name] as directed to closely monitor his progress.  - metFORMIN (GLUCOPHAGE) 500 MG tablet; Take 2 tablets by mouth at breakfast and 1 tablet at lunch.  Dispense: 90 tablet; Refill: 0  2. Other constipation We will refill Linzess for 1 month. Albert [name] was  advised to talk with his primary care physician about dosage increase and also have primary care take pber prescribing this medication. Orders and follow up as documented in patient record.   - linaclotide (LINZESS) 145 MCG CAPS capsule; Take 1 capsule (145 mcg total) by mouth daily before breakfast.  Dispense: 30 capsule; Refill: 0  3. At risk for allergic reaction to medication Albert [name] was given approximately 15 minutes of drug side effect counseling today.  We discussed side effect possibility (Nausea, diarrhea) from metformin. and risk versus benefits. Albert [name] agreed to the medication and will contact this office if these side effects are intolerable.  Repetitive spaced learning was employed today to elicit superior memory formation and behavioral change.  4. Class 1 obesity with serious comorbidity and body mass index (BMI) of 33.0 to 33.9 in adult, unspecified obesity type Albert [name] is currently in the action stage of change. As such, his goal is to continue with weight loss efforts. He has agreed to the Category 4 Plan with additional breakfast and lunch options.   Exercise goals: As is.  Behavioral modification strategies: increasing lean protein intake, decreasing simple carbohydrates and increasing water intake.  Albert [name] has agreed to follow-up with our clinic in 2 weeks. He was informed of the importance of frequent follow-up visits to maximize his success with intensive lifestyle modifications for his multiple health conditions.   Objective:   Blood pressure 117/76, pulse 90, temperature 98.6 F (37 C), temperature source Oral, height 5\' 8"  (1.727 m),  weight 222 lb (100.7 kg), SpO2 97 %. Body mass index is 33.75 kg/m.  General: Cooperative, alert, well developed, in no acute distress. HEENT: Conjunctivae and lids unremarkable. Cardiovascular: Regular rhythm.  Lungs: Normal work of breathing. Neurologic: No focal deficits.   Lab Results  Component Value Date   CREATININE 0.94  05/28/2019   BUN 14 05/28/2019   NA 144 05/28/2019   K 4.6 05/28/2019   CL 104 05/28/2019   CO2 24 05/28/2019   Lab Results  Component Value Date   ALT 57 (H) 05/28/2019   AST 46 (H) 05/28/2019   ALKPHOS 71 05/28/2019   BILITOT 0.3 05/28/2019   Lab Results  Component Value Date   HGBA1C 4.8 05/28/2019   Lab Results  Component Value Date   INSULIN 19.0 05/28/2019   Lab Results  Component Value Date   TSH 0.559 05/28/2019   Lab Results  Component Value Date   CHOL 137 05/28/2019   HDL 51 05/28/2019   LDLCALC 70 05/28/2019   TRIG 83 05/28/2019   CHOLHDL 4 09/26/2018   Lab Results  Component Value Date   WBC 7.1 05/28/2019   HGB 15.6 05/28/2019   HCT 45.7 05/28/2019   MCV 88 05/28/2019   PLT 266 05/28/2019   No results found for: IRON, TIBC, FERRITIN  Attestation Statements:   Reviewed by clinician on day of visit: allergies, medications, problem list, medical history, surgical history, family history, social history, and previous encounter notes.   Wilhemena Durie, am acting as Location manager for [employer], FNP-C.  I have reviewed the above documentation for accuracy and completeness, and I agree with the above. -  Georgianne Fick, FNP

## 2019-08-13 ENCOUNTER — Encounter (INDEPENDENT_AMBULATORY_CARE_PROVIDER_SITE_OTHER): Payer: Self-pay | Admitting: Family Medicine

## 2019-08-13 DIAGNOSIS — K5909 Other constipation: Secondary | ICD-10-CM | POA: Insufficient documentation

## 2019-08-20 MED FILL — LIOTHYRONINE SODIUM 5 MCG T: 5 | 90 days supply | Qty: 90 | Fill #2

## 2019-08-26 ENCOUNTER — Encounter (INDEPENDENT_AMBULATORY_CARE_PROVIDER_SITE_OTHER): Payer: Self-pay | Admitting: Physician Assistant

## 2019-08-26 ENCOUNTER — Ambulatory Visit (INDEPENDENT_AMBULATORY_CARE_PROVIDER_SITE_OTHER): Payer: 59 | Admitting: Physician Assistant

## 2019-08-26 ENCOUNTER — Other Ambulatory Visit: Payer: Self-pay

## 2019-08-26 VITALS — BP 114/70 | HR 74 | Temp 98.1°F | Ht 68.0 in | Wt 221.0 lb

## 2019-08-26 DIAGNOSIS — E8881 Metabolic syndrome: Secondary | ICD-10-CM | POA: Diagnosis not present

## 2019-08-26 DIAGNOSIS — Z6833 Body mass index (BMI) 33.0-33.9, adult: Secondary | ICD-10-CM

## 2019-08-26 DIAGNOSIS — E669 Obesity, unspecified: Secondary | ICD-10-CM

## 2019-08-27 NOTE — Progress Notes (Signed)
Chief Complaint:   OBESITY Albert [name] is here to discuss his progress with his obesity treatment plan along with follow-up of his obesity related diagnoses. Albert [name] is on the Category 4 Plan and states he is following his eating plan approximately 95% of the time. Albert [name] states he is doing weight training and cardio for 75 minutes 5 times per week.  Today's visit was #: 7 Starting weight: 236 lbs Starting date: 05/28/2019 Today's weight: 221 lbs Today's date: 08/26/2019 Total lbs lost to date: 15 lbs Total lbs lost since last in-office visit: 1 lb  Interim History: Albert [name] states that the past 2 weeks he has done well with the plan.  He splurged for the Easter holiday yesterday, but is back on track today.  He has MCATs this weekend.  Subjective:   1. Insulin resistance Albert [name] has a diagnosis of insulin resistance based on his elevated fasting insulin level >5. He continues to work on diet and exercise to decrease his risk of diabetes.  Denies cravings or polyphagia.  He is taking metformin.  No nausea, vomiting, or diarrhea.  Lab Results  Component Value Date   INSULIN 19.0 05/28/2019   Lab Results  Component Value Date   HGBA1C 4.8 05/28/2019   Assessment/Plan:   1. Insulin resistance Albert [name] will continue to work on weight loss, exercise, and decreasing simple carbohydrates to help decrease the risk of diabetes. Albert [name] agreed to follow-up with [name] as directed to closely monitor his progress.  2. Class 1 obesity with serious comorbidity and body mass index (BMI) of 33.0 to 33.9 in adult, unspecified obesity type Albert [name] is currently in the action stage of change. As such, his goal is to continue with weight loss efforts. He has agreed to the Category 4 Plan.   Exercise goals: As is.  Behavioral modification strategies: meal planning and cooking strategies and keeping healthy foods in the home.  Albert [name] has agreed to follow-up with our clinic in 2 weeks. He was informed of the  importance of frequent follow-up visits to maximize his success with intensive lifestyle modifications for his multiple health conditions.   Objective:   Blood pressure 114/70, pulse 74, temperature 98.1 F (36.7 C), temperature source Oral, height 5\' 8"  (1.727 m), weight 221 lb (100.2 kg), SpO2 97 %. Body mass index is 33.6 kg/m.  General: Cooperative, alert, well developed, in no acute distress. HEENT: Conjunctivae and lids unremarkable. Cardiovascular: Regular rhythm.  Lungs: Normal work of breathing. Neurologic: No focal deficits.   Lab Results  Component Value Date   CREATININE 0.94 05/28/2019   BUN 14 05/28/2019   NA 144 05/28/2019   K 4.6 05/28/2019   CL 104 05/28/2019   CO2 24 05/28/2019   Lab Results  Component Value Date   ALT 57 (H) 05/28/2019   AST 46 (H) 05/28/2019   ALKPHOS 71 05/28/2019   BILITOT 0.3 05/28/2019   Lab Results  Component Value Date   HGBA1C 4.8 05/28/2019   Lab Results  Component Value Date   INSULIN 19.0 05/28/2019   Lab Results  Component Value Date   TSH 0.559 05/28/2019   Lab Results  Component Value Date   CHOL 137 05/28/2019   HDL 51 05/28/2019   LDLCALC 70 05/28/2019   TRIG 83 05/28/2019   CHOLHDL 4 09/26/2018   Lab Results  Component Value Date   WBC 7.1 05/28/2019   HGB 15.6 05/28/2019   HCT 45.7 05/28/2019   MCV 88 05/28/2019   PLT 266  05/28/2019   Attestation Statements:   Reviewed by clinician on day of visit: allergies, medications, problem list, medical history, surgical history, family history, social history, and previous encounter notes.  Time spent on visit including pre-visit chart review and post-visit care and charting was 31 minutes.   I, Water quality scientist, CMA, am acting as Location manager for [employer], PA-C.  I have reviewed the above documentation for accuracy and completeness, and I agree with the above. Abby Potash, PA-C

## 2019-09-02 MED FILL — LEVOTHYROXINE SODIUM 88 MCG: 88 | 90 days supply | Qty: 90 | Fill #2

## 2019-09-03 ENCOUNTER — Other Ambulatory Visit: Payer: Self-pay | Admitting: Family Medicine

## 2019-09-03 MED ORDER — LINACLOTIDE 145 MCG PO CAPS: 145.0000 ug | ORAL_CAPSULE | Freq: Every day | ORAL | 1 refills | Status: DC

## 2019-09-09 ENCOUNTER — Ambulatory Visit (INDEPENDENT_AMBULATORY_CARE_PROVIDER_SITE_OTHER): Payer: 59 | Admitting: Physician Assistant

## 2019-09-09 ENCOUNTER — Encounter (INDEPENDENT_AMBULATORY_CARE_PROVIDER_SITE_OTHER): Payer: Self-pay | Admitting: Physician Assistant

## 2019-09-09 ENCOUNTER — Other Ambulatory Visit: Payer: Self-pay

## 2019-09-09 VITALS — BP 110/72 | HR 82 | Temp 98.8°F | Ht 68.0 in | Wt 223.0 lb

## 2019-09-09 DIAGNOSIS — E669 Obesity, unspecified: Secondary | ICD-10-CM

## 2019-09-09 DIAGNOSIS — Z9189 Other specified personal risk factors, not elsewhere classified: Secondary | ICD-10-CM | POA: Diagnosis not present

## 2019-09-09 DIAGNOSIS — E8881 Metabolic syndrome: Secondary | ICD-10-CM | POA: Diagnosis not present

## 2019-09-09 DIAGNOSIS — Z6833 Body mass index (BMI) 33.0-33.9, adult: Secondary | ICD-10-CM | POA: Diagnosis not present

## 2019-09-09 DIAGNOSIS — K5909 Other constipation: Secondary | ICD-10-CM

## 2019-09-09 MED ORDER — LINACLOTIDE 145 MCG PO CAPS: 145.0000 ug | ORAL_CAPSULE | Freq: Every day | ORAL | 0 refills | Status: DC

## 2019-09-09 MED ORDER — METFORMIN HCL 500 MG PO TABS: ORAL_TABLET | ORAL | 0 refills | Status: DC

## 2019-09-09 MED FILL — LINZESS 145 MCG CAPSULE: 145 | 30 days supply | Qty: 30 | Fill #0

## 2019-09-09 MED FILL — METFORMIN HCL 500 MG TABS: 500 | 30 days supply | Qty: 90 | Fill #0

## 2019-09-10 ENCOUNTER — Ambulatory Visit (INDEPENDENT_AMBULATORY_CARE_PROVIDER_SITE_OTHER): Payer: 59 | Admitting: Physician Assistant

## 2019-09-10 NOTE — Progress Notes (Signed)
Chief Complaint:   OBESITY Albert [name] is here to discuss his progress with his obesity treatment plan along with follow-up of his obesity related diagnoses. Albert [name] is on the Category 4 Plan and states he is following his eating plan approximately 75% of the time. Albert [name] states he is doing cardio/weightlifting 60 minutes 3-5 times per week.  Today's visit was #: 8 Starting weight: 236 lbs Starting date: 05/28/2019 Today's weight: 223 lbs Today's date: 09/09/2019 Total lbs lost to date: 13 Total lbs lost since last in-office visit: 0  Interim History: Albert [name] states that he has been unable to stay on plan as closely recently due to studying for [employer] and now the end of semester exams and papers.  Subjective:   Other constipation. Albert [name] reports that Linzess helps with constipation. No blood in stool or black stools.  Insulin resistance. Albert [name] has a diagnosis of insulin resistance based on his elevated fasting insulin level >5. He continues to work on diet and exercise to decrease his risk of diabetes. He is on metformin. No nausea, vomiting, or diarrhea. No polyphagia.  Lab Results  Component Value Date   INSULIN 19.0 05/28/2019   Lab Results  Component Value Date   HGBA1C 4.8 05/28/2019   At risk for diabetes mellitus. Albert [name] is at higher than average risk for developing diabetes due to his obesity.   Assessment/Plan:   Other constipation. Albert [name] was informed that a decrease in bowel movement frequency is normal while losing weight, but stools should not be hard or painful. Orders and follow up as documented in patient record. He was given a refill on his Albert [name] capsule #30 with 0 refills.  Counseling Getting to Good Bowel Health: Your goal is to have one soft bowel movement each day. Drink at least 8 glasses of water each day. Eat plenty of fiber (goal is over 25 grams each day). It is best to get most of your fiber from dietary sources  which includes leafy green vegetables, fresh fruit, and whole grains. You may need to add fiber with the help of OTC fiber supplements. These include Metamucil, Citrucel, and Flaxseed. If you are still having trouble, try adding Miralax or Magnesium Citrate. If all of these changes do not work, Cabin crew.    Insulin resistance. Albert [name] will continue to work on weight loss, exercise, and decreasing simple carbohydrates to help decrease the risk of diabetes. Albert [name] agreed to follow-up with [name] as directed to closely monitor his progress. He was given a refill on his metFORMIN (GLUCOPHAGE) 500 MG tablet #90 with 0 refills.  At risk for diabetes mellitus. Albert [name] was given approximately 15 minutes of diabetes education and counseling today. We discussed intensive lifestyle modifications today with an emphasis on weight loss as well as increasing exercise and decreasing simple carbohydrates in his diet. We also reviewed medication options with an emphasis on risk versus benefit of those discussed.   Repetitive spaced learning was employed today to elicit superior memory formation and behavioral change.  Class 1 obesity with serious comorbidity and body mass index (BMI) of 33.0 to 33.9 in adult, unspecified obesity type.  Albert [name] is currently in the action stage of change. As such, his goal is to continue with weight loss efforts. He has agreed to the Category 4 Plan.   Exercise goals: For substantial health benefits, adults should do at least 150 minutes (2 hours and 30 minutes) a week of moderate-intensity, or 75 minutes (1  hour and 15 minutes) a week of vigorous-intensity aerobic physical activity, or an equivalent combination of moderate- and vigorous-intensity aerobic activity. Aerobic activity should be performed in episodes of at least 10 minutes, and preferably, it should be spread throughout the week.  Behavioral modification strategies: meal planning and cooking strategies and planning for  success.  Albert [name] has agreed to follow-up with our clinic in 3 weeks. He was informed of the importance of frequent follow-up visits to maximize his success with intensive lifestyle modifications for his multiple health conditions.   Objective:   Blood pressure 110/72, pulse 82, temperature 98.8 F (37.1 C), temperature source Oral, height 5\' 8"  (1.727 m), weight 223 lb (101.2 kg), SpO2 99 %. Body mass index is 33.91 kg/m.  General: Cooperative, alert, well developed, in no acute distress. HEENT: Conjunctivae and lids unremarkable. Cardiovascular: Regular rhythm.  Lungs: Normal work of breathing. Neurologic: No focal deficits.   Lab Results  Component Value Date   CREATININE 0.94 05/28/2019   BUN 14 05/28/2019   Albert [name] 144 05/28/2019   K 4.6 05/28/2019   CL 104 05/28/2019   CO2 24 05/28/2019   Lab Results  Component Value Date   ALT 57 (H) 05/28/2019   AST 46 (H) 05/28/2019   ALKPHOS 71 05/28/2019   BILITOT 0.3 05/28/2019   Lab Results  Component Value Date   HGBA1C 4.8 05/28/2019   Lab Results  Component Value Date   INSULIN 19.0 05/28/2019   Lab Results  Component Value Date   TSH 0.559 05/28/2019   Lab Results  Component Value Date   CHOL 137 05/28/2019   HDL 51 05/28/2019   LDLCALC 70 05/28/2019   TRIG 83 05/28/2019   CHOLHDL 4 09/26/2018   Lab Results  Component Value Date   WBC 7.1 05/28/2019   HGB 15.6 05/28/2019   HCT 45.7 05/28/2019   MCV 88 05/28/2019   PLT 266 05/28/2019   No results found for: IRON, TIBC, FERRITIN  Attestation Statements:   Reviewed by clinician on day of visit: allergies, medications, problem list, medical history, surgical history, family history, social history, and previous encounter notes.  IMichaelene Song, am acting as transcriptionist for Abby Potash, PA-C   I have reviewed the above documentation for accuracy and completeness, and I agree with the above. Abby Potash, PA-C

## 2019-09-17 DIAGNOSIS — F411 Generalized anxiety disorder: Secondary | ICD-10-CM | POA: Diagnosis not present

## 2019-09-17 DIAGNOSIS — F331 Major depressive disorder, recurrent, moderate: Secondary | ICD-10-CM | POA: Diagnosis not present

## 2019-09-17 MED FILL — CHLORDIAZEPOXIDE 10 MG CAP: 10 | 90 days supply | Qty: 90 | Fill #0

## 2019-09-17 MED FILL — LITHIUM CARBONATE 300 MG CA: 300 | 90 days supply | Qty: 450 | Fill #0

## 2019-09-23 MED FILL — PHENELZINE SULFATE 15 MG TA: 15 | 90 days supply | Qty: 540 | Fill #0

## 2019-09-30 ENCOUNTER — Ambulatory Visit (INDEPENDENT_AMBULATORY_CARE_PROVIDER_SITE_OTHER): Payer: 59 | Admitting: Physician Assistant

## 2019-09-30 ENCOUNTER — Encounter (INDEPENDENT_AMBULATORY_CARE_PROVIDER_SITE_OTHER): Payer: Self-pay | Admitting: Physician Assistant

## 2019-09-30 ENCOUNTER — Other Ambulatory Visit: Payer: Self-pay

## 2019-09-30 VITALS — BP 119/79 | HR 91 | Temp 98.0°F | Ht 68.0 in | Wt 224.0 lb

## 2019-09-30 DIAGNOSIS — E8881 Metabolic syndrome: Secondary | ICD-10-CM

## 2019-09-30 DIAGNOSIS — Z6834 Body mass index (BMI) 34.0-34.9, adult: Secondary | ICD-10-CM

## 2019-09-30 DIAGNOSIS — E669 Obesity, unspecified: Secondary | ICD-10-CM | POA: Diagnosis not present

## 2019-09-30 NOTE — Progress Notes (Signed)
Chief Complaint:   OBESITY Albert [name] is here to discuss his progress with his obesity treatment plan along with follow-up of his obesity related diagnoses. Albert [name] is on the Category 4 Plan and states he is following his eating plan approximately 70% of the time. Albert [name] states he is weightlifting 75 minutes 3-4 times per week.  Today's visit was #: 9 Starting weight: 236 lbs Starting date: 05/28/2019 Today's weight: 224 lbs Today's date: 09/30/2019 Total lbs lost to date: 12 Total lbs lost since last in-office visit: 0  Interim History: Albert [name] reports that he has been over drinking his calories. He states he has done the best he can eating on plan while eating out. He reports having trouble losing weight since being on antipsychotics.  Subjective:   Insulin resistance. Albert [name] has a diagnosis of insulin resistance based on his elevated fasting insulin level >5. He continues to work on diet and exercise to decrease his risk of diabetes. He is on metformin. No nausea, vomiting, or diarrhea.  Lab Results  Component Value Date   INSULIN 19.0 05/28/2019   Lab Results  Component Value Date   HGBA1C 4.8 05/28/2019   Assessment/Plan:   Insulin resistance. Albert [name] will continue to work on weight loss, exercise, and decreasing simple carbohydrates to help decrease the risk of diabetes. Albert [name] agreed to follow-up with [name] as directed to closely monitor his progress. He will continue metformin as directed.  Class 1 obesity with serious comorbidity and body mass index (BMI) of 34.0 to 34.9 in adult, unspecified obesity type.  Albert [name] is currently in the action stage of change. As such, his goal is to continue with weight loss efforts. He has agreed to keeping a food journal and adhering to recommended goals of 1800 calories and 115 grams of protein daily.   Exercise goals: For substantial health benefits, adults should do at least 150 minutes (2 hours and 30 minutes) a week of  moderate-intensity, or 75 minutes (1 hour and 15 minutes) a week of vigorous-intensity aerobic physical activity, or an equivalent combination of moderate- and vigorous-intensity aerobic activity. Aerobic activity should be performed in episodes of at least 10 minutes, and preferably, it should be spread throughout the week.  Behavioral modification strategies: meal planning and cooking strategies and keeping healthy foods in the home.  Albert [name] has agreed to follow-up with our clinic in 2 weeks. He was informed of the importance of frequent follow-up visits to maximize his success with intensive lifestyle modifications for his multiple health conditions.   Objective:   Blood pressure 119/79, pulse 91, temperature 98 F (36.7 C), temperature source Oral, height 5\' 8"  (1.727 m), weight 224 lb (101.6 kg), SpO2 97 %. Body mass index is 34.06 kg/m.  General: Cooperative, alert, well developed, in no acute distress. HEENT: Conjunctivae and lids unremarkable. Cardiovascular: Regular rhythm.  Lungs: Normal work of breathing. Neurologic: No focal deficits.   Lab Results  Component Value Date   CREATININE 0.94 05/28/2019   BUN 14 05/28/2019   NA 144 05/28/2019   K 4.6 05/28/2019   CL 104 05/28/2019   CO2 24 05/28/2019   Lab Results  Component Value Date   ALT 57 (H) 05/28/2019   AST 46 (H) 05/28/2019   ALKPHOS 71 05/28/2019   BILITOT 0.3 05/28/2019   Lab Results  Component Value Date   HGBA1C 4.8 05/28/2019   Lab Results  Component Value Date   INSULIN 19.0 05/28/2019   Lab Results  Component Value  Date   TSH 0.559 05/28/2019   Lab Results  Component Value Date   CHOL 137 05/28/2019   HDL 51 05/28/2019   LDLCALC 70 05/28/2019   TRIG 83 05/28/2019   CHOLHDL 4 09/26/2018   Lab Results  Component Value Date   WBC 7.1 05/28/2019   HGB 15.6 05/28/2019   HCT 45.7 05/28/2019   MCV 88 05/28/2019   PLT 266 05/28/2019   No results found for: IRON, TIBC,  FERRITIN  Attestation Statements:   Reviewed by clinician on day of visit: allergies, medications, problem list, medical history, surgical history, family history, social history, and previous encounter notes.  Time spent on visit including pre-visit chart review and post-visit charting and care was 30 minutes.   IMichaelene Song, am acting as transcriptionist for Abby Potash, PA-C   I have reviewed the above documentation for accuracy and completeness, and I agree with the above. Abby Potash, PA-C

## 2019-10-11 ENCOUNTER — Encounter (INDEPENDENT_AMBULATORY_CARE_PROVIDER_SITE_OTHER): Payer: Self-pay | Admitting: Physician Assistant

## 2019-10-11 MED FILL — LINZESS 145 MCG CAPSULE: 145 | 30 days supply | Qty: 30 | Fill #0

## 2019-10-15 ENCOUNTER — Ambulatory Visit (INDEPENDENT_AMBULATORY_CARE_PROVIDER_SITE_OTHER): Payer: 59 | Admitting: Physician Assistant

## 2019-10-15 ENCOUNTER — Other Ambulatory Visit: Payer: Self-pay

## 2019-10-15 ENCOUNTER — Encounter (INDEPENDENT_AMBULATORY_CARE_PROVIDER_SITE_OTHER): Payer: Self-pay | Admitting: Physician Assistant

## 2019-10-15 VITALS — BP 108/72 | HR 100 | Temp 98.1°F | Ht 68.0 in | Wt 224.0 lb

## 2019-10-15 DIAGNOSIS — E8881 Metabolic syndrome: Secondary | ICD-10-CM | POA: Diagnosis not present

## 2019-10-15 DIAGNOSIS — Z9189 Other specified personal risk factors, not elsewhere classified: Secondary | ICD-10-CM

## 2019-10-15 DIAGNOSIS — Z6834 Body mass index (BMI) 34.0-34.9, adult: Secondary | ICD-10-CM | POA: Diagnosis not present

## 2019-10-15 DIAGNOSIS — E559 Vitamin D deficiency, unspecified: Secondary | ICD-10-CM | POA: Diagnosis not present

## 2019-10-15 DIAGNOSIS — E669 Obesity, unspecified: Secondary | ICD-10-CM | POA: Diagnosis not present

## 2019-10-16 MED ORDER — METFORMIN HCL 500 MG PO TABS: ORAL_TABLET | ORAL | 0 refills | Status: DC

## 2019-10-16 MED FILL — METFORMIN HCL 500 MG TABS: 500 | 30 days supply | Qty: 90 | Fill #0

## 2019-10-16 NOTE — Progress Notes (Signed)
Chief Complaint:   OBESITY Albert [name] is here to discuss his progress with his obesity treatment plan along with follow-up of his obesity related diagnoses. [name] is on the Category 4 Plan and states he is following his eating plan approximately 90% of the time. [name] states he is weightlifting/running 40-60 minutes 5 times per week.  Today's visit was #: 10 Starting weight: 236 lbs Starting date: 05/28/2019 Today's weight: 224 lbs Today's date: 10/15/2019 Total lbs lost to date: 12 Total lbs lost since last in-office visit: 0  Interim History: Albert [name] states that he has graduated and will be eating at his parent's  house often. He reports polyphagia throughout the day.  Subjective:   Insulin resistance. [name] has a diagnosis of insulin resistance based on his elevated fasting insulin level >5. He continues to work on diet and exercise to decrease his risk of diabetes. No nausea, vomiting, or diarrhea on metformin.  Lab Results  Component Value Date   INSULIN 19.0 05/28/2019   Lab Results  Component Value Date   HGBA1C 4.8 05/28/2019   Vitamin D deficiency.  Albert [name] is on OTC Vitamin D. No nausea, vomiting, or muscle weakness. Last Vitamin D 49.7 on 05/28/2019.  Risk for Nausea. Albert [name] is at risk of nausea secondary to metformin.  Assessment/Plan:   Insulin resistance. Albert [name] will continue to work on weight loss, exercise, and decreasing simple carbohydrates to help decrease the risk of diabetes. Albert [name] agreed to follow-up with [name] as directed to closely monitor his progress. Albert [name] was given a refill on his metFORMIN (GLUCOPHAGE) 500 MG tablet #90 with 0 refills.  Vitamin D deficiency. Low Vitamin D level contributes to fatigue and are associated with obesity, breast, and colon cancer. He agrees to continue to take Vitamin D and will follow-up for routine testing of Vitamin D, at least 2-3 times per year to avoid over-replacement.  Risk for Nausea. Albert [name]  was given approximately 15 minutes of nausea prevention counseling today. [name] is at risk for nausea due to his new or current medication. He was encouraged to titrate his medication slowly, make sure to stay hydrated, eat smaller portions throughout the day, and avoid high fat meals.   Class 1 obesity with serious comorbidity and body mass index (BMI) of 34.0 to 34.9 in adult, unspecified obesity type.  Albert [name] is currently in the action stage of change. As such, his goal is to continue with weight loss efforts. He has agreed to the Category 4 Plan and will journal 1800 calories + 115 grams of protein daily.  We discussed Saxenda. He will discuss with his psychiatrist to ensure monitoring of his kidneys and lithium levels regularly.   Exercise goals: For substantial health benefits, adults should do at least 150 minutes (2 hours and 30 minutes) a week of moderate-intensity, or 75 minutes (1 hour and 15 minutes) a week of vigorous-intensity aerobic physical activity, or an equivalent combination of moderate- and vigorous-intensity aerobic activity. Aerobic activity should be performed in episodes of at least 10 minutes, and preferably, it should be spread throughout the week.  Behavioral modification strategies: meal planning and cooking strategies and keeping healthy foods in the home.  Albert [name] has agreed to follow-up with our clinic in 2 weeks. He was informed of the importance of frequent follow-up visits to maximize his success with intensive lifestyle modifications for his multiple health conditions.   Objective:   Blood pressure 108/72, pulse 100, temperature 98.1 F (36.7 C), temperature source  Oral, height 5\' 8"  (1.727 m), weight 224 lb (101.6 kg), SpO2 97 %. Body mass index is 34.06 kg/m.  General: Cooperative, alert, well developed, in no acute distress. HEENT: Conjunctivae and lids unremarkable. Cardiovascular: Regular rhythm.  Lungs: Normal work of breathing. Neurologic: No focal  deficits.   Lab Results  Component Value Date   CREATININE 0.94 05/28/2019   BUN 14 05/28/2019   NA 144 05/28/2019   K 4.6 05/28/2019   CL 104 05/28/2019   CO2 24 05/28/2019   Lab Results  Component Value Date   ALT 57 (H) 05/28/2019   AST 46 (H) 05/28/2019   ALKPHOS 71 05/28/2019   BILITOT 0.3 05/28/2019   Lab Results  Component Value Date   HGBA1C 4.8 05/28/2019   Lab Results  Component Value Date   INSULIN 19.0 05/28/2019   Lab Results  Component Value Date   TSH 0.559 05/28/2019   Lab Results  Component Value Date   CHOL 137 05/28/2019   HDL 51 05/28/2019   LDLCALC 70 05/28/2019   TRIG 83 05/28/2019   CHOLHDL 4 09/26/2018   Lab Results  Component Value Date   WBC 7.1 05/28/2019   HGB 15.6 05/28/2019   HCT 45.7 05/28/2019   MCV 88 05/28/2019   PLT 266 05/28/2019   No results found for: IRON, TIBC, FERRITIN  Attestation Statements:   Reviewed by clinician on day of visit: allergies, medications, problem list, medical history, surgical history, family history, social history, and previous encounter notes.  IMichaelene [name], am acting as transcriptionist for Abby Potash, PA-C   I have reviewed the above documentation for accuracy and completeness, and I agree with the above. Abby Potash, PA-C

## 2019-10-23 ENCOUNTER — Other Ambulatory Visit (HOSPITAL_BASED_OUTPATIENT_CLINIC_OR_DEPARTMENT_OTHER): Payer: Self-pay | Admitting: Dermatology

## 2019-10-23 DIAGNOSIS — L738 Other specified follicular disorders: Secondary | ICD-10-CM | POA: Diagnosis not present

## 2019-10-23 DIAGNOSIS — L74519 Primary focal hyperhidrosis, unspecified: Secondary | ICD-10-CM | POA: Diagnosis not present

## 2019-10-23 MED FILL — OXYBUTYNIN CHLORIDE 5 MG TA: 5 | 30 days supply | Qty: 60 | Fill #0

## 2019-10-23 MED FILL — DOXYCYCLINE MONO 100 MG CAP: 100 | 14 days supply | Qty: 28 | Fill #0

## 2019-10-31 ENCOUNTER — Other Ambulatory Visit: Payer: Self-pay

## 2019-10-31 ENCOUNTER — Ambulatory Visit (INDEPENDENT_AMBULATORY_CARE_PROVIDER_SITE_OTHER): Payer: 59 | Admitting: Physician Assistant

## 2019-10-31 ENCOUNTER — Encounter (INDEPENDENT_AMBULATORY_CARE_PROVIDER_SITE_OTHER): Payer: Self-pay | Admitting: Physician Assistant

## 2019-10-31 VITALS — BP 104/60 | HR 74 | Temp 98.1°F | Ht 68.0 in | Wt 223.0 lb

## 2019-10-31 DIAGNOSIS — E8881 Metabolic syndrome: Secondary | ICD-10-CM

## 2019-10-31 DIAGNOSIS — E669 Obesity, unspecified: Secondary | ICD-10-CM | POA: Diagnosis not present

## 2019-10-31 DIAGNOSIS — K5909 Other constipation: Secondary | ICD-10-CM

## 2019-10-31 DIAGNOSIS — Z6834 Body mass index (BMI) 34.0-34.9, adult: Secondary | ICD-10-CM | POA: Diagnosis not present

## 2019-10-31 DIAGNOSIS — Z9189 Other specified personal risk factors, not elsewhere classified: Secondary | ICD-10-CM

## 2019-10-31 MED ORDER — SAXENDA 18 MG/3ML ~~LOC~~ SOPN: 3.0000 mg | PEN_INJECTOR | Freq: Every day | SUBCUTANEOUS | 0 refills | Status: DC

## 2019-10-31 MED ORDER — BD PEN NEEDLE NANO 2ND GEN 32G X 4 MM MISC: 1.0000 | Freq: Every day | 0 refills | Status: DC

## 2019-10-31 MED ORDER — LINACLOTIDE 145 MCG PO CAPS: 145.0000 ug | ORAL_CAPSULE | Freq: Every day | ORAL | 0 refills | Status: DC

## 2019-11-01 ENCOUNTER — Telehealth: Payer: Self-pay

## 2019-11-01 NOTE — Telephone Encounter (Signed)
Patient called in to speak with the nurse to see how he can get his immunization shot record report. Please call the patient back at [phone] to advise.

## 2019-11-01 NOTE — Telephone Encounter (Signed)
Printed report and called the patient to pickup at the front desk

## 2019-11-01 NOTE — Telephone Encounter (Signed)
Called the patient and we decided a good idea to schedule with PCP/appointment made for 11/04/19 at 10:15 AM

## 2019-11-04 ENCOUNTER — Other Ambulatory Visit: Payer: Self-pay

## 2019-11-04 ENCOUNTER — Ambulatory Visit: Payer: 59 | Admitting: Family Medicine

## 2019-11-04 ENCOUNTER — Encounter: Payer: Self-pay | Admitting: Family Medicine

## 2019-11-04 VITALS — BP 108/68 | HR 90 | Temp 96.3°F | Ht 68.5 in | Wt 228.4 lb

## 2019-11-04 DIAGNOSIS — Z111 Encounter for screening for respiratory tuberculosis: Secondary | ICD-10-CM

## 2019-11-04 NOTE — Patient Instructions (Addendum)
We will have your immunization form ready for pickup in the next couple days.  Return in 48-72 hrs to have your Tb skin test read.  Let us know if you need anything.

## 2019-11-04 NOTE — Addendum Note (Signed)
Addended by: Sharon Seller B on: 11/04/2019 10:43 AM   Modules accepted: Orders

## 2019-11-04 NOTE — Progress Notes (Signed)
Chief Complaint:   OBESITY Albert [name] is here to discuss his progress with his obesity treatment plan along with follow-up of his obesity related diagnoses. [name] is on the Category 4 Plan and states he is following his eating plan approximately 90% of the time. [name] states he is running/weighlifting 60-120 minutes 5 times per week.  Today's visit was #: 11 Starting weight: 236 lbs Starting date: 05/28/2019 Today's weight: 223 lbs Today's date: 10/31/2019 Total lbs lost to date: 13 Total lbs lost since last in-office visit: 1  Interim History: [name] reports his plan is going well. He is substituting Protein One bars when needed.  Subjective:   Other constipation. [name] reports no blood in stool or black stools. Constipation is secondary to his Psych medications.  Insulin resistance. [name] has a diagnosis of insulin resistance based on his elevated fasting insulin level >5. He continues to work on diet and exercise to decrease his risk of diabetes. [name] is on metformin. No nausea, vomiting, or diarrhea, but he reports polyphagia.  Lab Results  Component Value Date   INSULIN 19.0 05/28/2019   Lab Results  Component Value Date   HGBA1C 4.8 05/28/2019   At risk for diabetes mellitus. [name] is at higher than average risk for developing diabetes due to his obesity.   Assessment/Plan:   Other constipation. [name] was informed that a decrease in bowel movement frequency is normal while losing weight, but stools should not be hard or painful. Orders and follow up as documented in patient record. Refill was given for linaclotide (LINZESS) 145 MCG CAPS capsule #90 with 0 refills.  Counseling Getting to Good Bowel Health: Your goal is to have one soft bowel movement each day. Drink at least 8 glasses of water each day. Eat plenty of fiber (goal is over 25 grams each day). It is best to get most of your fiber from dietary sources which includes leafy green vegetables,  fresh fruit, and whole grains. You may need to add fiber with the help of OTC fiber supplements. These include Metamucil, Citrucel, and Flaxseed. If you are still having trouble, try adding Miralax or Magnesium Citrate. If all of these changes do not work, Cabin crew.  Insulin resistance. [name] will continue to work on weight loss, exercise, and decreasing simple carbohydrates to help decrease the risk of diabetes. [name] agreed to follow-up with [name] as directed to closely monitor his progress. He will discontinue metformin and will start Liraglutide - Weight Management (SAXENDA) 18 MG/3ML SOPN 0.6 mg daily #5 pens. Prescription was given for Insulin Pen Needle (BD PEN NEEDLE NANO 2ND GEN) 32G X 4 MM MISC #100 with 0 refills.  At risk for diabetes mellitus. [name] was given approximately 15 minutes of diabetes education and counseling today. We discussed intensive lifestyle modifications today with an emphasis on weight loss as well as increasing exercise and decreasing simple carbohydrates in his diet. We also reviewed medication options with an emphasis on risk versus benefit of those discussed.   Repetitive spaced learning was employed today to elicit superior memory formation and behavioral change.  Class 1 obesity with serious comorbidity and body mass index (BMI) of 34.0 to 34.9 in adult, unspecified obesity type.  [name] is currently in the action stage of change. As such, his goal is to continue with weight loss efforts. He has agreed to the Category 4 Plan.   Exercise goals: For substantial health benefits, adults should do at least 150 minutes (2 hours  and 30 minutes) a week of moderate-intensity, or 75 minutes (1 hour and 15 minutes) a week of vigorous-intensity aerobic physical activity, or an equivalent combination of moderate- and vigorous-intensity aerobic activity. Aerobic activity should be performed in episodes of at least 10 minutes, and preferably, it should be spread  throughout the week.  Behavioral modification strategies: meal planning and cooking strategies and keeping healthy foods in the home.  [name] has agreed to follow-up with our clinic in 2-3 weeks. He was informed of the importance of frequent follow-up visits to maximize his success with intensive lifestyle modifications for his multiple health conditions.   Objective:   Blood pressure 104/60, pulse 74, temperature 98.1 F (36.7 C), temperature source Oral, height 5\' 8"  (1.727 m), weight 223 lb (101.2 kg), SpO2 99 %. Body mass index is 33.91 kg/m.  General: Cooperative, alert, well developed, in no acute distress. HEENT: Conjunctivae and lids unremarkable. Cardiovascular: Regular rhythm.  Lungs: Normal work of breathing. Neurologic: No focal deficits.   Lab Results  Component Value Date   CREATININE 0.94 05/28/2019   BUN 14 05/28/2019   NA 144 05/28/2019   K 4.6 05/28/2019   CL 104 05/28/2019   CO2 24 05/28/2019   Lab Results  Component Value Date   ALT 57 (H) 05/28/2019   AST 46 (H) 05/28/2019   ALKPHOS 71 05/28/2019   BILITOT 0.3 05/28/2019   Lab Results  Component Value Date   HGBA1C 4.8 05/28/2019   Lab Results  Component Value Date   INSULIN 19.0 05/28/2019   Lab Results  Component Value Date   TSH 0.559 05/28/2019   Lab Results  Component Value Date   CHOL 137 05/28/2019   HDL 51 05/28/2019   LDLCALC 70 05/28/2019   TRIG 83 05/28/2019   CHOLHDL 4 09/26/2018   Lab Results  Component Value Date   WBC 7.1 05/28/2019   HGB 15.6 05/28/2019   HCT 45.7 05/28/2019   MCV 88 05/28/2019   PLT 266 05/28/2019   No results found for: IRON, TIBC, FERRITIN  Attestation Statements:   Reviewed by clinician on day of visit: allergies, medications, problem list, medical history, surgical history, family history, social history, and previous encounter notes.  IMichaelene Song, am acting as transcriptionist for Abby Potash, PA-C   I have reviewed the above  documentation for accuracy and completeness, and I agree with the above. Abby Potash, PA-C

## 2019-11-04 NOTE — Progress Notes (Signed)
Chief Complaint  Patient presents with  . Follow-up    Subjective: Patient is a 24 y.o. male here for f/u.  Pt starting EMT training in 2 weeks. Needs immunization form proving Varicella, MMR, covid, and Hep B vaccinations, which he has had. He also requires a Tb skin test.  Past Medical History:  Diagnosis Date  . Anxiety with depression 09/30/2016  . Bipolar disorder ([place])   . Constipation   . History of PSVT (paroxysmal supraventricular tachycardia)   . Hyperhidrosis   . IBS (irritable bowel syndrome)   . Joint pain   . Low back pain   . Major depression, melancholic type   . Schizophrenia ([place])   . Thyroid disorder   . Vitamin D deficiency     Objective: BP 108/68 (BP Location: Left Arm, Patient Position: Sitting, Cuff Size: Large)   Pulse 90   Temp (!) 96.3 F (35.7 C) (Temporal)   Ht 5' 8.5" (1.74 m)   Wt 228 lb 6 oz (103.6 kg)   SpO2 96%   BMI 34.22 kg/m  General: Awake, appears stated age Lungs: No accessory muscle use Psych: Age appropriate judgment and insight, normal affect and mood  Assessment and Plan: Screening-pulmonary TB   Tb skin test today. We will update his immunization hx and have the list printed for him when he returns in 48-72 hrs to have his skin test read. F/u prn otherwise. The patient voiced understanding and agreement to the plan.  [place], DO 11/04/19  10:34 AM

## 2019-11-06 ENCOUNTER — Encounter (INDEPENDENT_AMBULATORY_CARE_PROVIDER_SITE_OTHER): Payer: Self-pay | Admitting: Physician Assistant

## 2019-11-06 ENCOUNTER — Telehealth (INDEPENDENT_AMBULATORY_CARE_PROVIDER_SITE_OTHER): Payer: Self-pay | Admitting: Physician Assistant

## 2019-11-06 ENCOUNTER — Ambulatory Visit: Payer: 59

## 2019-11-06 LAB — TB SKIN TEST
Induration: 0 mm
TB Skin Test: NEGATIVE

## 2019-11-06 MED FILL — LINZESS 145 MCG CAPSULE: 145 | 30 days supply | Qty: 30 | Fill #1

## 2019-11-06 NOTE — Telephone Encounter (Signed)
My chart message responded to

## 2019-11-08 ENCOUNTER — Ambulatory Visit: Payer: 59 | Admitting: Family Medicine

## 2019-11-14 ENCOUNTER — Ambulatory Visit (INDEPENDENT_AMBULATORY_CARE_PROVIDER_SITE_OTHER): Payer: 59 | Admitting: Family Medicine

## 2019-11-20 MED FILL — LIOTHYRONINE SODIUM 5 MCG T: 5 | 90 days supply | Qty: 90 | Fill #3

## 2019-11-20 MED FILL — OXYBUTYNIN CHLORIDE 5 MG TA: 5 | 30 days supply | Qty: 60 | Fill #1

## 2019-11-27 MED FILL — LEVOTHYROXINE SODIUM 88 MCG: 88 | 90 days supply | Qty: 90 | Fill #3

## 2019-12-15 ENCOUNTER — Encounter (INDEPENDENT_AMBULATORY_CARE_PROVIDER_SITE_OTHER): Payer: Self-pay | Admitting: Physician Assistant

## 2019-12-16 ENCOUNTER — Ambulatory Visit (INDEPENDENT_AMBULATORY_CARE_PROVIDER_SITE_OTHER): Payer: 59 | Admitting: Physician Assistant

## 2019-12-17 DIAGNOSIS — R5382 Chronic fatigue, unspecified: Secondary | ICD-10-CM | POA: Diagnosis not present

## 2019-12-17 DIAGNOSIS — F411 Generalized anxiety disorder: Secondary | ICD-10-CM | POA: Diagnosis not present

## 2019-12-17 DIAGNOSIS — G4730 Sleep apnea, unspecified: Secondary | ICD-10-CM | POA: Diagnosis not present

## 2019-12-17 DIAGNOSIS — F331 Major depressive disorder, recurrent, moderate: Secondary | ICD-10-CM | POA: Diagnosis not present

## 2019-12-17 MED FILL — CHLORDIAZEPOXIDE 10 MG CAP: 10 | 90 days supply | Qty: 90 | Fill #0

## 2019-12-17 MED FILL — LITHIUM CARBONATE 300 MG CA: 300 | 90 days supply | Qty: 450 | Fill #0

## 2019-12-23 MED FILL — PHENELZINE SULFATE 15 MG TA: 15 | 90 days supply | Qty: 540 | Fill #0

## 2019-12-23 MED FILL — SAXENDA 18 MG/3 ML PEN: 18 | 30 days supply | Qty: 15 | Fill #0

## 2019-12-24 ENCOUNTER — Ambulatory Visit (INDEPENDENT_AMBULATORY_CARE_PROVIDER_SITE_OTHER): Payer: 59 | Admitting: Physician Assistant

## 2019-12-24 ENCOUNTER — Other Ambulatory Visit: Payer: Self-pay

## 2019-12-24 ENCOUNTER — Other Ambulatory Visit (HOSPITAL_BASED_OUTPATIENT_CLINIC_OR_DEPARTMENT_OTHER): Payer: Self-pay | Admitting: Physician Assistant

## 2019-12-24 ENCOUNTER — Encounter (INDEPENDENT_AMBULATORY_CARE_PROVIDER_SITE_OTHER): Payer: Self-pay | Admitting: Physician Assistant

## 2019-12-24 VITALS — BP 107/70 | HR 60 | Temp 98.0°F | Ht 68.0 in | Wt 226.0 lb

## 2019-12-24 DIAGNOSIS — E669 Obesity, unspecified: Secondary | ICD-10-CM

## 2019-12-24 DIAGNOSIS — K5909 Other constipation: Secondary | ICD-10-CM

## 2019-12-24 DIAGNOSIS — E8881 Metabolic syndrome: Secondary | ICD-10-CM | POA: Diagnosis not present

## 2019-12-24 DIAGNOSIS — Z6834 Body mass index (BMI) 34.0-34.9, adult: Secondary | ICD-10-CM

## 2019-12-24 DIAGNOSIS — Z9189 Other specified personal risk factors, not elsewhere classified: Secondary | ICD-10-CM | POA: Diagnosis not present

## 2019-12-24 MED ORDER — LINACLOTIDE 145 MCG PO CAPS: 145.0000 ug | ORAL_CAPSULE | Freq: Every day | ORAL | 0 refills | Status: DC

## 2019-12-24 MED FILL — LINZESS 145 MCG CAPSULE: 145 | 30 days supply | Qty: 30 | Fill #0

## 2019-12-25 NOTE — Progress Notes (Signed)
Chief Complaint:   Albert [name] is here to discuss his progress with his Albert treatment plan along with follow-up of his Albert related diagnoses. Albert [name] is on the Category 4 Plan and states he is following his eating plan approximately 50% of the time. [name] states he is walking/gym-weights 60 minutes 5 times per week.  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 He is going to be living in [facility] and will be cooking more often.  Subjective:   Other constipation. Bosten reports no blood in stool or black stools. Constipation is due to his psych medications.  Insulin resistance. Albert [name] has a diagnosis of insulin resistance based on his elevated fasting insulin level >5. He continues to work on diet and exercise to decrease his risk of diabetes. Albert [name] has not yet started GLP-1. He reports polyphagia.  Lab Results  Component Value Date   INSULIN 19.0 05/28/2019   Lab Results  Component Value Date   HGBA1C 4.8 05/28/2019   At risk for hypoglycemia. Amar is at increased risk for hypoglycemia due to changes in diet, diagnosis of diabetes, and/or insulin use.  Assessment/Plan:   Other constipation. Albert [name] was informed that a decrease in bowel movement frequency is normal while losing weight, but stools should not be hard or painful. Orders and follow up as documented in patient record. Prescription was given for linaclotide (LINZESS) 145 MCG CAPS capsule #30 with 0 refills.  Counseling Getting to Good Bowel Health: Your goal is to have one soft bowel movement each day. Drink at least 8 glasses of water each day. Eat plenty of fiber (goal is over 25 grams each day). It is best to get most of your fiber from  dietary sources which includes leafy green vegetables, fresh fruit, and whole grains. You may need to add fiber with the help of OTC fiber supplements. These include Metamucil, Citrucel, and Flaxseed. If you are still having trouble, try adding Miralax or Magnesium Citrate. If all of these changes do not work, Cabin crew.   Insulin resistance. [name] will continue to work on weight loss, exercise, and decreasing simple carbohydrates to help decrease the risk of diabetes. Zyden agreed to follow-up with [name] as directed to closely monitor his progress. He will start GLP-1 tomorrow.  At risk for hypoglycemia. Albert [name] was given approximately 15 minutes of counseling today regarding prevention of hypoglycemia. He was advised of symptoms of hypoglycemia. [name] was instructed to avoid skipping meals, eat regular protein rich meals and schedule low calorie snacks as needed.   Repetitive spaced learning was employed today to elicit superior memory formation and behavioral change.  Class 1 Albert with serious comorbidity and body mass index (BMI) of 34.0 to 34.9 in adult, unspecified Albert type.  Albert [name] is currently in the action stage of change. As such, his goal is to continue with weight loss efforts. He has agreed to the Category 4 Plan.   Exercise goals: For substantial health benefits, adults should do at least 150 minutes (2 hours and 30 minutes) a week of moderate-intensity, or 75 minutes (1 hour and 15 minutes) a week of vigorous-intensity aerobic physical activity, or an equivalent combination of moderate- and vigorous-intensity aerobic activity. Aerobic activity should  be performed in episodes of at least 10 minutes, and preferably, it should be spread throughout the week.  Behavioral modification strategies: meal planning and cooking strategies and keeping healthy foods in the home.  Albert [name] has agreed to follow-up with our clinic in 2-3 weeks. He was informed of the importance of  frequent follow-up visits to maximize his success with intensive lifestyle modifications for his multiple health conditions.   Objective:   Blood pressure 107/70, pulse 60, temperature 98 F (36.7 C), temperature source Oral, height 5\' 8"  (1.727 m), weight 226 lb (102.5 kg), SpO2 98 %. Body mass index is 34.36 kg/m.  General: Cooperative, alert, well developed, in no acute distress. HEENT: Conjunctivae and lids unremarkable. Cardiovascular: Regular rhythm.  Lungs: Normal work of breathing. Neurologic: No focal deficits.   Lab Results  Component Value Date   CREATININE 0.94 05/28/2019   BUN 14 05/28/2019   NA 144 05/28/2019   K 4.6 05/28/2019   CL 104 05/28/2019   CO2 24 05/28/2019   Lab Results  Component Value Date   ALT 57 (H) 05/28/2019   AST 46 (H) 05/28/2019   ALKPHOS 71 05/28/2019   BILITOT 0.3 05/28/2019   Lab Results  Component Value Date   HGBA1C 4.8 05/28/2019   Lab Results  Component Value Date   INSULIN 19.0 05/28/2019   Lab Results  Component Value Date   TSH 0.559 05/28/2019   Lab Results  Component Value Date   CHOL 137 05/28/2019   HDL 51 05/28/2019   LDLCALC 70 05/28/2019   TRIG 83 05/28/2019   CHOLHDL 4 09/26/2018   Lab Results  Component Value Date   WBC 7.1 05/28/2019   HGB 15.6 05/28/2019   HCT 45.7 05/28/2019   MCV 88 05/28/2019   PLT 266 05/28/2019   No results found for: IRON, TIBC, FERRITIN  Attestation Statements:   Reviewed by clinician on day of visit: allergies, medications, problem list, medical history, surgical history, family history, social history, and previous encounter notes.  IMichaelene Song, am acting as transcriptionist for Abby Potash, PA-C   I have reviewed the above documentation for accuracy and completeness, and I agree with the above. Abby Potash, PA-C

## 2020-01-03 ENCOUNTER — Encounter (INDEPENDENT_AMBULATORY_CARE_PROVIDER_SITE_OTHER): Payer: Self-pay | Admitting: Physician Assistant

## 2020-01-06 NOTE — Telephone Encounter (Signed)
Please advise. Thanks.  

## 2020-01-09 ENCOUNTER — Ambulatory Visit (INDEPENDENT_AMBULATORY_CARE_PROVIDER_SITE_OTHER): Payer: 59 | Admitting: Physician Assistant

## 2020-01-09 ENCOUNTER — Other Ambulatory Visit: Payer: Self-pay

## 2020-01-09 ENCOUNTER — Encounter (INDEPENDENT_AMBULATORY_CARE_PROVIDER_SITE_OTHER): Payer: Self-pay | Admitting: Physician Assistant

## 2020-01-09 VITALS — BP 96/65 | HR 83 | Temp 97.9°F | Ht 68.0 in | Wt 223.0 lb

## 2020-01-09 DIAGNOSIS — E559 Vitamin D deficiency, unspecified: Secondary | ICD-10-CM | POA: Diagnosis not present

## 2020-01-09 DIAGNOSIS — E669 Obesity, unspecified: Secondary | ICD-10-CM

## 2020-01-09 DIAGNOSIS — Z6834 Body mass index (BMI) 34.0-34.9, adult: Secondary | ICD-10-CM | POA: Diagnosis not present

## 2020-01-09 DIAGNOSIS — E038 Other specified hypothyroidism: Secondary | ICD-10-CM

## 2020-01-13 ENCOUNTER — Ambulatory Visit (INDEPENDENT_AMBULATORY_CARE_PROVIDER_SITE_OTHER): Payer: 59 | Admitting: Physician Assistant

## 2020-01-13 NOTE — Progress Notes (Signed)
Chief Complaint:   Albert [name] is here to discuss his progress with his Albert treatment plan along with follow-up of his Albert related diagnoses. Albert [name] is on the Category 4 Plan and states he is following his eating plan approximately 75% of the time. Albert [name] states he is going to the gym 60 minutes 6 times per week.  Today's visit was #: 13 Starting weight: 236 lbs Starting date: 05/28/2019 Today's weight: 223 lbs Today's date: 01/09/2020 Total lbs lost to date: 13 Total lbs lost since last in-office visit: 3  Interim History: Albert [name] reports that Albert [name] has helped reduce his appetite and, therefore, he has been able to control his portions better. He is not weighing his protein. He continues to eat out often.  Subjective:   Vitamin D deficiency. No nausea, vomiting, or muscle weakness on Vitamin D supplementation.    Ref. Range 05/28/2019 11:31  Vitamin D, 25-Hydroxy Latest Ref Range: 30.0 - 100.0 ng/mL 49.7   Other specified hypothyroidism. Albert [name] is managed by his PCP. He denies excessive fatigue on levothyroxine.  Lab Results  Component Value Date   TSH 0.559 05/28/2019   Assessment/Plan:   Vitamin D deficiency. Low Vitamin D level contributes to fatigue and are associated with Albert, breast, and colon cancer. He agrees to continue to take Vitamin D as directed and will follow-up for routine testing of Vitamin D, at least 2-3 times per year to avoid over-replacement.  Other specified hypothyroidism. Patient with long-standing hypothyroidism, on levothyroxine therapy. He appears euthyroid. Orders and follow up as documented in patient record. Albert [name] will continue his medication as directed and follow-up with his PCP as scheduled.  Counseling  Good thyroid control is important for overall health. Supratherapeutic thyroid levels are dangerous and will not improve weight loss results.  The correct way to take levothyroxine is fasting, with water,  separated by at least 30 minutes from breakfast, and separated by more than 4 hours from calcium, iron, multivitamins, acid reflux medications (PPIs).   Class 1 Albert with serious comorbidity and body mass index (BMI) of 34.0 to 34.9 in adult, unspecified Albert type.  Albert [name] is currently in the action stage of change. As such, his goal is to continue with weight loss efforts. He has agreed to the Category 4 Plan.   Exercise goals: For substantial health benefits, adults should do at least 150 minutes (2 hours and 30 minutes) a week of moderate-intensity, or 75 minutes (1 hour and 15 minutes) a week of vigorous-intensity aerobic physical activity, or an equivalent combination of moderate- and vigorous-intensity aerobic activity. Aerobic activity should be performed in episodes of at least 10 minutes, and preferably, it should be spread throughout the week.  Behavioral modification strategies: meal planning and cooking strategies and keeping healthy foods in the home.  Albert [name] has agreed to follow-up with our clinic fasting in 2-3 weeks. He was informed of the importance of frequent follow-up visits to maximize his success with intensive lifestyle modifications for his multiple health conditions.   Objective:   Blood pressure 96/65, pulse 83, temperature 97.9 F (36.6 C), temperature source Oral, height 5\' 8"  (1.727 m), weight 223 lb (101.2 kg), SpO2 97 %. Body mass index is 33.91 kg/m.  General: Cooperative, alert, well developed, in no acute distress. HEENT: Conjunctivae and lids unremarkable. Cardiovascular: Regular rhythm.  Lungs: Normal work of breathing. Neurologic: No focal deficits.   Lab Results  Component Value Date   CREATININE 0.94 05/28/2019   BUN 14  05/28/2019   NA 144 05/28/2019   K 4.6 05/28/2019   CL 104 05/28/2019   CO2 24 05/28/2019   Lab Results  Component Value Date   ALT 57 (H) 05/28/2019   AST 46 (H) 05/28/2019   ALKPHOS 71 05/28/2019   BILITOT 0.3  05/28/2019   Lab Results  Component Value Date   HGBA1C 4.8 05/28/2019   Lab Results  Component Value Date   INSULIN 19.0 05/28/2019   Lab Results  Component Value Date   TSH 0.559 05/28/2019   Lab Results  Component Value Date   CHOL 137 05/28/2019   HDL 51 05/28/2019   LDLCALC 70 05/28/2019   TRIG 83 05/28/2019   CHOLHDL 4 09/26/2018   Lab Results  Component Value Date   WBC 7.1 05/28/2019   HGB 15.6 05/28/2019   HCT 45.7 05/28/2019   MCV 88 05/28/2019   PLT 266 05/28/2019   No results found for: IRON, TIBC, FERRITIN  Attestation Statements:   Reviewed by clinician on day of visit: allergies, medications, problem list, medical history, surgical history, family history, social history, and previous encounter notes.  Time spent on visit including pre-visit chart review and post-visit charting and care was 31 minutes.   IMichaelene [name], am acting as transcriptionist for Albert Potash, PA-C   I have reviewed the above documentation for accuracy and completeness, and I agree with the above. Albert Potash, PA-C

## 2020-01-30 MED FILL — OXYBUTYNIN CHLORIDE 5 MG TA: 5 | 30 days supply | Qty: 60 | Fill #2

## 2020-02-03 ENCOUNTER — Encounter (INDEPENDENT_AMBULATORY_CARE_PROVIDER_SITE_OTHER): Payer: Self-pay | Admitting: Physician Assistant

## 2020-02-03 ENCOUNTER — Ambulatory Visit (INDEPENDENT_AMBULATORY_CARE_PROVIDER_SITE_OTHER): Payer: 59 | Admitting: Physician Assistant

## 2020-02-03 ENCOUNTER — Other Ambulatory Visit: Payer: Self-pay

## 2020-02-03 VITALS — BP 98/65 | HR 74 | Temp 98.0°F | Ht 68.0 in | Wt 220.0 lb

## 2020-02-03 DIAGNOSIS — Z6833 Body mass index (BMI) 33.0-33.9, adult: Secondary | ICD-10-CM | POA: Diagnosis not present

## 2020-02-03 DIAGNOSIS — E038 Other specified hypothyroidism: Secondary | ICD-10-CM

## 2020-02-03 DIAGNOSIS — E8881 Metabolic syndrome: Secondary | ICD-10-CM | POA: Diagnosis not present

## 2020-02-03 DIAGNOSIS — Z6834 Body mass index (BMI) 34.0-34.9, adult: Secondary | ICD-10-CM | POA: Diagnosis not present

## 2020-02-03 DIAGNOSIS — Z9189 Other specified personal risk factors, not elsewhere classified: Secondary | ICD-10-CM | POA: Diagnosis not present

## 2020-02-03 DIAGNOSIS — E785 Hyperlipidemia, unspecified: Secondary | ICD-10-CM | POA: Diagnosis not present

## 2020-02-03 DIAGNOSIS — E669 Obesity, unspecified: Secondary | ICD-10-CM | POA: Diagnosis not present

## 2020-02-03 DIAGNOSIS — E559 Vitamin D deficiency, unspecified: Secondary | ICD-10-CM

## 2020-02-03 NOTE — Progress Notes (Signed)
Chief Complaint:   OBESITY Albert [name] is here to discuss his progress with his obesity treatment plan along with follow-up of his obesity related diagnoses. [name] is on the Category 4 Plan and states he is following his eating plan approximately 80-85% of the time. [name] states he is doing weights and walking for 90 minutes 7 times per week.  Today's visit was #: 14 Starting weight: 236 lbs Starting date: 05/28/2019 Today's weight: 220 lbs Today's date: 02/03/2020 Total lbs lost to date: 16 Total lbs lost since last in-office visit: 3  Interim History: [name] continues to eat out often due to having an apartment without a kitchen. His hunger is controlled most days. He is going to have a sleep study soon.  Subjective:   1. Dyslipidemia [name]'s last lipid panel was at goal. He is on numerous psych medications.  2. Vitamin D deficiency [name] is on Vit D 5,000 IU daily, and he is tolerating it well.  3. Insulin resistance [name]'s last level was not at goal. His hunger is better controlled on Saxenda.  4. Other specified hypothyroidism [name] notes excessive fatigue. Last thyroid panel was at goal.  5. At risk for heart disease [name] is at a higher than average risk for cardiovascular disease due to obesity.   Assessment/Plan:   1. Dyslipidemia Cardiovascular risk and specific lipid/LDL goals reviewed. We discussed several lifestyle modifications today. [name] will continue to work on diet, exercise and weight loss efforts. We will check labs today. Orders and follow up as documented in patient record.   Counseling Intensive lifestyle modifications are the first line treatment for this issue.  Dietary changes: Increase soluble fiber. Decrease simple carbohydrates.  Exercise changes: Moderate to vigorous-intensity aerobic activity 150 minutes per week if tolerated.  Lipid-lowering medications: see documented in medical record.  - Lipid panel  2. Vitamin D  deficiency Low Vitamin D level contributes to fatigue and are associated with obesity, breast, and colon cancer. We will check labs today. Cadence will follow-up for routine testing of Vitamin D, at least 2-3 times per year to avoid over-replacement.  - VITAMIN D 25 Hydroxy (Vit-D Deficiency, Fractures)  3. Insulin resistance [name] will continue to work on weight loss, exercise, and decreasing simple carbohydrates to help decrease the risk of diabetes. We will check labs today. [name] agreed to follow-up with [name] as directed to closely monitor his progress.  - Insulin, random - Hemoglobin A1c  4. Other specified hypothyroidism Patient with long-standing hypothyroidism, on levothyroxine therapy. [name] appears euthyroid. We will check labs today. Orders and follow up as documented in patient record.  Counseling  Good thyroid control is important for overall health. Supratherapeutic thyroid levels are dangerous and will not improve weight loss results.  The correct way to take levothyroxine is fasting, with water, separated by at least 30 minutes from breakfast, and separated by more than 4 hours from calcium, iron, multivitamins, acid reflux medications (PPIs).   - Thyroid Panel With TSH  5. At risk for heart disease [name] was given approximately 15 minutes of coronary artery disease prevention counseling today. He is 24 y.o. male and has risk factors for heart disease including obesity. We discussed intensive lifestyle modifications today with an emphasis on specific weight loss instructions and strategies.   Repetitive spaced learning was employed today to elicit superior memory formation and behavioral change.  6. Class 1 obesity with serious comorbidity and body mass index (BMI) of 33.0 to 33.9 in adult, unspecified obesity type  [name] is currently in the action stage of change. As such, his goal is to continue with weight loss efforts. He has agreed to the Category 4 Plan.   We will  check labs today - Comprehensive metabolic panel  We discussed various medication options to help [name] with his weight loss efforts and we both agreed to increase Saxenda to 1.8 mg (no script needed).  Exercise goals: As is.  Behavioral modification strategies: no skipping meals and meal planning and cooking strategies.  [name] has agreed to follow-up with our clinic in 3 weeks. He was informed of the importance of frequent follow-up visits to maximize his success with intensive lifestyle modifications for his multiple health conditions.   [name] was informed we would discuss his lab results at his next visit unless there is a critical issue that needs to be addressed sooner. [name] agreed to keep his next visit at the agreed upon time to discuss these results.  Objective:   Blood pressure 98/65, pulse 74, temperature 98 F (36.7 C), temperature source Oral, height 5\' 8"  (1.727 m), weight 220 lb (99.8 kg), SpO2 95 %. Body mass index is 33.45 kg/m.  General: Cooperative, alert, well developed, in no acute distress. HEENT: Conjunctivae and lids unremarkable. Cardiovascular: Regular rhythm.  Lungs: Normal work of breathing. Neurologic: No focal deficits.   Lab Results  Component Value Date   CREATININE 0.94 05/28/2019   BUN 14 05/28/2019   NA 144 05/28/2019   K 4.6 05/28/2019   CL 104 05/28/2019   CO2 24 05/28/2019   Lab Results  Component Value Date   ALT 57 (H) 05/28/2019   AST 46 (H) 05/28/2019   ALKPHOS 71 05/28/2019   BILITOT 0.3 05/28/2019   Lab Results  Component Value Date   HGBA1C 4.8 05/28/2019   Lab Results  Component Value Date   INSULIN 19.0 05/28/2019   Lab Results  Component Value Date   TSH 0.559 05/28/2019   Lab Results  Component Value Date   CHOL 137 05/28/2019   HDL 51 05/28/2019   LDLCALC 70 05/28/2019   TRIG 83 05/28/2019   CHOLHDL 4 09/26/2018   Lab Results  Component Value Date   WBC 7.1 05/28/2019   HGB 15.6 05/28/2019   HCT  45.7 05/28/2019   MCV 88 05/28/2019   PLT 266 05/28/2019   No results found for: IRON, TIBC, FERRITIN  Attestation Statements:   Reviewed by clinician on day of visit: allergies, medications, problem list, medical history, surgical history, family history, social history, and previous encounter notes.   Wilhemena Durie, am acting as transcriptionist for [employer], PA-C.  I have reviewed the above documentation for accuracy and completeness, and I agree with the above. Abby Potash, PA-C

## 2020-02-04 LAB — COMPREHENSIVE METABOLIC PANEL
ALT: 41 IU/L (ref 0–44)
AST: 33 IU/L (ref 0–40)
Albumin/Globulin Ratio: 1.8 (ref 1.2–2.2)
Albumin: 4.6 g/dL (ref 4.1–5.2)
Alkaline Phosphatase: 77 IU/L (ref 44–121)
BUN/Creatinine Ratio: 15 (ref 9–20)
BUN: 14 mg/dL (ref 6–20)
Bilirubin Total: 0.5 mg/dL (ref 0.0–1.2)
CO2: 25 mmol/L (ref 20–29)
Calcium: 10 mg/dL (ref 8.7–10.2)
Chloride: 102 mmol/L (ref 96–106)
Creatinine, Ser: 0.94 mg/dL (ref 0.76–1.27)
GFR calc Af Amer: 132 mL/min/{1.73_m2} (ref >59)
GFR calc non Af Amer: 114 mL/min/{1.73_m2} (ref >59)
Globulin, Total: 2.6 g/dL (ref 1.5–4.5)
Glucose: 66 mg/dL (ref 65–99)
Potassium: 4.7 mmol/L (ref 3.5–5.2)
Sodium: 140 mmol/L (ref 134–144)
Total Protein: 7.2 g/dL (ref 6.0–8.5)

## 2020-02-04 LAB — LIPID PANEL
Chol/HDL Ratio: 2.6 ratio (ref 0.0–5.0)
Cholesterol, Total: 118 mg/dL (ref 100–199)
HDL: 46 mg/dL (ref >39)
LDL Chol Calc (NIH): 53 mg/dL (ref 0–99)
Triglycerides: 105 mg/dL (ref 0–149)
VLDL Cholesterol Cal: 19 mg/dL (ref 5–40)

## 2020-02-04 LAB — INSULIN, RANDOM: INSULIN: 6.2 u[IU]/mL (ref 2.6–24.9)

## 2020-02-04 LAB — THYROID PANEL WITH TSH
Free Thyroxine Index: 3.1 (ref 1.2–4.9)
T3 Uptake Ratio: 34 % (ref 24–39)
T4, Total: 9.1 ug/dL (ref 4.5–12.0)
TSH: 0.346 u[IU]/mL — ABNORMAL LOW (ref 0.450–4.500)

## 2020-02-04 LAB — HEMOGLOBIN A1C
Est. average glucose Bld gHb Est-mCnc: 97 mg/dL
Hgb A1c MFr Bld: 5 % (ref 4.8–5.6)

## 2020-02-04 LAB — VITAMIN D 25 HYDROXY (VIT D DEFICIENCY, FRACTURES): Vit D, 25-Hydroxy: 65.6 ng/mL (ref 30.0–100.0)

## 2020-02-06 MED FILL — LINZESS 145 MCG CAPSULE: 145 | 90 days supply | Qty: 90 | Fill #0

## 2020-02-19 MED FILL — LIOTHYRONINE SODIUM 5 MCG T: 5 | 90 days supply | Qty: 90 | Fill #4

## 2020-02-24 ENCOUNTER — Ambulatory Visit (INDEPENDENT_AMBULATORY_CARE_PROVIDER_SITE_OTHER): Payer: 59 | Admitting: Family Medicine

## 2020-02-27 ENCOUNTER — Other Ambulatory Visit (HOSPITAL_BASED_OUTPATIENT_CLINIC_OR_DEPARTMENT_OTHER): Payer: Self-pay | Admitting: Endocrinology

## 2020-02-27 ENCOUNTER — Ambulatory Visit (INDEPENDENT_AMBULATORY_CARE_PROVIDER_SITE_OTHER): Payer: 59 | Admitting: Family Medicine

## 2020-02-27 MED FILL — LEVOTHYROXINE SODIUM 88 MCG: 88 | 90 days supply | Qty: 90 | Fill #0

## 2020-03-03 ENCOUNTER — Encounter (INDEPENDENT_AMBULATORY_CARE_PROVIDER_SITE_OTHER): Payer: Self-pay | Admitting: Family Medicine

## 2020-03-03 ENCOUNTER — Other Ambulatory Visit (HOSPITAL_BASED_OUTPATIENT_CLINIC_OR_DEPARTMENT_OTHER): Payer: Self-pay | Admitting: Endocrinology

## 2020-03-03 ENCOUNTER — Other Ambulatory Visit: Payer: Self-pay

## 2020-03-03 ENCOUNTER — Ambulatory Visit (INDEPENDENT_AMBULATORY_CARE_PROVIDER_SITE_OTHER): Payer: 59 | Admitting: Family Medicine

## 2020-03-03 VITALS — BP 111/76 | HR 89 | Temp 97.9°F | Ht 68.0 in | Wt 222.0 lb

## 2020-03-03 DIAGNOSIS — Z6833 Body mass index (BMI) 33.0-33.9, adult: Secondary | ICD-10-CM | POA: Diagnosis not present

## 2020-03-03 DIAGNOSIS — Z9189 Other specified personal risk factors, not elsewhere classified: Secondary | ICD-10-CM

## 2020-03-03 DIAGNOSIS — E038 Other specified hypothyroidism: Secondary | ICD-10-CM | POA: Diagnosis not present

## 2020-03-03 DIAGNOSIS — E669 Obesity, unspecified: Secondary | ICD-10-CM | POA: Diagnosis not present

## 2020-03-03 DIAGNOSIS — E88819 Insulin resistance, unspecified: Secondary | ICD-10-CM

## 2020-03-03 DIAGNOSIS — E8881 Metabolic syndrome: Secondary | ICD-10-CM

## 2020-03-03 DIAGNOSIS — E559 Vitamin D deficiency, unspecified: Secondary | ICD-10-CM | POA: Diagnosis not present

## 2020-03-03 MED ORDER — VITAMIN D-3 125 MCG (5000 UT) PO TABS: 10000.0000 [IU] | ORAL_TABLET | Freq: Every day | ORAL | 0 refills | Status: DC

## 2020-03-03 MED ORDER — SAXENDA 18 MG/3ML ~~LOC~~ SOPN: 3.0000 mg | PEN_INJECTOR | Freq: Every day | SUBCUTANEOUS | 0 refills | Status: DC

## 2020-03-03 MED FILL — LEVOTHYROXINE SODIUM 75 MCG: 75 | 30 days supply | Qty: 30 | Fill #0

## 2020-03-03 MED FILL — SAXENDA 18 MG/3 ML PEN: 18 | 30 days supply | Qty: 15 | Fill #0

## 2020-03-03 NOTE — Progress Notes (Signed)
Chief Complaint:   OBESITY Melbert is here to discuss his progress with his obesity treatment plan along with follow-up of his obesity related diagnoses. Mycah is on the Category 4 Plan and states he is following his eating plan approximately 80% of the time. Dallon states he is doing weights and walking for 90 minutes 5-6 times per week.  Today's visit was #: 15 Starting weight: 236 lbs Starting date: 05/28/2019 Today's weight: 222 lbs Today's date: 03/03/2020 Total lbs lost to date: 14 Total lbs lost since last in-office visit: 0  Interim History: May is on Saxenda 1.8 mg daily. He feels it is helping with hunger and cravings. He notes he does not like Category 4 plan but journaling was not effective for him. He tends to eat foods that are convenient at times, especially when he is with his friends.   Subjective:   1. Other specified hypothyroidism Zaki is managed by Endocrinology (Dr. Chalmers Cater). His last TSH was low, and he notes fatigue. He is on Cytomel and levothyroxine.   Lab Results  Component Value Date   TSH 0.346 (L) 02/03/2020   2. Vitamin D deficiency Hazael notes fatigue. He is on OTC Vit D 10,000 IU daily. His Vit D level was within normal limits.  3. Insulin resistance Nivaan has a diagnosis of insulin resistance based on his elevated fasting insulin level >5. He is on Saxenda, and last A1c was 5.0.   Lab Results  Component Value Date   INSULIN 6.2 02/03/2020   INSULIN 19.0 05/28/2019   Lab Results  Component Value Date   HGBA1C 5.0 02/03/2020   4. At risk for diabetes mellitus Manville is at higher than average risk for developing diabetes due to his obesity, insulin resistance, and being on psychotropic medications.   Assessment/Plan:   1. Other specified hypothyroidism  [name] will continue his current dose of Cytomel and levothyroxine, and will contact Dr. Chalmers Cater about low TSH.   2. Vitamin D deficiency . Jakye agreed to continue taking OTC  Vitamin D 10,000 IU daily. - Cholecalciferol (VITAMIN D-3) 125 MCG (5000 UT) TABS; Take 10,000 Int'l Units by mouth daily.  Dispense: 30 tablet; Refill: 0  3. Insulin resistance Able will continue Saxenda, and will continue to work on weight loss, exercise, and decreasing simple carbohydrates to help decrease the risk of diabetes.   4. At risk for diabetes mellitus Jerrald was given approximately 15 minutes of diabetes education and counseling today. We discussed intensive lifestyle modifications today with an emphasis on weight loss as well as increasing exercise and decreasing simple carbohydrates in his diet. We also reviewed medication options with an emphasis on risk versus benefit of those discussed.   Repetitive spaced learning was employed today to elicit superior memory formation and behavioral change.  5. Class 1 obesity with serious comorbidity and body mass index (BMI) of 33.0 to 33.9 in adult, unspecified obesity type Missael is currently in the action stage of change. As such, his goal is to continue with weight loss efforts. He has agreed to following a lower carbohydrate, vegetable and lean protein rich diet plan.   Verland will try the Low carbohydrate plan.  We discussed various medication options to help Shivam with his weight loss efforts and we both agreed to continue Saxenda at 1.8 mg and we will refill for 1 month.  - Liraglutide -Weight Management (SAXENDA) 18 MG/3ML SOPN; Inject 3 mg into the skin daily.  Dispense: 15 mL; Refill: 0  Exercise goals: As is.  Behavioral modification strategies: decreasing simple carbohydrates.  Magdaleno has agreed to follow-up with our clinic in 2 weeks.  Objective:   Blood pressure 111/76, pulse 89, temperature 97.9 F (36.6 C), height 5\' 8"  (1.727 m), weight 222 lb (100.7 kg), SpO2 96 %. Body mass index is 33.75 kg/m.  General: Cooperative, alert, well developed, in no acute distress. HEENT: Conjunctivae and lids  unremarkable. Cardiovascular: Regular rhythm.  Lungs: Normal work of breathing. Neurologic: No focal deficits.   Lab Results  Component Value Date   CREATININE 0.94 02/03/2020   BUN 14 02/03/2020   NA 140 02/03/2020   K 4.7 02/03/2020   CL 102 02/03/2020   CO2 25 02/03/2020   Lab Results  Component Value Date   ALT 41 02/03/2020   AST 33 02/03/2020   ALKPHOS 77 02/03/2020   BILITOT 0.5 02/03/2020   Lab Results  Component Value Date   HGBA1C 5.0 02/03/2020   HGBA1C 4.8 05/28/2019   Lab Results  Component Value Date   INSULIN 6.2 02/03/2020   INSULIN 19.0 05/28/2019   Lab Results  Component Value Date   TSH 0.346 (L) 02/03/2020   Lab Results  Component Value Date   CHOL 118 02/03/2020   HDL 46 02/03/2020   LDLCALC 53 02/03/2020   TRIG 105 02/03/2020   CHOLHDL 2.6 02/03/2020   Lab Results  Component Value Date   WBC 7.1 05/28/2019   HGB 15.6 05/28/2019   HCT 45.7 05/28/2019   MCV 88 05/28/2019   PLT 266 05/28/2019   No results found for: IRON, TIBC, FERRITIN  Attestation Statements:   Reviewed by clinician on day of visit: allergies, medications, problem list, medical history, surgical history, family history, social history, and previous encounter notes.   Wilhemena Durie, am acting as Location manager for [employer], FNP-C.  I have reviewed the above documentation for accuracy and completeness, and I agree with the above. -  Georgianne Fick, FNP

## 2020-03-04 ENCOUNTER — Encounter (INDEPENDENT_AMBULATORY_CARE_PROVIDER_SITE_OTHER): Payer: Self-pay | Admitting: Family Medicine

## 2020-03-04 DIAGNOSIS — E559 Vitamin D deficiency, unspecified: Secondary | ICD-10-CM | POA: Insufficient documentation

## 2020-03-04 DIAGNOSIS — E038 Other specified hypothyroidism: Secondary | ICD-10-CM | POA: Insufficient documentation

## 2020-03-20 ENCOUNTER — Other Ambulatory Visit (HOSPITAL_BASED_OUTPATIENT_CLINIC_OR_DEPARTMENT_OTHER): Payer: Self-pay | Admitting: Psychiatry

## 2020-03-20 DIAGNOSIS — F331 Major depressive disorder, recurrent, moderate: Secondary | ICD-10-CM | POA: Diagnosis not present

## 2020-03-20 DIAGNOSIS — F411 Generalized anxiety disorder: Secondary | ICD-10-CM | POA: Diagnosis not present

## 2020-03-20 DIAGNOSIS — F5101 Primary insomnia: Secondary | ICD-10-CM | POA: Diagnosis not present

## 2020-03-20 MED FILL — PHENELZINE SULFATE 15 MG TA: 15 | 90 days supply | Qty: 540 | Fill #0

## 2020-03-20 MED FILL — LITHIUM CARBONATE 300 MG CA: 300 | 90 days supply | Qty: 450 | Fill #0

## 2020-03-23 ENCOUNTER — Other Ambulatory Visit (INDEPENDENT_AMBULATORY_CARE_PROVIDER_SITE_OTHER): Payer: Self-pay | Admitting: Family Medicine

## 2020-03-23 ENCOUNTER — Ambulatory Visit (INDEPENDENT_AMBULATORY_CARE_PROVIDER_SITE_OTHER): Payer: 59 | Admitting: Family Medicine

## 2020-03-23 ENCOUNTER — Other Ambulatory Visit (HOSPITAL_BASED_OUTPATIENT_CLINIC_OR_DEPARTMENT_OTHER): Payer: Self-pay | Admitting: Endocrinology

## 2020-03-23 DIAGNOSIS — E669 Obesity, unspecified: Secondary | ICD-10-CM

## 2020-03-23 DIAGNOSIS — Z6833 Body mass index (BMI) 33.0-33.9, adult: Secondary | ICD-10-CM

## 2020-03-23 MED FILL — OXYBUTYNIN CHLORIDE 5 MG TA: 5 | 30 days supply | Qty: 60 | Fill #3

## 2020-03-23 NOTE — Telephone Encounter (Signed)
This patient was last seen by Lacie Draft, and currently has an upcoming appt scheduled on Abby Potash on 03/26/20

## 2020-03-26 ENCOUNTER — Other Ambulatory Visit (HOSPITAL_BASED_OUTPATIENT_CLINIC_OR_DEPARTMENT_OTHER): Payer: Self-pay | Admitting: Physician Assistant

## 2020-03-26 ENCOUNTER — Ambulatory Visit (INDEPENDENT_AMBULATORY_CARE_PROVIDER_SITE_OTHER): Payer: 59 | Admitting: Physician Assistant

## 2020-03-26 ENCOUNTER — Other Ambulatory Visit: Payer: Self-pay

## 2020-03-26 ENCOUNTER — Encounter (INDEPENDENT_AMBULATORY_CARE_PROVIDER_SITE_OTHER): Payer: Self-pay | Admitting: Physician Assistant

## 2020-03-26 VITALS — BP 106/74 | HR 73 | Temp 98.4°F | Ht 68.0 in | Wt 219.0 lb

## 2020-03-26 DIAGNOSIS — Z6833 Body mass index (BMI) 33.0-33.9, adult: Secondary | ICD-10-CM

## 2020-03-26 DIAGNOSIS — E669 Obesity, unspecified: Secondary | ICD-10-CM | POA: Diagnosis not present

## 2020-03-26 DIAGNOSIS — Z9189 Other specified personal risk factors, not elsewhere classified: Secondary | ICD-10-CM | POA: Diagnosis not present

## 2020-03-26 DIAGNOSIS — E8881 Metabolic syndrome: Secondary | ICD-10-CM

## 2020-03-26 DIAGNOSIS — I10 Essential (primary) hypertension: Secondary | ICD-10-CM | POA: Insufficient documentation

## 2020-03-26 DIAGNOSIS — E785 Hyperlipidemia, unspecified: Secondary | ICD-10-CM | POA: Diagnosis not present

## 2020-03-26 MED ORDER — SAXENDA 18 MG/3ML ~~LOC~~ SOPN: 3.0000 mg | PEN_INJECTOR | Freq: Every day | SUBCUTANEOUS | 0 refills | Status: DC

## 2020-03-27 MED FILL — LEVOTHYROXINE SODIUM 75 MCG: 75 | 30 days supply | Qty: 30 | Fill #0

## 2020-03-27 MED FILL — SAXENDA 18 MG/3 ML PEN: 18 | 30 days supply | Qty: 15 | Fill #0

## 2020-03-30 ENCOUNTER — Other Ambulatory Visit (HOSPITAL_BASED_OUTPATIENT_CLINIC_OR_DEPARTMENT_OTHER): Payer: Self-pay | Admitting: Physician Assistant

## 2020-03-30 MED ORDER — BD PEN NEEDLE NANO 2ND GEN 32G X 4 MM MISC: 1.0000 | Freq: Every day | 0 refills | Status: DC

## 2020-03-30 MED FILL — ULTICARE PEN NDL 4MM 32G: 32G X 4 MM | 90 days supply | Qty: 100 | Fill #0

## 2020-03-30 NOTE — Progress Notes (Signed)
Chief Complaint:   OBESITY Albert [name] is here to discuss his progress with his obesity treatment plan along with follow-up of his obesity related diagnoses. [name] is following Leaf[employer] Low Carb meal plan and states he is following his eating plan approximately 80% of the time. [name] states he is using weights/walking/Jiu-Jitsu 120 minutes 5 times per week.  Today's visit was #: 16 Starting weight: 236 lbs Starting date: 05/28/2019 Today's weight: 219 lbs Today's date: 03/26/2020 Total lbs lost to date: 17 Total lbs lost since last in-office visit: 3  Interim History: [name] states that he likes the low carb plan better than a category plan or journaling. He continues to eat yogurt and fruit and would like to stay on the modified low carb diet (eating fruit and yogurt) for another few weeks.  Subjective:   Insulin resistance. Beryl has a diagnosis of insulin resistance based on his elevated fasting insulin level >5. He continues to work on diet and exercise to decrease his risk of diabetes. Insulin level is much improved. He denies polyphagia. He is exercising regularly.  Lab Results  Component Value Date   INSULIN 6.2 02/03/2020   INSULIN 19.0 05/28/2019   Lab Results  Component Value Date   HGBA1C 5.0 02/03/2020   Hyperlipidemia, unspecified hyperlipidemia type. [name] has hyperlipidemia and has been trying to improve his cholesterol levels with intensive lifestyle modification including a low saturated fat diet, exercise and weight loss. He denies any chest pain, claudication or myalgias. [name] is on no medication. He is exercising regularly.  Lab Results  Component Value Date   ALT 41 02/03/2020   AST 33 02/03/2020   ALKPHOS 77 02/03/2020   BILITOT 0.5 02/03/2020   Lab Results  Component Value Date   CHOL 118 02/03/2020   HDL 46 02/03/2020   LDLCALC 53 02/03/2020   TRIG 105 02/03/2020   CHOLHDL 2.6 02/03/2020   At risk for heart disease. [name] is at a  higher than average risk for cardiovascular disease due to obesity.   Assessment/Plan:   Insulin resistance. [name] will continue to work on weight loss, exercise, and decreasing simple carbohydrates to help decrease the risk of diabetes. [name] agreed to follow-up with [name] as directed to closely monitor his progress. He will continue with low carb plan for now and continue exercise. Prescription was given for Insulin Pen Needle (BD PEN NEEDLE NANO 2ND GEN) 32G X 4 MM MISC #100.  Hyperlipidemia, unspecified hyperlipidemia type. Cardiovascular risk and specific lipid/LDL goals reviewed.  We discussed several lifestyle modifications today and [name] will continue to work on diet, exercise and weight loss efforts. Orders and follow up as documented in patient record. Will continue to monitor lipids.  Counseling Intensive lifestyle modifications are the first line treatment for this issue. . Dietary changes: Increase soluble fiber. Decrease simple carbohydrates. . Exercise changes: Moderate to vigorous-intensity aerobic activity 150 minutes per week if tolerated. . Lipid-lowering medications: see documented in medical record.  At risk for heart disease. [name] was given approximately 15 minutes of coronary artery disease prevention counseling today. He is 24 y.o. male and has risk factors for heart disease including obesity. We discussed intensive lifestyle modifications today with an emphasis on specific weight loss instructions and strategies.   Repetitive spaced learning was employed today to elicit superior memory formation and behavioral change.  Class 1 obesity with serious comorbidity and body mass index (BMI) of 33.0 to 33.9 in adult, unspecified obesity type. Refill was given  for Liraglutide - Weight Management (SAXENDA) 18 MG/3ML SOPN at increased dose of 2.4 mg #5 and needles #100.  [name] is currently in the action stage of change. As such, his goal is to continue with weight loss efforts.  He has agreed to following a lower carbohydrate, vegetable and lean protein rich diet plan (modified to include yogurt and fruit per patient's preference).   Exercise goals: For substantial health benefits, adults should do at least 150 minutes (2 hours and 30 minutes) a week of moderate-intensity, or 75 minutes (1 hour and 15 minutes) a week of vigorous-intensity aerobic physical activity, or an equivalent combination of moderate- and vigorous-intensity aerobic activity. Aerobic activity should be performed in episodes of at least 10 minutes, and preferably, it should be spread throughout the week.  Behavioral modification strategies: decreasing simple carbohydrates and meal planning and cooking strategies.  [name] has agreed to follow-up with our clinic in 3-4 weeks. He was informed of the importance of frequent follow-up visits to maximize his success with intensive lifestyle modifications for his multiple health conditions.   Objective:   Blood pressure 106/74, pulse 73, temperature 98.4 F (36.9 C), height 5\' 8"  (1.727 m), weight 219 lb (99.3 kg), SpO2 99 %. Body mass index is 33.3 kg/m.  General: Cooperative, alert, well developed, in no acute distress. HEENT: Conjunctivae and lids unremarkable. Cardiovascular: Regular rhythm.  Lungs: Normal work of breathing. Neurologic: No focal deficits.   Lab Results  Component Value Date   CREATININE 0.94 02/03/2020   BUN 14 02/03/2020   NA 140 02/03/2020   K 4.7 02/03/2020   CL 102 02/03/2020   CO2 25 02/03/2020   Lab Results  Component Value Date   ALT 41 02/03/2020   AST 33 02/03/2020   ALKPHOS 77 02/03/2020   BILITOT 0.5 02/03/2020   Lab Results  Component Value Date   HGBA1C 5.0 02/03/2020   HGBA1C 4.8 05/28/2019   Lab Results  Component Value Date   INSULIN 6.2 02/03/2020   INSULIN 19.0 05/28/2019   Lab Results  Component Value Date   TSH 0.346 (L) 02/03/2020   Lab Results  Component Value Date   CHOL 118  02/03/2020   HDL 46 02/03/2020   LDLCALC 53 02/03/2020   TRIG 105 02/03/2020   CHOLHDL 2.6 02/03/2020   Lab Results  Component Value Date   WBC 7.1 05/28/2019   HGB 15.6 05/28/2019   HCT 45.7 05/28/2019   MCV 88 05/28/2019   PLT 266 05/28/2019   No results found for: IRON, TIBC, FERRITIN  Attestation Statements:   Reviewed by clinician on day of visit: allergies, medications, problem list, medical history, surgical history, family history, social history, and previous encounter notes.  IMichaelene Song, am acting as transcriptionist for Abby Potash, PA-C   I have reviewed the above documentation for accuracy and completeness, and I agree with the above. Abby Potash, PA-C

## 2020-03-31 ENCOUNTER — Other Ambulatory Visit (HOSPITAL_BASED_OUTPATIENT_CLINIC_OR_DEPARTMENT_OTHER): Payer: Self-pay | Admitting: Family Medicine

## 2020-03-31 ENCOUNTER — Other Ambulatory Visit: Payer: Self-pay

## 2020-03-31 ENCOUNTER — Encounter: Payer: Self-pay | Admitting: Family Medicine

## 2020-03-31 ENCOUNTER — Telehealth (INDEPENDENT_AMBULATORY_CARE_PROVIDER_SITE_OTHER): Payer: 59 | Admitting: Family Medicine

## 2020-03-31 DIAGNOSIS — H9201 Otalgia, right ear: Secondary | ICD-10-CM | POA: Diagnosis not present

## 2020-03-31 DIAGNOSIS — H1033 Unspecified acute conjunctivitis, bilateral: Secondary | ICD-10-CM | POA: Diagnosis not present

## 2020-03-31 DIAGNOSIS — J040 Acute laryngitis: Secondary | ICD-10-CM

## 2020-03-31 MED ORDER — PREDNISONE 20 MG PO TABS: 40.0000 mg | ORAL_TABLET | Freq: Every day | ORAL | 0 refills | Status: AC

## 2020-03-31 MED ORDER — POLYMYXIN B-TRIMETHOPRIM 10000-0.1 UNIT/ML-% OP SOLN: 1.0000 [drp] | Freq: Four times a day (QID) | OPHTHALMIC | 0 refills | Status: DC

## 2020-03-31 MED FILL — predniSONE 20 MG TABS: 20 | 5 days supply | Qty: 10 | Fill #0

## 2020-03-31 MED FILL — POLYMYXIN B/TMP EYE DROPS: 10000-0.1 | 25 days supply | Qty: 10 | Fill #0

## 2020-03-31 NOTE — Progress Notes (Signed)
Chief Complaint  Patient presents with  . Nasal Congestion  . Cough  . Sore Throat  . Ear Pain  . Eye Drainage    Hoskins here for URI complaints. Due to COVID-19 pandemic, we are interacting via web portal for an electronic face-to-face visit. I verified patient's ID using 2 identifiers. Patient agreed to proceed with visit via this method. Patient is at home, I am at office. Patient and I are present for visit.   Duration: 5 days  Associated symptoms: sinus congestion, itchy watery eyes, ear pain, sore throat and cough Denies: sinus pain, rhinorrhea, ear drainage, wheezing, shortness of breath, myalgia and fevers Treatment to date: ibuprofen, Mucinex, Flonase, cough drops Sick contacts: No  Past Medical History:  Diagnosis Date  . Anxiety with depression 09/30/2016  . Bipolar disorder ([place])   . Constipation   . History of PSVT (paroxysmal supraventricular tachycardia)   . Hyperhidrosis   . IBS (irritable bowel syndrome)   . Joint pain   . Low back pain   . Major depression, melancholic type   . Schizophrenia ([place])   . Thyroid disorder   . Vitamin D deficiency    Exam No conversational dyspnea Age appropriate judgment and insight Nml affect and mood  Right ear pain - Plan: predniSONE (DELTASONE) 20 MG tablet  Acute conjunctivitis of both eyes, unspecified acute conjunctivitis type - Plan: trimethoprim-polymyxin b (POLYTRIM) ophthalmic solution  Laryngitis  5 d pred burst 40 mg/d. Amox if no better in 2-3 d. He will send message.  Polytrim qid b/l, don't touch face, artificial tears.  Continue to push fluids, practice good hand hygiene, cover mouth when coughing. F/u prn. If starting to experience fevers, shaking, or shortness of breath, seek immediate care. Pt voiced understanding and agreement to the plan.  [place], DO 03/31/20 1:00 PM

## 2020-04-02 ENCOUNTER — Other Ambulatory Visit: Payer: Self-pay | Admitting: Family Medicine

## 2020-04-02 ENCOUNTER — Other Ambulatory Visit (HOSPITAL_BASED_OUTPATIENT_CLINIC_OR_DEPARTMENT_OTHER): Payer: Self-pay | Admitting: Family Medicine

## 2020-04-02 MED ORDER — AMOXICILLIN 875 MG PO TABS: 875.0000 mg | ORAL_TABLET | Freq: Two times a day (BID) | ORAL | 0 refills | Status: AC

## 2020-04-02 MED FILL — AMOXICILLIN 875 MG TABS: 875 | 7 days supply | Qty: 14 | Fill #0

## 2020-04-10 DIAGNOSIS — E039 Hypothyroidism, unspecified: Secondary | ICD-10-CM | POA: Diagnosis not present

## 2020-04-22 ENCOUNTER — Ambulatory Visit (INDEPENDENT_AMBULATORY_CARE_PROVIDER_SITE_OTHER): Payer: 59 | Admitting: Physician Assistant

## 2020-05-04 MED FILL — LEVOTHYROXINE SODIUM 75 MCG: 75 | 30 days supply | Qty: 30 | Fill #1

## 2020-05-12 ENCOUNTER — Other Ambulatory Visit (INDEPENDENT_AMBULATORY_CARE_PROVIDER_SITE_OTHER): Payer: Self-pay | Admitting: Physician Assistant

## 2020-05-12 DIAGNOSIS — E669 Obesity, unspecified: Secondary | ICD-10-CM

## 2020-05-12 MED FILL — LINZESS 145 MCG CAPSULE: 145 | 30 days supply | Qty: 30 | Fill #0

## 2020-05-18 ENCOUNTER — Other Ambulatory Visit (HOSPITAL_BASED_OUTPATIENT_CLINIC_OR_DEPARTMENT_OTHER): Payer: Self-pay | Admitting: Endocrinology

## 2020-05-18 MED FILL — OXYBUTYNIN CHLORIDE 5 MG TA: 5 | 30 days supply | Qty: 60 | Fill #4

## 2020-05-18 MED FILL — LIOTHYRONINE SODIUM 5 MCG T: 5 | 90 days supply | Qty: 90 | Fill #0

## 2020-05-29 ENCOUNTER — Encounter (INDEPENDENT_AMBULATORY_CARE_PROVIDER_SITE_OTHER): Payer: Self-pay | Admitting: Physician Assistant

## 2020-06-05 ENCOUNTER — Other Ambulatory Visit (INDEPENDENT_AMBULATORY_CARE_PROVIDER_SITE_OTHER): Payer: Self-pay | Admitting: Physician Assistant

## 2020-06-05 DIAGNOSIS — E669 Obesity, unspecified: Secondary | ICD-10-CM

## 2020-06-05 MED FILL — LEVOTHYROXINE SODIUM 75 MCG: 75 | 30 days supply | Qty: 30 | Fill #2

## 2020-06-09 ENCOUNTER — Encounter (INDEPENDENT_AMBULATORY_CARE_PROVIDER_SITE_OTHER): Payer: Self-pay | Admitting: Physician Assistant

## 2020-06-10 ENCOUNTER — Other Ambulatory Visit: Payer: Self-pay | Admitting: Family Medicine

## 2020-06-10 ENCOUNTER — Other Ambulatory Visit (HOSPITAL_BASED_OUTPATIENT_CLINIC_OR_DEPARTMENT_OTHER): Payer: Self-pay | Admitting: Family Medicine

## 2020-06-10 DIAGNOSIS — K5909 Other constipation: Secondary | ICD-10-CM

## 2020-06-10 MED ORDER — LINACLOTIDE 290 MCG PO CAPS: 290.0000 ug | ORAL_CAPSULE | Freq: Every day | ORAL | 2 refills | Status: DC

## 2020-06-10 MED FILL — LINZESS 290 MCG CAPSULE: 290 | 90 days supply | Qty: 90 | Fill #0

## 2020-06-10 NOTE — Telephone Encounter (Signed)
Pt called today and appt has been scheduled

## 2020-06-19 ENCOUNTER — Other Ambulatory Visit (HOSPITAL_BASED_OUTPATIENT_CLINIC_OR_DEPARTMENT_OTHER): Payer: Self-pay | Admitting: Psychiatry

## 2020-06-19 DIAGNOSIS — F411 Generalized anxiety disorder: Secondary | ICD-10-CM | POA: Diagnosis not present

## 2020-06-19 MED FILL — PHENELZINE SULFATE 15 MG TA: 15 | 90 days supply | Qty: 540 | Fill #0

## 2020-06-19 MED FILL — LITHIUM CARBONATE 300 MG CA: 300 | 90 days supply | Qty: 450 | Fill #0

## 2020-06-24 ENCOUNTER — Ambulatory Visit (INDEPENDENT_AMBULATORY_CARE_PROVIDER_SITE_OTHER): Payer: 59 | Admitting: Physician Assistant

## 2020-06-29 ENCOUNTER — Ambulatory Visit (INDEPENDENT_AMBULATORY_CARE_PROVIDER_SITE_OTHER): Payer: 59 | Admitting: Physician Assistant

## 2020-06-29 ENCOUNTER — Encounter (INDEPENDENT_AMBULATORY_CARE_PROVIDER_SITE_OTHER): Payer: Self-pay | Admitting: Family Medicine

## 2020-06-29 ENCOUNTER — Other Ambulatory Visit (HOSPITAL_BASED_OUTPATIENT_CLINIC_OR_DEPARTMENT_OTHER): Payer: Self-pay | Admitting: Family Medicine

## 2020-06-29 ENCOUNTER — Ambulatory Visit (INDEPENDENT_AMBULATORY_CARE_PROVIDER_SITE_OTHER): Payer: 59 | Admitting: Family Medicine

## 2020-06-29 ENCOUNTER — Other Ambulatory Visit: Payer: Self-pay

## 2020-06-29 VITALS — BP 118/69 | HR 91 | Temp 98.6°F | Ht 68.0 in | Wt 219.0 lb

## 2020-06-29 DIAGNOSIS — Z6833 Body mass index (BMI) 33.0-33.9, adult: Secondary | ICD-10-CM

## 2020-06-29 DIAGNOSIS — E669 Obesity, unspecified: Secondary | ICD-10-CM | POA: Diagnosis not present

## 2020-06-29 DIAGNOSIS — E88819 Insulin resistance, unspecified: Secondary | ICD-10-CM

## 2020-06-29 DIAGNOSIS — E8881 Metabolic syndrome: Secondary | ICD-10-CM | POA: Diagnosis not present

## 2020-06-29 DIAGNOSIS — R632 Polyphagia: Secondary | ICD-10-CM

## 2020-06-29 DIAGNOSIS — Z9189 Other specified personal risk factors, not elsewhere classified: Secondary | ICD-10-CM | POA: Diagnosis not present

## 2020-06-29 MED ORDER — BD PEN NEEDLE NANO 2ND GEN 32G X 4 MM MISC: 1.0000 | Freq: Every day | 0 refills | Status: DC

## 2020-06-29 MED ORDER — SAXENDA 18 MG/3ML ~~LOC~~ SOPN: 3.0000 mg | PEN_INJECTOR | Freq: Every day | SUBCUTANEOUS | 0 refills | Status: DC

## 2020-06-29 MED FILL — LINZESS 290 MCG CAPSULE: 290 | 90 days supply | Qty: 90 | Fill #0

## 2020-06-29 MED FILL — ULTICARE PEN NDL 4MM 32G: 32G X 4 MM | 90 days supply | Qty: 100 | Fill #0

## 2020-06-30 ENCOUNTER — Encounter (INDEPENDENT_AMBULATORY_CARE_PROVIDER_SITE_OTHER): Payer: Self-pay | Admitting: Family Medicine

## 2020-06-30 DIAGNOSIS — R632 Polyphagia: Secondary | ICD-10-CM | POA: Insufficient documentation

## 2020-06-30 NOTE — Progress Notes (Signed)
Chief Complaint:   OBESITY Albert [name] is here to discuss his progress with his obesity treatment plan along with follow-up of his obesity related diagnoses. [name] is on following a lower carbohydrate, vegetable and lean protein rich diet plan and states he is following his eating plan approximately 60% of the time. [name] states he is doing weight lifting and jiu-jitsu for 75 minutes 4-5 times per week.  Today's visit was #: 78 Starting weight: 236 lbs Starting date: 05/28/2019 Today's weight: 219 lbs Today's date: 06/29/2020 Total lbs lost to date: 17 Total lbs lost since last in-office visit: 0  Interim History: [name] ran out of Saxenda and he has noticed an increase in appetite. He notes phenelzine caused significant weight gain but dose has been reduced recently. Tramane says he never had a weight problem until he began taking psychotropic medications. He has been off the plan over the holidays but he maintained his weight. He lacks access to a full kitchen Monday through Friday but has a mini fridge and a microwave. He eats out quite often. .  Subjective:   1. Polyphagia [name] notes increased hunger since being off [place] and also due to taking phenelzine.  2. Insulin resistance [name]'s last A1c was 5.0 and fasting insulin was 6.2 (down from 19.0). He continues to work on diet and exercise to decrease his risk of diabetes.  Lab Results  Component Value Date   INSULIN 6.2 02/03/2020   INSULIN 19.0 05/28/2019   Lab Results  Component Value Date   HGBA1C 5.0 02/03/2020   3. At risk for side effect of medication An is at risk for drug side effects due to restart of Saxenda.  Assessment/Plan:   1. Polyphagia . We will refill Saxenda for 1 month, and pen needles #100 with no refills. Advised him to titrate up the dose of Saxenda to avoid nausea. - Liraglutide -Weight Management (SAXENDA) 18 MG/3ML SOPN; Inject 3 mg into the skin daily.  Dispense: 15 mL; Refill: 0 -  Insulin Pen Needle (BD PEN NEEDLE NANO 2ND GEN) 32G X 4 MM MISC; 1 Device by Does not apply route daily.  Dispense: 100 each; Refill: 0  2. Insulin resistance [name] will continue Saxenda,  3. At risk for side effect of medication [name] was given approximately 15 minutes of drug side effect counseling today.  We discussed side effect possibility and risk versus benefits. [name] agreed to the medication and will contact this office if these side effects are intolerable.  Repetitive spaced learning was employed today to elicit superior memory formation and behavioral change.  4. Class 1 obesity with serious comorbidity and body mass index (BMI) of 33.0 to 33.9 in adult, unspecified obesity type [name] is currently in the action stage of change. As such, his goal is to continue with weight loss efforts. He has agreed to keeping a food journal and adhering to recommended goals of 1800-1900 calories and 110 grams of protein daily or following a lower carbohydrate, vegetable and lean protein rich diet plan.   - Liraglutide -Weight Management (SAXENDA) 18 MG/3ML SOPN; Inject 3 mg into the skin daily.  Dispense: 15 mL; Refill: 0 - Insulin Pen Needle (BD PEN NEEDLE NANO 2ND GEN) 32G X 4 MM MISC; 1 Device by Does not apply route daily.  Dispense: 100 each; Refill: 0  Exercise goals: As is.  Behavioral modification strategies: increasing lean protein intake and keeping a strict food journal.  [name] has agreed to follow-up with our clinic  in 2 to 3 weeks.   Objective:   Blood pressure 118/69, pulse 91, temperature 98.6 F (37 C), height 5\' 8"  (1.727 m), weight 219 lb (99.3 kg), SpO2 97 %. Body mass index is 33.3 kg/m.  General: Cooperative, alert, well developed, in no acute distress. HEENT: Conjunctivae and lids unremarkable. Cardiovascular: Regular rhythm.  Lungs: Normal work of breathing. Neurologic: No focal deficits.   Lab Results  Component Value Date   CREATININE 0.94 02/03/2020    BUN 14 02/03/2020   NA 140 02/03/2020   K 4.7 02/03/2020   CL 102 02/03/2020   CO2 25 02/03/2020   Lab Results  Component Value Date   ALT 41 02/03/2020   AST 33 02/03/2020   ALKPHOS 77 02/03/2020   BILITOT 0.5 02/03/2020   Lab Results  Component Value Date   HGBA1C 5.0 02/03/2020   HGBA1C 4.8 05/28/2019   Lab Results  Component Value Date   INSULIN 6.2 02/03/2020   INSULIN 19.0 05/28/2019   Lab Results  Component Value Date   TSH 0.346 (L) 02/03/2020   Lab Results  Component Value Date   CHOL 118 02/03/2020   HDL 46 02/03/2020   LDLCALC 53 02/03/2020   TRIG 105 02/03/2020   CHOLHDL 2.6 02/03/2020   Lab Results  Component Value Date   WBC 7.1 05/28/2019   HGB 15.6 05/28/2019   HCT 45.7 05/28/2019   MCV 88 05/28/2019   PLT 266 05/28/2019   No results found for: IRON, TIBC, FERRITIN  Attestation Statements:   Reviewed by clinician on day of visit: allergies, medications, problem list, medical history, surgical history, family history, social history, and previous encounter notes.   Wilhemena Durie, am acting as Location manager for [employer], FNP-C.  I have reviewed the above documentation for accuracy and completeness, and I agree with the above. -  Georgianne Fick, FNP

## 2020-07-03 MED FILL — SAXENDA 18 MG/3 ML PEN: 18 | 30 days supply | Qty: 15 | Fill #0

## 2020-07-07 ENCOUNTER — Encounter (INDEPENDENT_AMBULATORY_CARE_PROVIDER_SITE_OTHER): Payer: Self-pay

## 2020-07-08 MED FILL — OXYBUTYNIN CHLORIDE 5 MG TA: 5 | 30 days supply | Qty: 60 | Fill #5

## 2020-07-08 MED FILL — LEVOTHYROXINE SODIUM 75 MCG: 75 | 30 days supply | Qty: 30 | Fill #3

## 2020-07-14 ENCOUNTER — Ambulatory Visit (INDEPENDENT_AMBULATORY_CARE_PROVIDER_SITE_OTHER): Payer: 59 | Admitting: Physician Assistant

## 2020-07-27 ENCOUNTER — Ambulatory Visit (INDEPENDENT_AMBULATORY_CARE_PROVIDER_SITE_OTHER): Payer: 59 | Admitting: Family Medicine

## 2020-07-27 ENCOUNTER — Other Ambulatory Visit (HOSPITAL_BASED_OUTPATIENT_CLINIC_OR_DEPARTMENT_OTHER): Payer: Self-pay | Admitting: Family Medicine

## 2020-07-27 ENCOUNTER — Other Ambulatory Visit: Payer: Self-pay

## 2020-07-27 ENCOUNTER — Encounter (INDEPENDENT_AMBULATORY_CARE_PROVIDER_SITE_OTHER): Payer: Self-pay | Admitting: Family Medicine

## 2020-07-27 VITALS — BP 114/72 | HR 85 | Temp 98.1°F | Ht 68.0 in | Wt 218.0 lb

## 2020-07-27 DIAGNOSIS — R632 Polyphagia: Secondary | ICD-10-CM | POA: Diagnosis not present

## 2020-07-27 DIAGNOSIS — Z6833 Body mass index (BMI) 33.0-33.9, adult: Secondary | ICD-10-CM | POA: Diagnosis not present

## 2020-07-27 DIAGNOSIS — E669 Obesity, unspecified: Secondary | ICD-10-CM

## 2020-07-27 DIAGNOSIS — E559 Vitamin D deficiency, unspecified: Secondary | ICD-10-CM | POA: Diagnosis not present

## 2020-07-27 DIAGNOSIS — Z9189 Other specified personal risk factors, not elsewhere classified: Secondary | ICD-10-CM

## 2020-07-27 MED ORDER — SAXENDA 18 MG/3ML ~~LOC~~ SOPN: 3.0000 mg | PEN_INJECTOR | Freq: Every day | SUBCUTANEOUS | 0 refills | Status: DC

## 2020-07-27 MED FILL — SAXENDA 18 MG/3 ML PEN: 18 | 30 days supply | Qty: 15 | Fill #0

## 2020-07-28 ENCOUNTER — Encounter (INDEPENDENT_AMBULATORY_CARE_PROVIDER_SITE_OTHER): Payer: Self-pay | Admitting: Family Medicine

## 2020-07-28 NOTE — Progress Notes (Signed)
Chief Complaint:   OBESITY Albert [name] is here to discuss his progress with his obesity treatment plan along with follow-up of his obesity related diagnoses. Albert [name] is on keeping a food journal and adhering to recommended goals of 1800-1900 calories and 110 g protein and states he is following his eating plan approximately 60% of the time. Albert [name] states he is doing cardio, weights, and martial arts 60-90 minutes 5 times per week.  Today's visit was #: 18 Starting weight: 236 lbs Starting date: 05/28/2019 Today's weight: 218 lbs Today's date: 07/27/2020 Total lbs lost to date: 18 lbs Total lbs lost since last in-office visit: 1 lb  Interim History: Albert [name] is averaging 1900 calories day and meeting protein goals. He is on Saxenda 2.4 mg. He does note some hunger. He has cut out sweets for the most part. His weight goal is 190 lbs. He notes he eats too many carbs, like fries and bread. He eats out quite. He recently took the MCAT and scored in the 87th percentile. He hopes to be accepted to med school [employer] or [employer]) and start fall of 2023.  Subjective:   1. Polyphagia Albert [name] is on Saxenda 2.4 mg. He notes some hunger. He denies nausea or constipation. Phenelzine dose decreased to 60 mg recently -he is hopeful this will decrease hunger.  2. Vitamin D deficiency Albert [name]'s Vitamin D level was 65.6 on 02/03/2020. He is currently taking OTC vitamin D 5,000 IU each day.    Ref. Range 02/03/2020 10:09  Vitamin D, 25-Hydroxy Latest Ref Range: 30.0 - 100.0 ng/mL 65.6   3. At risk for side effect of medication Albert [name] is at risk for side effects of medication due to increased dose of Saxenda.  Assessment/Plan:   1. Polyphagia Intensive lifestyle modifications are the first line treatment for this issue. We discussed several lifestyle modifications today and he will continue to work on diet, exercise and weight loss efforts.  Increase Saxenda to 3.0 mg daily.  - Liraglutide -Weight Management (SAXENDA)  18 MG/3ML SOPN; Inject 3 mg into the skin daily.  Dispense: 15 mL; Refill: 0  2. Vitamin D deficiency Low Vitamin D level contributes to fatigue and are associated with obesity, breast, and colon cancer. He agrees to continue to take OTC Vitamin D @5 ,000 IU daily and will follow-up for routine testing of Vitamin D, at least 2-3 times per year to avoid over-replacement.  3. At risk for side effect of medication Albert [name] was given approximately 15 minutes of drug side effect counseling today.  We discussed side effect possibility and risk versus benefits. Albert [name] agreed to the medication and will contact this office if these side effects are intolerable.  Repetitive spaced learning was employed today to elicit superior memory formation and behavioral change.  4. Class 1 obesity with serious comorbidity and body mass index (BMI) of 33.0 to 33.9 in adult, unspecified obesity type Albert [name] is currently in the action stage of change. As such, his goal is to continue with weight loss efforts. He has agreed to keeping a food journal and adhering to recommended goals of 1800-1900 calories and 110 g protein.   We discussed various medication options to help Albert [name] with his weight loss efforts and we both agreed to increase Saxenda to 3.0 mg daily. - Liraglutide -Weight Management (SAXENDA) 18 MG/3ML SOPN; Inject 3 mg into the skin daily.  Dispense: 15 mL; Refill: 0  Exercise goals: As is  Behavioral modification strategies: decreasing eating out and meal planning and  cooking strategies.  Albert [name] has agreed to follow-up with our clinic in 3 weeks.  Objective:   Blood pressure 114/72, pulse 85, temperature 98.1 F (36.7 C), height 5\' 8"  (1.727 m), weight 218 lb (98.9 kg), SpO2 97 %. Body mass index is 33.15 kg/m.  General: Cooperative, alert, well developed, in no acute distress. HEENT: Conjunctivae and lids unremarkable. Cardiovascular: Regular rhythm.  Lungs: Normal work of breathing. Neurologic: No  focal deficits.   Lab Results  Component Value Date   CREATININE 0.94 02/03/2020   BUN 14 02/03/2020   NA 140 02/03/2020   K 4.7 02/03/2020   CL 102 02/03/2020   CO2 25 02/03/2020   Lab Results  Component Value Date   ALT 41 02/03/2020   AST 33 02/03/2020   ALKPHOS 77 02/03/2020   BILITOT 0.5 02/03/2020   Lab Results  Component Value Date   HGBA1C 5.0 02/03/2020   HGBA1C 4.8 05/28/2019   Lab Results  Component Value Date   INSULIN 6.2 02/03/2020   INSULIN 19.0 05/28/2019   Lab Results  Component Value Date   TSH 0.346 (L) 02/03/2020   Lab Results  Component Value Date   CHOL 118 02/03/2020   HDL 46 02/03/2020   LDLCALC 53 02/03/2020   TRIG 105 02/03/2020   CHOLHDL 2.6 02/03/2020   Lab Results  Component Value Date   WBC 7.1 05/28/2019   HGB 15.6 05/28/2019   HCT 45.7 05/28/2019   MCV 88 05/28/2019   PLT 266 05/28/2019    Attestation Statements:   Reviewed by clinician on day of visit: allergies, medications, problem list, medical history, surgical history, family history, social history, and previous encounter notes.  Coral Ceo, am acting as Location manager for [employer], [place].  I have reviewed the above documentation for accuracy and completeness, and I agree with the above. -  Georgianne Fick, FNP

## 2020-08-05 DIAGNOSIS — E039 Hypothyroidism, unspecified: Secondary | ICD-10-CM | POA: Diagnosis not present

## 2020-08-05 DIAGNOSIS — E291 Testicular hypofunction: Secondary | ICD-10-CM | POA: Diagnosis not present

## 2020-08-11 MED FILL — LEVOTHYROXINE SODIUM 75 MCG: 75 | 30 days supply | Qty: 30 | Fill #4

## 2020-08-19 ENCOUNTER — Encounter (INDEPENDENT_AMBULATORY_CARE_PROVIDER_SITE_OTHER): Payer: Self-pay | Admitting: Family Medicine

## 2020-08-19 ENCOUNTER — Other Ambulatory Visit (HOSPITAL_BASED_OUTPATIENT_CLINIC_OR_DEPARTMENT_OTHER): Payer: Self-pay | Admitting: Family Medicine

## 2020-08-19 ENCOUNTER — Ambulatory Visit (INDEPENDENT_AMBULATORY_CARE_PROVIDER_SITE_OTHER): Payer: 59 | Admitting: Family Medicine

## 2020-08-19 ENCOUNTER — Other Ambulatory Visit: Payer: Self-pay

## 2020-08-19 VITALS — BP 113/68 | HR 88 | Temp 97.8°F | Ht 68.0 in | Wt 217.0 lb

## 2020-08-19 DIAGNOSIS — R632 Polyphagia: Secondary | ICD-10-CM | POA: Diagnosis not present

## 2020-08-19 DIAGNOSIS — E559 Vitamin D deficiency, unspecified: Secondary | ICD-10-CM

## 2020-08-19 DIAGNOSIS — Z6835 Body mass index (BMI) 35.0-35.9, adult: Secondary | ICD-10-CM

## 2020-08-19 MED ORDER — SAXENDA 18 MG/3ML ~~LOC~~ SOPN: 3.0000 mg | PEN_INJECTOR | Freq: Every day | SUBCUTANEOUS | 0 refills | Status: DC

## 2020-08-19 NOTE — Addendum Note (Signed)
Addended by: Georgianne Fick on: 08/19/2020 10:50 AM   Modules accepted: Level of Service

## 2020-08-20 MED FILL — SAXENDA 18 MG/3 ML PEN: 18 | 30 days supply | Qty: 15 | Fill #0

## 2020-08-21 MED FILL — LIOTHYRONINE SODIUM 5 MCG T: 5 | 90 days supply | Qty: 90 | Fill #1

## 2020-08-25 ENCOUNTER — Encounter (INDEPENDENT_AMBULATORY_CARE_PROVIDER_SITE_OTHER): Payer: Self-pay | Admitting: Family Medicine

## 2020-08-25 NOTE — Progress Notes (Signed)
Chief Complaint:   OBESITY Albert [name] is here to discuss his progress with his obesity treatment plan along with follow-up of his obesity related diagnoses. Albert [name] is on keeping a food journal and adhering to recommended goals of 1800-1900 calories and 110 grams of protein and states he is following his eating plan approximately 50% of the time. Albert [name] states he is lifting weights and doing Jujitsu for 90 minutes 4 times per week.  Today's visit was #: 46 Starting weight: 236 lbs Starting date: 05/28/2019 Today's weight: 217 lbs Today's date: 08/19/2020 Total lbs lost to date: 19 lbs Total lbs lost since last in-office visit: +1  Interim History: Albert [name]'s Saxenda dose was increased to 3.0 mg daily at last office visit.  Tolerating increased dose well.  Appetite suppression improved.  Notes some constipation.  Nardil dose was recently increased to 75 mg which increased hunger. Psychotropic meds have been responsible for Albert [name]'s weight struggle and this is always a delicate balance for him (keeping mood stable and minimizing weight gain).  On Linzess for constipation.  He is journaling sporadically.  He has reduced fried foods.   He will be getting a place with a kitchen in 1-2 months which will facilitate eating in a more healthy manner.  Subjective:   1. Polyphagia Improved with 3 mg dose of Saxenda.  2. Vitamin D deficiency At goal.  On 10,000 IU daily vitamin D3.  Assessment/Plan:   1. Polyphagia Will refill Saxenda 3.0 mg daily, as per below.  - Refill Liraglutide -Weight Management (SAXENDA) 18 MG/3ML SOPN; Inject 3 mg into the skin daily.  Dispense: 15 mL; Refill: 0  2. Vitamin D deficiency Continue 10,000 IU vitamin D daily.  3. Obesity: BMI 33.1  Albert [name] is currently in the action stage of change. As such, his goal is to continue with weight loss efforts. He has agreed to keeping a food journal and adhering to recommended goals of 1800-1900 calories and 110 grams  of protein.   Encouraged him to pack food for work.  Exercise goals: As is.  Behavioral modification strategies: decreasing simple carbohydrates and decreasing eating out.  Albert [name] has agreed to follow-up with our clinic in 3-4 weeks.  Objective:   Blood pressure 113/68, pulse 88, temperature 97.8 F (36.6 C), height 5\' 8"  (1.727 m), weight 217 lb (98.4 kg), SpO2 98 %. Body mass index is 32.99 kg/m.  General: Cooperative, alert, well developed, in no acute distress. HEENT: Conjunctivae and lids unremarkable. Cardiovascular: Regular rhythm.  Lungs: Normal work of breathing. Neurologic: No focal deficits.   Recent Labs       Lab Results  Component Value Date   CREATININE 0.94 02/03/2020   BUN 14 02/03/2020   NA 140 02/03/2020   K 4.7 02/03/2020   CL 102 02/03/2020   CO2 25 02/03/2020     Recent Labs       Lab Results  Component Value Date   ALT 41 02/03/2020   AST 33 02/03/2020   ALKPHOS 77 02/03/2020   BILITOT 0.5 02/03/2020     Recent Labs       Lab Results  Component Value Date   HGBA1C 5.0 02/03/2020   HGBA1C 4.8 05/28/2019     Recent Labs       Lab Results  Component Value Date   INSULIN 6.2 02/03/2020   INSULIN 19.0 05/28/2019     Recent Labs       Lab Results  Component Value Date   TSH 0.346 (L)  02/03/2020     Recent Labs       Lab Results  Component Value Date   CHOL 118 02/03/2020   HDL 46 02/03/2020   LDLCALC 53 02/03/2020   TRIG 105 02/03/2020   CHOLHDL 2.6 02/03/2020     Recent Labs       Lab Results  Component Value Date   WBC 7.1 05/28/2019   HGB 15.6 05/28/2019   HCT 45.7 05/28/2019   MCV 88 05/28/2019   PLT 266 05/28/2019     Attestation Statements:   Reviewed by clinician on day of visit: allergies, medications, problem list, medical history, surgical history, family history, social history, and previous encounter notes.  I, Water quality scientist, CMA, am acting as  Location manager for [employer], [place].  I have reviewed the above documentation for accuracy and completeness, and I agree with the above. -  Georgianne Fick, FNP

## 2020-08-25 NOTE — Progress Notes (Addendum)
Chief Complaint:   OBESITY Albert [name] is here to discuss his progress with his obesity treatment plan along with follow-up of his obesity related diagnoses. Albert [name] is on keeping a food journal and adhering to recommended goals of 1800-1900 calories and 110 grams of protein and states he is following his eating plan approximately 50% of the time. Albert [name] states he is lifting weights and doing Jujitsu for 90 minutes 4 times per week.  Today's visit was #: 72 Starting weight: 236 lbs Starting date: 05/28/2019 Today's weight: 217 lbs Today's date: 08/19/2020 Total lbs lost to date: 19 lbs Total lbs lost since last in-office visit: +1  Interim History: Albert [name]'s Saxenda dose was increased to 3.0 mg daily at last office visit.  Tolerating increased dose well.  Appetite suppression improved.  Notes some constipation.  Albert [name] dose was recently increased to 75 mg which increased hunger. Psychotropic meds have been responsible for Albert [name] weight struggle and this is always a delicate balance for him (keeping mood stable and minimizing weight gain).  On Linzess for constipation.  He is journaling sporadically.  He has reduced fried foods.   He will be getting a place with a kitchen in 1-2 months which will facilitate eating in a more healthy manner.  Subjective:   1. Polyphagia Improved with 3 mg dose of Saxenda.  2. Vitamin D deficiency At goal.  On 10,000 IU daily vitamin D3.  Assessment/Plan:   1. Polyphagia Will refill Saxenda 3.0 mg daily, as per below.  - Refill Liraglutide -Weight Management (SAXENDA) 18 MG/3ML SOPN; Inject 3 mg into the skin daily.  Dispense: 15 mL; Refill: 0  2. Vitamin D deficiency Continue 10,000 IU vitamin D daily.  3. Obesity: BMI 33.1  Albert [name] is currently in the action stage of change. As such, his goal is to continue with weight loss efforts. He has agreed to keeping a food journal and adhering to recommended goals of 1800-1900 calories and 110 grams of  protein.   Encouraged him to pack food for work.  Exercise goals: As is.  Behavioral modification strategies: decreasing simple carbohydrates and decreasing eating out.  Albert [name] has agreed to follow-up with our clinic in 3-4 weeks.  Objective:   Blood pressure 113/68, pulse 88, temperature 97.8 F (36.6 C), height 5\' 8"  (1.727 m), weight 217 lb (98.4 kg), SpO2 98 %. Body mass index is 32.99 kg/m.  General: Cooperative, alert, well developed, in no acute distress. HEENT: Conjunctivae and lids unremarkable. Cardiovascular: Regular rhythm.  Lungs: Normal work of breathing. Neurologic: No focal deficits.   Lab Results  Component Value Date   CREATININE 0.94 02/03/2020   BUN 14 02/03/2020   NA 140 02/03/2020   K 4.7 02/03/2020   CL 102 02/03/2020   CO2 25 02/03/2020   Lab Results  Component Value Date   ALT 41 02/03/2020   AST 33 02/03/2020   ALKPHOS 77 02/03/2020   BILITOT 0.5 02/03/2020   Lab Results  Component Value Date   HGBA1C 5.0 02/03/2020   HGBA1C 4.8 05/28/2019   Lab Results  Component Value Date   INSULIN 6.2 02/03/2020   INSULIN 19.0 05/28/2019   Lab Results  Component Value Date   TSH 0.346 (L) 02/03/2020   Lab Results  Component Value Date   CHOL 118 02/03/2020   HDL 46 02/03/2020   LDLCALC 53 02/03/2020   TRIG 105 02/03/2020   CHOLHDL 2.6 02/03/2020   Lab Results  Component Value Date   WBC 7.1  05/28/2019   HGB 15.6 05/28/2019   HCT 45.7 05/28/2019   MCV 88 05/28/2019   PLT 266 05/28/2019   Attestation Statements:   Reviewed by clinician on day of visit: allergies, medications, problem list, medical history, surgical history, family history, social history, and previous encounter notes.  I, Water quality scientist, CMA, am acting as Location manager for [employer], [place].  I have reviewed the above documentation for accuracy and completeness, and I agree with the above. -  Georgianne Fick, FNP

## 2020-09-08 ENCOUNTER — Other Ambulatory Visit (HOSPITAL_BASED_OUTPATIENT_CLINIC_OR_DEPARTMENT_OTHER): Payer: Self-pay

## 2020-09-08 ENCOUNTER — Other Ambulatory Visit: Payer: Self-pay | Admitting: Family Medicine

## 2020-09-08 MED ORDER — LINACLOTIDE 290 MCG PO CAPS
ORAL_CAPSULE | ORAL | 2 refills | Status: DC
  Filled 2020-09-08 – 2020-09-11 (×2): qty 30, 30d supply, fill #0
  Filled 2020-09-11: qty 90, 90d supply, fill #0

## 2020-09-09 ENCOUNTER — Ambulatory Visit (INDEPENDENT_AMBULATORY_CARE_PROVIDER_SITE_OTHER): Payer: 59 | Admitting: Family Medicine

## 2020-09-11 ENCOUNTER — Other Ambulatory Visit (HOSPITAL_BASED_OUTPATIENT_CLINIC_OR_DEPARTMENT_OTHER): Payer: Self-pay

## 2020-09-16 ENCOUNTER — Other Ambulatory Visit (HOSPITAL_BASED_OUTPATIENT_CLINIC_OR_DEPARTMENT_OTHER): Payer: Self-pay

## 2020-09-16 MED FILL — Levothyroxine Sodium Tab 75 MCG: ORAL | 30 days supply | Qty: 30 | Fill #0 | Status: AC

## 2020-09-18 ENCOUNTER — Other Ambulatory Visit (HOSPITAL_BASED_OUTPATIENT_CLINIC_OR_DEPARTMENT_OTHER): Payer: Self-pay

## 2020-09-18 DIAGNOSIS — F411 Generalized anxiety disorder: Secondary | ICD-10-CM | POA: Diagnosis not present

## 2020-09-18 DIAGNOSIS — R5382 Chronic fatigue, unspecified: Secondary | ICD-10-CM | POA: Diagnosis not present

## 2020-09-18 DIAGNOSIS — F3341 Major depressive disorder, recurrent, in partial remission: Secondary | ICD-10-CM | POA: Diagnosis not present

## 2020-09-18 MED ORDER — PHENELZINE SULFATE 15 MG PO TABS
ORAL_TABLET | ORAL | 0 refills | Status: DC
  Filled 2020-09-18: qty 540, 90d supply, fill #0

## 2020-09-18 MED ORDER — LITHIUM CARBONATE 300 MG PO CAPS
ORAL_CAPSULE | ORAL | 0 refills | Status: DC
  Filled 2020-09-18: qty 450, 90d supply, fill #0

## 2020-09-29 ENCOUNTER — Encounter (INDEPENDENT_AMBULATORY_CARE_PROVIDER_SITE_OTHER): Payer: Self-pay | Admitting: Family Medicine

## 2020-09-29 ENCOUNTER — Telehealth (INDEPENDENT_AMBULATORY_CARE_PROVIDER_SITE_OTHER): Payer: 59 | Admitting: Family Medicine

## 2020-09-29 ENCOUNTER — Other Ambulatory Visit: Payer: Self-pay

## 2020-09-29 ENCOUNTER — Other Ambulatory Visit (HOSPITAL_BASED_OUTPATIENT_CLINIC_OR_DEPARTMENT_OTHER): Payer: Self-pay

## 2020-09-29 DIAGNOSIS — F339 Major depressive disorder, recurrent, unspecified: Secondary | ICD-10-CM

## 2020-09-29 DIAGNOSIS — E8881 Metabolic syndrome: Secondary | ICD-10-CM | POA: Diagnosis not present

## 2020-09-29 DIAGNOSIS — Z6835 Body mass index (BMI) 35.0-35.9, adult: Secondary | ICD-10-CM

## 2020-09-29 DIAGNOSIS — E88819 Insulin resistance, unspecified: Secondary | ICD-10-CM

## 2020-09-29 MED ORDER — LIRAGLUTIDE -WEIGHT MANAGEMENT 18 MG/3ML ~~LOC~~ SOPN
3.0000 mg | PEN_INJECTOR | Freq: Every day | SUBCUTANEOUS | 0 refills | Status: DC
  Filled 2020-09-29: qty 15, 30d supply, fill #0

## 2020-09-29 MED FILL — Oxybutynin Chloride Tab 5 MG: ORAL | 30 days supply | Qty: 60 | Fill #0 | Status: AC

## 2020-09-30 NOTE — Progress Notes (Signed)
TeleHealth Visit:  Due to the COVID-19 pandemic, this visit was completed with telemedicine (audio/video) technology to reduce patient and provider exposure as well as to preserve personal protective equipment.   Albert [name] has verbally consented to this TeleHealth visit. The patient is located at home, the provider is located at the [facility] office. The participants in this visit include the listed provider and patient. The visit was conducted today via MyChart video.   Chief Complaint: OBESITY Albert [name] is here to discuss his progress with his obesity treatment plan along with follow-up of his obesity related diagnoses. Albert [name] is on keeping a food journal and adhering to recommended goals of 1800-1900 calories and 110 grams of protein daily and states he is following his eating plan approximately 60% of the time. [name] states he is doing cardio and weights for 60 minutes 5 times per week.  Today's visit was #: 20 Starting weight: 236 lbs Starting date: 05/28/2019  Interim History: Albert [name] feels he has maintained his weight. He weighed 217.4 lbs today at home. He weighed 217 lbs at his last office appointment on 08/19/2020. He says he has cut down on fries and pizza. His protein intake is good, and he feels he is eating in a healthy manner 60% of the time. His goal is <15% body fat. He eats out 2 times per day. He will be moving to a place with a kitchen in June, so he will be able to cook more.   Subjective:   1. Insulin resistance Albert [name] is on Saxenda 3.0 mg and he notes constipation which is managed with Linzess and docusate.  Lab Results  Component Value Date   INSULIN 6.2 02/03/2020   INSULIN 19.0 05/28/2019   Lab Results  Component Value Date   HGBA1C 5.0 02/03/2020   2. Recurrent major depressive disorder, remission status unspecified ([place]) Albert [name] forgot to take lithium for a few days and felt ok so he decided to stop taking it. He has has been off lithium for 5  days and he feels his mood has been stable. He sees a Social worker weekly or bi-weekly. And his care his managed by a psychiatrist.  He notes he has been classified as having bipolar II or severe depression with episodes of psychosis.   Assessment/Plan:   1. Insulin resistance . We will refill Saxenda for 1 month. Albert [name] agreed to follow-up with [name] as directed to closely monitor his progress.  - Liraglutide -Weight Management 18 MG/3ML SOPN; INJECT 3 MG INTO THE SKIN DAILY.  Dispense: 15 mL; Refill: 0  2. Recurrent major depressive disorder, remission status unspecified ([place]) Albert [name] was encouraged to call Psychiatry to inform about his lithium.  3. Obesity: BMI 33.1 [name] is currently in the action stage of change. As such, his goal is to continue with weight loss efforts. He has agreed to keeping a food journal and adhering to recommended goals of 1800-1900 calories and 110 grams of protein daily.   Exercise goals: As is.  Behavioral modification strategies: decreasing eating out.  Albert [name] has agreed to follow-up with our clinic in 3 weeks.  Objective:   VITALS: Per patient if applicable, see vitals. GENERAL: Alert and in no acute distress. CARDIOPULMONARY: No increased WOB. Speaking in clear sentences.  PSYCH: Pleasant and cooperative. Speech normal rate and rhythm. Affect is appropriate. Insight and judgement are appropriate. Attention is focused, linear, and appropriate.  NEURO: Oriented as arrived to appointment on time with no prompting.   Lab Results  Component Value Date   CREATININE 0.94 02/03/2020   BUN 14 02/03/2020   NA 140 02/03/2020   K 4.7 02/03/2020   CL 102 02/03/2020   CO2 25 02/03/2020   Lab Results  Component Value Date   ALT 41 02/03/2020   AST 33 02/03/2020   ALKPHOS 77 02/03/2020   BILITOT 0.5 02/03/2020   Lab Results  Component Value Date   HGBA1C 5.0 02/03/2020   HGBA1C 4.8 05/28/2019   Lab Results  Component Value Date   INSULIN 6.2  02/03/2020   INSULIN 19.0 05/28/2019   Lab Results  Component Value Date   TSH 0.346 (L) 02/03/2020   Lab Results  Component Value Date   CHOL 118 02/03/2020   HDL 46 02/03/2020   LDLCALC 53 02/03/2020   TRIG 105 02/03/2020   CHOLHDL 2.6 02/03/2020   Lab Results  Component Value Date   WBC 7.1 05/28/2019   HGB 15.6 05/28/2019   HCT 45.7 05/28/2019   MCV 88 05/28/2019   PLT 266 05/28/2019   No results found for: IRON, TIBC, FERRITIN  Attestation Statements:   Reviewed by clinician on day of visit: allergies, medications, problem list, medical history, surgical history, family history, social history, and previous encounter notes.   Wilhemena Durie, am acting as Location manager for [employer], FNP-C.  I have reviewed the above documentation for accuracy and completeness, and I agree with the above. - Georgianne Fick, FNP

## 2020-10-01 ENCOUNTER — Encounter (INDEPENDENT_AMBULATORY_CARE_PROVIDER_SITE_OTHER): Payer: Self-pay | Admitting: Family Medicine

## 2020-10-13 ENCOUNTER — Other Ambulatory Visit (HOSPITAL_BASED_OUTPATIENT_CLINIC_OR_DEPARTMENT_OTHER): Payer: Self-pay

## 2020-10-13 DIAGNOSIS — E010 Iodine-deficiency related diffuse (endemic) goiter: Secondary | ICD-10-CM | POA: Diagnosis not present

## 2020-10-13 DIAGNOSIS — E039 Hypothyroidism, unspecified: Secondary | ICD-10-CM | POA: Diagnosis not present

## 2020-10-13 DIAGNOSIS — E291 Testicular hypofunction: Secondary | ICD-10-CM | POA: Diagnosis not present

## 2020-10-13 MED ORDER — LIOTHYRONINE SODIUM 5 MCG PO TABS
ORAL_TABLET | ORAL | 6 refills | Status: DC
  Filled 2020-10-13 – 2020-10-21 (×2): qty 60, 30d supply, fill #0

## 2020-10-13 MED ORDER — LEVOTHYROXINE SODIUM 50 MCG PO TABS
ORAL_TABLET | ORAL | 6 refills | Status: DC
  Filled 2020-10-13 – 2020-10-22 (×2): qty 30, 30d supply, fill #0
  Filled 2020-11-20: qty 30, 30d supply, fill #1

## 2020-10-15 IMAGING — DX DG LUMBAR SPINE 2-3V
3 series · 3 of 3 positions shown · non-contrast
Comparison: None.

CLINICAL DATA: Low back pain

EXAM:
LUMBAR SPINE - 2-3 VIEW

[l-spine ap]
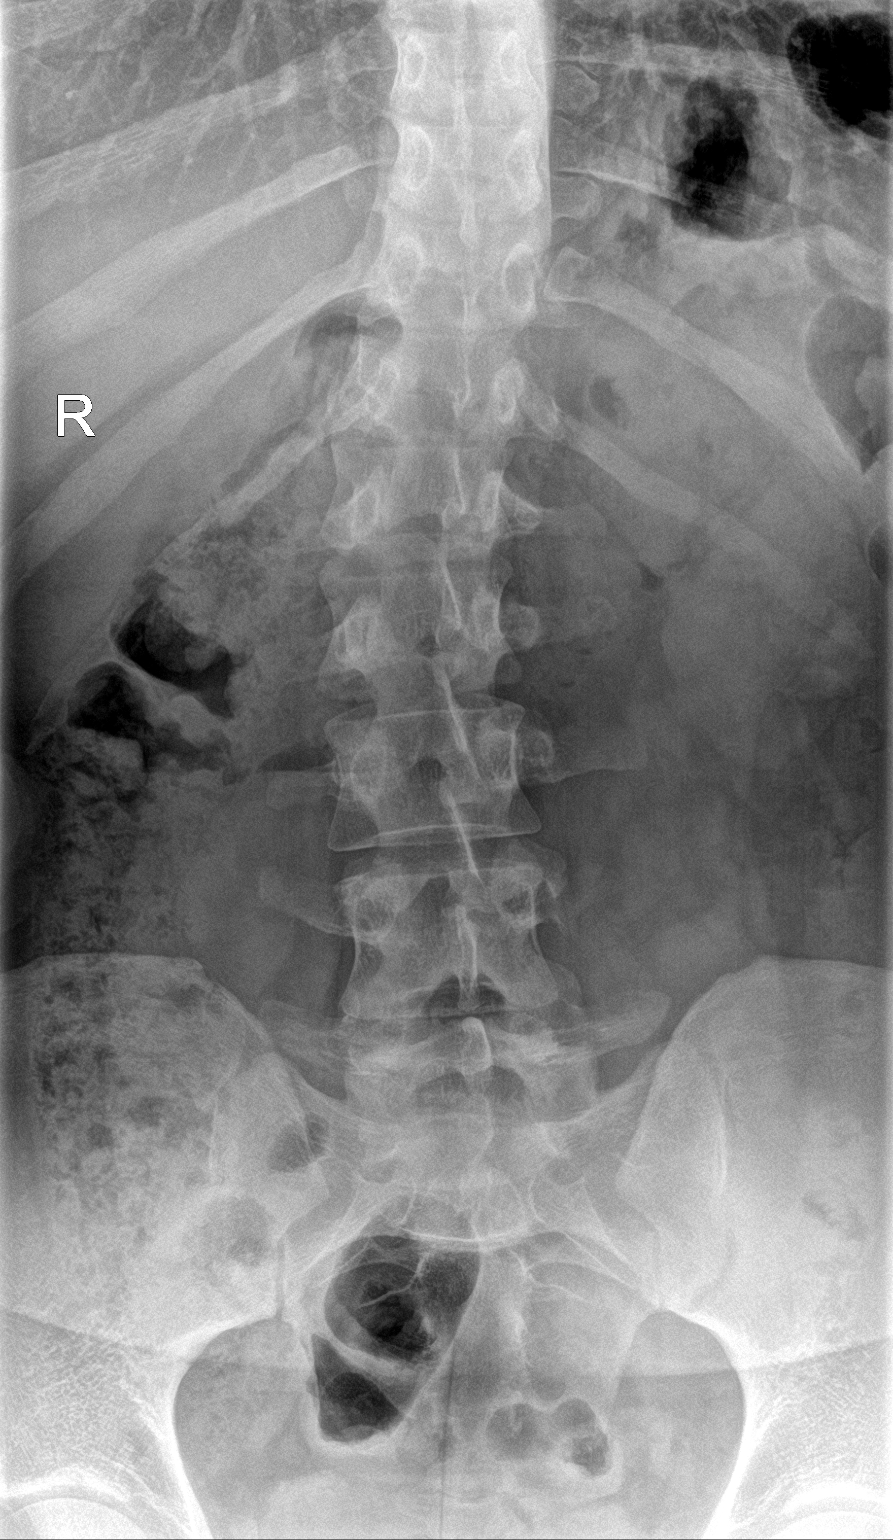

[l-spine lat]
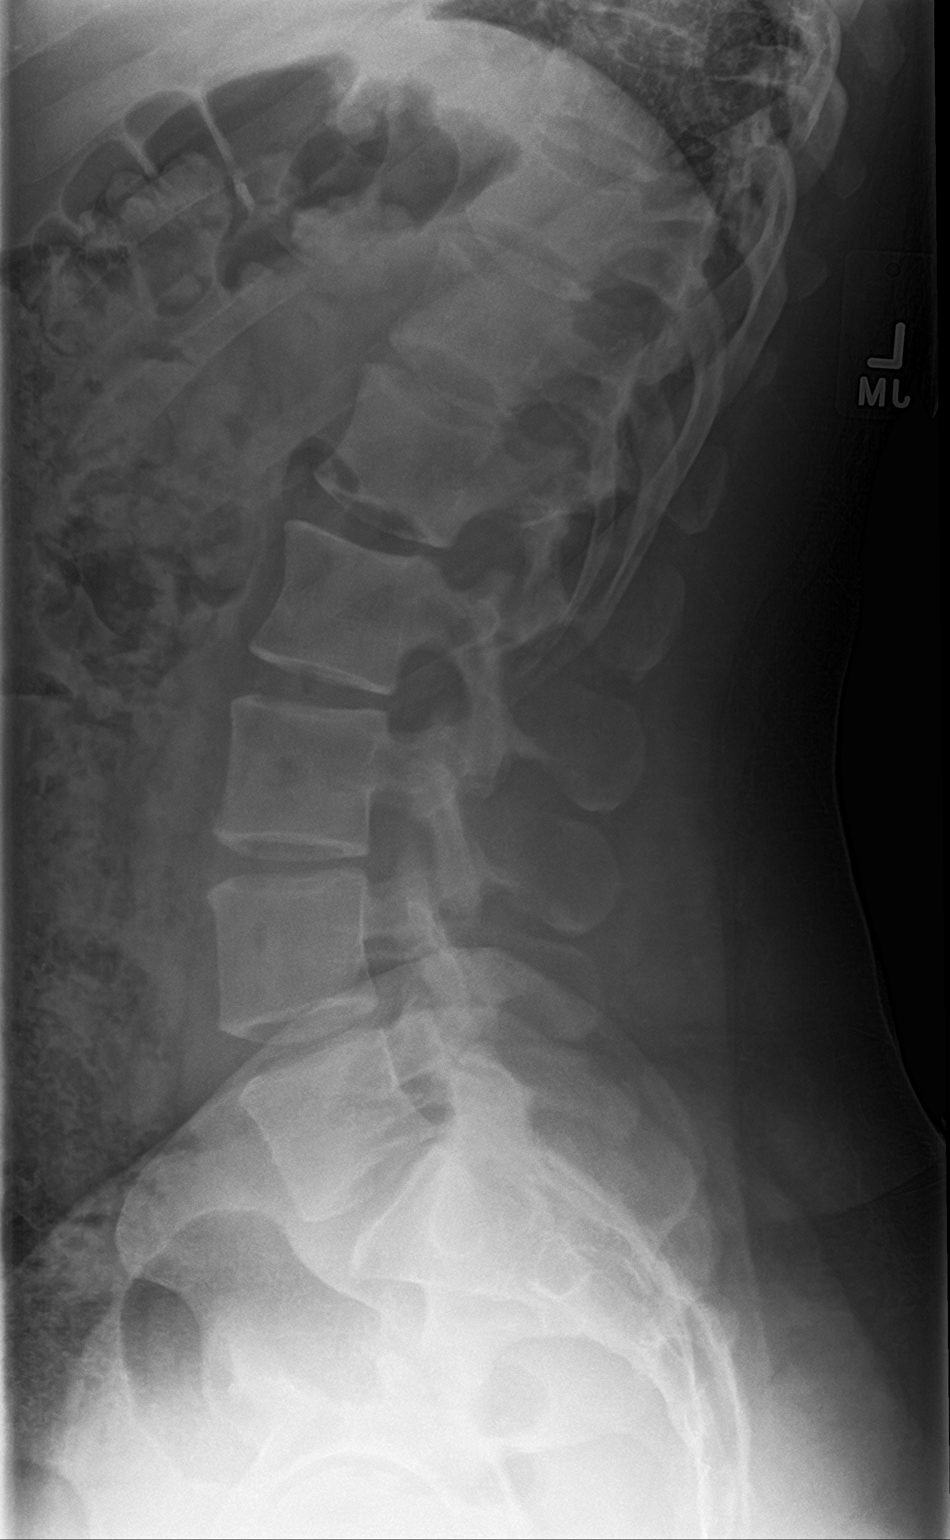

[l-spine spot]
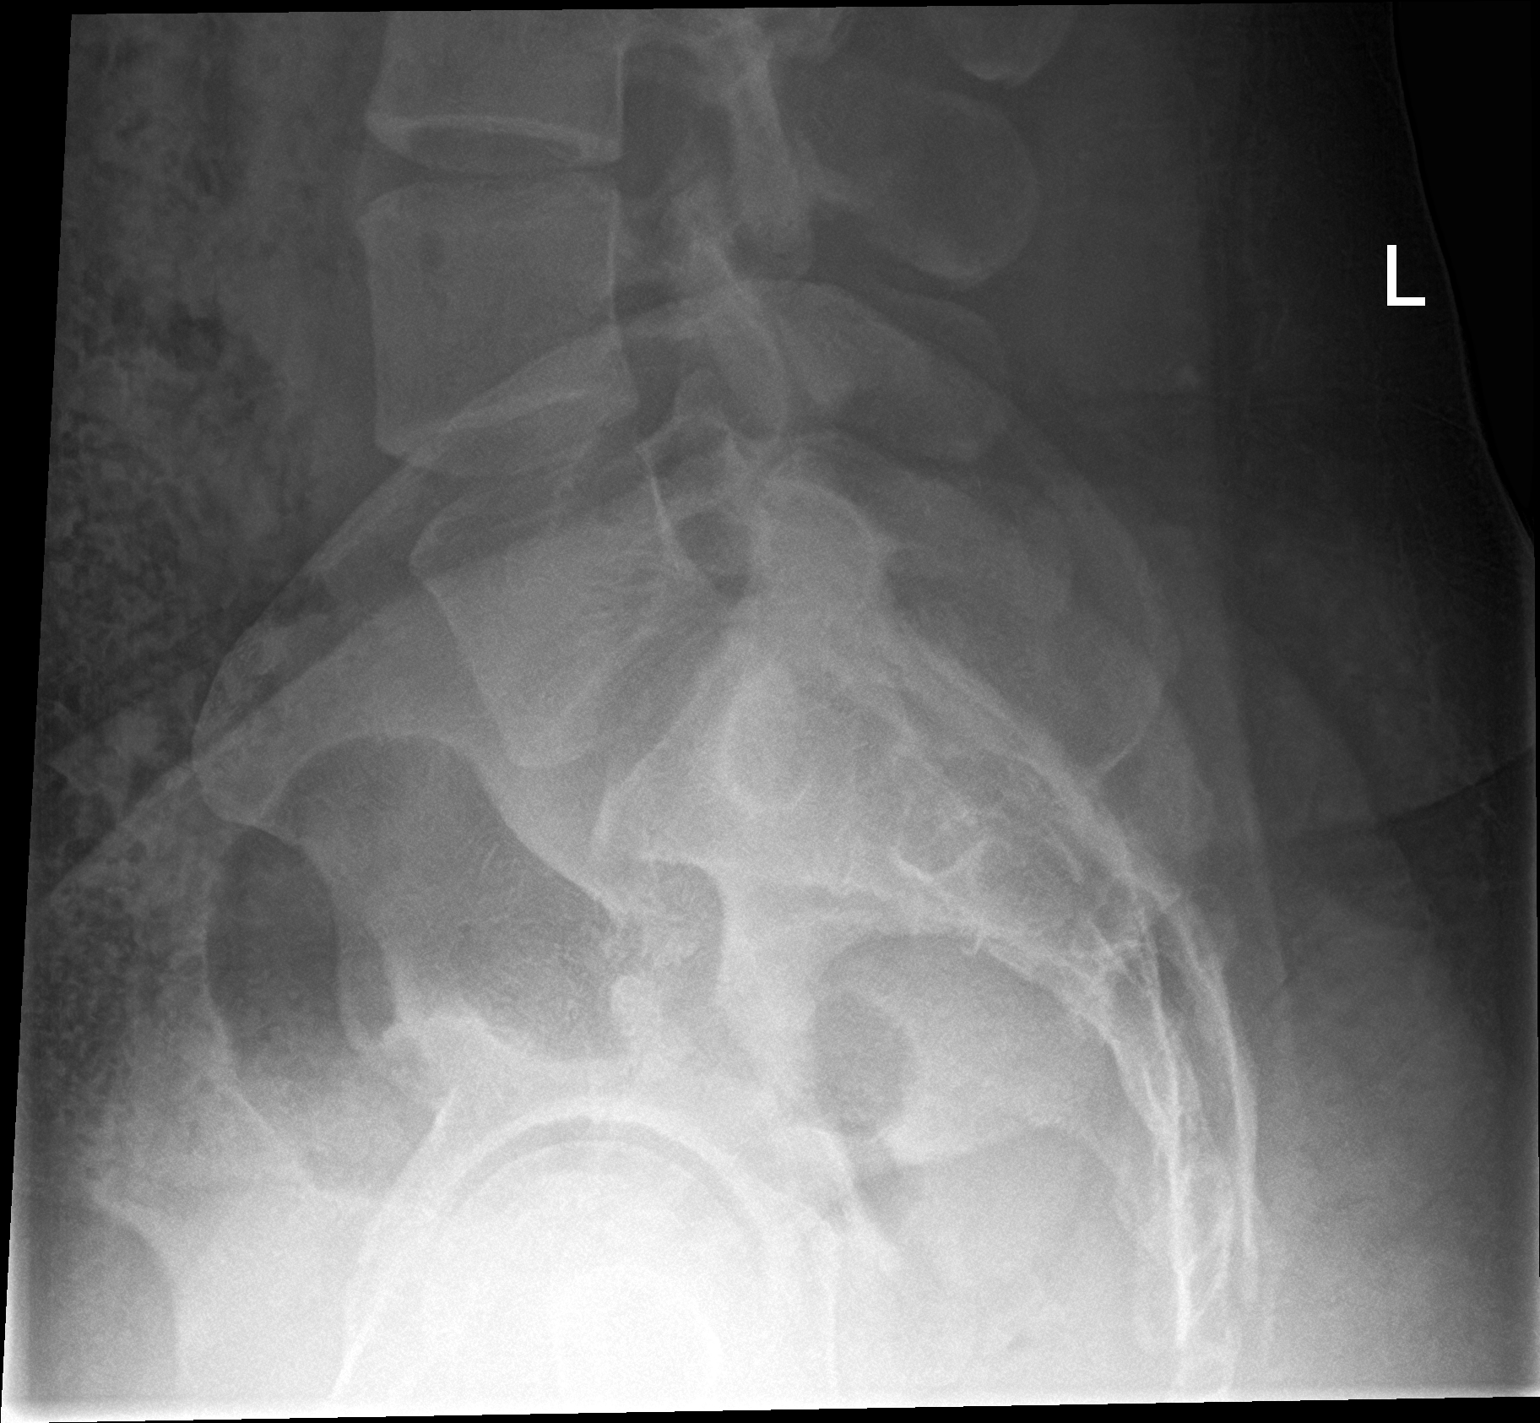

[3 of 3 positions shown; findings below may reference images not displayed]

FINDINGS: Frontal, lateral, and spot lumbosacral lateral images were obtained.
There are 5 non-rib-bearing lumbar type vertebral bodies. There is
thoracolumbar dextroscoliosis with rotatory component. There is no
fracture or spondylolisthesis. There is mild disc space narrowing at
L3-4. Other disc spaces appear unremarkable. No erosive changes.
IMPRESSION: Mild disc space narrowing at L3-4. Other disc spaces appear
unremarkable. No fracture or spondylolisthesis. There is scoliosis.

## 2020-10-20 ENCOUNTER — Other Ambulatory Visit (HOSPITAL_BASED_OUTPATIENT_CLINIC_OR_DEPARTMENT_OTHER): Payer: Self-pay

## 2020-10-21 ENCOUNTER — Ambulatory Visit (INDEPENDENT_AMBULATORY_CARE_PROVIDER_SITE_OTHER): Payer: 59 | Admitting: Family Medicine

## 2020-10-21 ENCOUNTER — Other Ambulatory Visit (HOSPITAL_BASED_OUTPATIENT_CLINIC_OR_DEPARTMENT_OTHER): Payer: Self-pay

## 2020-10-22 ENCOUNTER — Other Ambulatory Visit (HOSPITAL_BASED_OUTPATIENT_CLINIC_OR_DEPARTMENT_OTHER): Payer: Self-pay

## 2020-10-22 MED ORDER — LIOTHYRONINE SODIUM 5 MCG PO TABS
ORAL_TABLET | ORAL | 6 refills | Status: DC
  Filled 2020-10-22 (×3): qty 60, 30d supply, fill #0
  Filled 2020-11-20: qty 60, 30d supply, fill #1
  Filled 2020-12-28: qty 60, 30d supply, fill #2
  Filled 2021-02-01: qty 60, 30d supply, fill #3
  Filled 2021-02-24: qty 60, 30d supply, fill #4
  Filled 2021-03-30: qty 60, 30d supply, fill #5
  Filled 2021-05-21: qty 60, 30d supply, fill #6

## 2020-10-23 ENCOUNTER — Other Ambulatory Visit (HOSPITAL_BASED_OUTPATIENT_CLINIC_OR_DEPARTMENT_OTHER): Payer: Self-pay | Admitting: Endocrinology

## 2020-10-23 DIAGNOSIS — E010 Iodine-deficiency related diffuse (endemic) goiter: Secondary | ICD-10-CM

## 2020-10-28 ENCOUNTER — Ambulatory Visit (HOSPITAL_BASED_OUTPATIENT_CLINIC_OR_DEPARTMENT_OTHER)
Admission: RE | Admit: 2020-10-28 | Discharge: 2020-10-28 | Disposition: A | Discharge status: Home / Self Care | Payer: 59 | Source: Ambulatory Visit | Attending: Endocrinology | Admitting: Endocrinology

## 2020-10-28 ENCOUNTER — Other Ambulatory Visit: Payer: Self-pay

## 2020-10-28 DIAGNOSIS — E010 Iodine-deficiency related diffuse (endemic) goiter: Secondary | ICD-10-CM | POA: Diagnosis not present

## 2020-10-30 ENCOUNTER — Encounter (INDEPENDENT_AMBULATORY_CARE_PROVIDER_SITE_OTHER): Payer: Self-pay | Admitting: Family Medicine

## 2020-11-03 ENCOUNTER — Other Ambulatory Visit: Payer: Self-pay

## 2020-11-03 ENCOUNTER — Ambulatory Visit (INDEPENDENT_AMBULATORY_CARE_PROVIDER_SITE_OTHER): Payer: 59 | Admitting: Family Medicine

## 2020-11-03 ENCOUNTER — Encounter: Payer: Self-pay | Admitting: Family Medicine

## 2020-11-03 VITALS — BP 124/80 | HR 78 | Temp 98.1°F | Ht 68.5 in | Wt 221.4 lb

## 2020-11-03 DIAGNOSIS — Z1159 Encounter for screening for other viral diseases: Secondary | ICD-10-CM

## 2020-11-03 DIAGNOSIS — Z0001 Encounter for general adult medical examination with abnormal findings: Secondary | ICD-10-CM | POA: Diagnosis not present

## 2020-11-03 DIAGNOSIS — Z0000 Encounter for general adult medical examination without abnormal findings: Secondary | ICD-10-CM

## 2020-11-03 DIAGNOSIS — S46211A Strain of muscle, fascia and tendon of other parts of biceps, right arm, initial encounter: Secondary | ICD-10-CM

## 2020-11-03 LAB — LIPID PANEL
Cholesterol: 127 mg/dL (ref 0–200)
HDL: 47.7 mg/dL (ref >39.00)
LDL Cholesterol: 63 mg/dL (ref 0–99)
NonHDL: 79.33
Total CHOL/HDL Ratio: 3
Triglycerides: 80 mg/dL (ref 0.0–149.0)
VLDL: 16 mg/dL (ref 0.0–40.0)

## 2020-11-03 LAB — COMPREHENSIVE METABOLIC PANEL
ALT: 45 U/L (ref 0–53)
AST: 32 U/L (ref 0–37)
Albumin: 4.8 g/dL (ref 3.5–5.2)
Alkaline Phosphatase: 50 U/L (ref 39–117)
BUN: 13 mg/dL (ref 6–23)
CO2: 27 mEq/L (ref 19–32)
Calcium: 9.9 mg/dL (ref 8.4–10.5)
Chloride: 105 mEq/L (ref 96–112)
Creatinine, Ser: 1.18 mg/dL (ref 0.40–1.50)
GFR: 86.81 mL/min (ref >60.00)
Glucose, Bld: 79 mg/dL (ref 70–99)
Potassium: 4.3 mEq/L (ref 3.5–5.1)
Sodium: 140 mEq/L (ref 135–145)
Total Bilirubin: 0.5 mg/dL (ref 0.2–1.2)
Total Protein: 7.2 g/dL (ref 6.0–8.3)

## 2020-11-03 LAB — CBC
HCT: 45.2 % (ref 39.0–52.0)
Hemoglobin: 15.2 g/dL (ref 13.0–17.0)
MCHC: 33.7 g/dL (ref 30.0–36.0)
MCV: 88.4 fl (ref 78.0–100.0)
Platelets: 219 10*3/uL (ref 150.0–400.0)
RBC: 5.11 Mil/uL (ref 4.22–5.81)
RDW: 13.5 % (ref 11.5–15.5)
WBC: 5 10*3/uL (ref 4.0–10.5)

## 2020-11-03 NOTE — Patient Instructions (Addendum)
Give [name] 2-3 business days to get the results of your labs back.   Keep the diet clean and stay active.  Do monthly self testicular checks in the shower. You are feeling for lumps/bumps that don't belong. If you feel anything like this, let me know!  Let us know if you need anything.  Biceps Tendon Disruption (Proximal) Rehab Do exercises exactly as told by your health care provider and adjust them as directed. It is normal to feel mild stretching, pulling, tightness, or discomfort as you do these exercises, but you should stop right away if you feel sudden pain or your pain gets worse.  Stretching and range of motion exercises These exercises warm up your muscles and joints and improve the movement and flexibility of your arm and shoulder. These exercises also help to relieve pain and stiffness. Exercise A: Shoulder flexion, standing   Stand facing a wall. Put your left / right hand on the wall. Slide your left / right hand up the wall. Stop when you feel a stretch in your shoulder, or when you reach the angle recommended by your health care provider. Use your other hand to help raise your arm, if needed. As your hand gets higher, you may need to step closer to the wall. Avoid shrugging your shoulder while you raise your arm. To do this, keep your shoulder blade tucked down toward your spine. Hold for 30 seconds. Slowly return to the starting position. Use your other arm to help, if needed. Repeat 2 times. Complete this exercise 3 times per week. Exercise B: Pendulum   Stand near a wall or a surface that you can hold onto for balance. Bend at the waist and let your left / right arm hang straight down. Use your other arm to support you. Relax your arm and shoulder muscles, and move your hips and your trunk so your left / right arm swings freely. Your arm should swing because of the motion of your body, not because you are using your arm or shoulder muscles. Keep moving so your arm swings in  the following directions, as told by your health care provider: Side to side. Forward and backward. In clockwise and counterclockwise circles. Slowly return to the starting position. Repeat 2 times. Complete this exercise 3 times per week.  Strengthening exercises These exercises build strength and endurance in your arm and shoulder. Endurance is the ability to use your muscles for a long time, even after your muscles get tired. Exercise C: Elbow flexion, neutral  Sit on a stable chair without armrests, or stand. Hold a 3-5 lb weight in your left / right hand, or hold an exercise band with both hands. Your palms should face each other at the starting position. Bend your left / right elbow and move your hand up toward your shoulder. Lead with your thumb, and keep your palm facing the same direction. Keep your other arm straight down, in the starting position. Slowly return to the starting position. Repeat 2-3 times. Complete this exercise 3 times per week. Exercise D: Forearm supination   Sit with your left / right forearm on a table. Your elbow should be below shoulder height. Rest your hand over the edge of the table so your palm faces down. If directed, hold a hammer with your left / right hand. Without moving your elbow, slowly rotate your hand so your palm faces up toward the ceiling. If you are holding a hammer, begin by holding the hammer near the  head. When this exercise gets easier for you, hold the hammer farther down the handle. Hold for 3 seconds. Slowly return to the starting position. Repeat 2 times. Complete this exercise 3 times per week. Exercise E: Scapular retraction   Sit in a stable chair without armrests, or stand. Secure an exercise band to a stable object in front of you so the band is at shoulder height. Hold one end of the exercise band in each hand. Squeeze your shoulder blades together and move your elbows slightly behind you. Do not shrug your  shoulders. Hold for 3 seconds. Slowly return to the starting position. Repeat 2 times. Complete this exercise 3 times per week. Exercise F: Scapular protraction, supine   Lie on your back on a firm surface. Hold a 3-5 lb weight in your left / right hand. Raise your left / right arm straight into the air so your hand is directly above your shoulder joint. Push the weight into the air so your shoulder lifts off of the surface that you are lying on. Do not move your head, neck, or back. Hold for 3 seconds. Slowly return to the starting position. Let your muscles relax completely before you repeat this exercise. Repeat 2 times. Complete this exercise 3 times per week. This information is not intended to replace advice given to you by your health care provider. Make sure you discuss any questions you have with your health care provider. Document Released: 05/09/2005 Document Revised: 01/14/2016 Document Reviewed: 04/17/2015 Elsevier Interactive Patient Education  2017 Elsevier Inc.  

## 2020-11-03 NOTE — Progress Notes (Signed)
Chief Complaint  Patient presents with   Annual Exam    Well Male Albert [name] is here for a complete physical.   His last physical was >1 year ago.  Current diet: in general, an "OK" diet.   Current exercise: lift wts, running, boxing Weight trend: stable Fatigue out of ordinary? No. Seat belt? Yes.    Health maintenance Tetanus- Yes HIV- Yes Hep C- No  Over the past month or so, the patient's had right shoulder pain.  It is more noticeable after he lifts weights.  It is the anterior portion of the shoulder.  There is no specific injury or change in activity otherwise.  He denies any bruising, swelling, or redness.  No neurologic signs or symptoms.  Has not tried much at home so far.  Past Medical History:  Diagnosis Date   Anxiety with depression 09/30/2016   Bipolar disorder (HCC)    Constipation    History of PSVT (paroxysmal supraventricular tachycardia)    Hyperhidrosis    IBS (irritable bowel syndrome)    Joint pain    Low back pain    Major depression, melancholic type    Schizophrenia ([place])    Thyroid disorder    Vitamin D deficiency      Past Surgical History:  Procedure Laterality Date   HAND RECONSTRUCTION Right 11/2017   WISDOM TOOTH EXTRACTION  11/2015    Medications  Current Outpatient Medications on File Prior to Visit  Medication Sig Dispense Refill   chlordiazePOXIDE (LIBRIUM) 10 MG capsule Take 10 mg by mouth daily.     Cholecalciferol (VITAMIN D-3) 125 MCG (5000 UT) TABS Take 10,000 Int'l Units by mouth daily. 30 tablet 0   Insulin Pen Needle (BD PEN NEEDLE NANO 2ND GEN) 32G X 4 MM MISC 1 Device by Does not apply route daily. 100 each 0   Insulin Pen Needle 32G X 4 MM MISC USE AS DIRECTED DAILY 100 each 0   Insulin Pen Needle 32G X 4 MM MISC USE AS DIRECTED TO INJECT SAXENDA 100 each 0   levothyroxine (SYNTHROID) 50 MCG tablet take 1 tablet by mouth every morning on an empty stomach once a day 30 tablet 6   levothyroxine (SYNTHROID) 75 MCG  tablet TAKE 1 TABLET BY MOUTH IN THE MORNING ON AN EMPTY STOMACH ONCE A DAY 30 tablet 12   levothyroxine (SYNTHROID) 75 MCG tablet TAKE ONE TABLET BY MOUTH ON AN EMPTY STOMACH DAILY IN THE MORNING 30 tablet 0   levothyroxine (SYNTHROID) 88 MCG tablet Take 88 mcg by mouth daily before breakfast.     levothyroxine (SYNTHROID) 88 MCG tablet TAKE 1 TABLET BY MOUTH IN THE MORNING ON AN EMPTY STOMACH ONCE A DAY 90 tablet 3   liothyronine (CYTOMEL) 5 MCG tablet take 1 tablet by mouth twice a day 60 tablet 6   liothyronine (CYTOMEL) 5 MCG tablet Take 1 tablet by mouth twice daily 60 tablet 6   Liraglutide -Weight Management 18 MG/3ML SOPN INJECT 3 MG INTO THE SKIN DAILY. 15 mL 0   lithium 300 MG tablet Take 5 per day     lithium carbonate 300 MG capsule Take 3 capsules (900 mg total) by mouth daily with breakfast and 2 capsules (600 mg total) nightly. 450 capsule 0   lithium carbonate 300 MG capsule TAKE 3 CAPSULES BY MOUTH DAILY WITH BREAKFAST AND 2 CAPSULES NIGHTLY 450 capsule 0   lithium carbonate 300 MG capsule TAKE 3 CAPSULES (900 MG TOTAL) BY MOUTH DAILY  WITH BREAKFAST AND 2 CAPSULES (600 MG TOTAL) NIGHTLY. 450 capsule 0   oxybutynin (DITROPAN) 5 MG tablet Take 1 mg by mouth 2 (two) times daily.     phenelzine (NARDIL) 15 MG tablet Take 2 tablets (30 mg total) by mouth 3 (three) times daily 540 tablet 0   phenelzine (NARDIL) 15 MG tablet TAKE 2 TABLETS BY MOUTH 3 TIMES DAILY 540 tablet 0   phenelzine (NARDIL) 15 MG tablet TAKE 2 TABLETS (30 MG TOTAL) BY MOUTH 3 (THREE) TIMES DAILY 540 tablet 0   Phenelzine Sulfate (NARDIL PO) Take 90 mg by mouth. Daily at bedtime     predniSONE (DELTASONE) 20 MG tablet TAKE 2 TABLETS (40 MG TOTAL) BY MOUTH DAILY WITH BREAKFAST FOR 5 DAYS. 10 tablet 0   pyridoxine (B-6) 100 MG tablet Take 100 mg by mouth daily.     trimethoprim-polymyxin b (POLYTRIM) ophthalmic solution Place 1 drop into both eyes every 6 (six) hours. 10 mL 0   trimethoprim-polymyxin b (POLYTRIM)  ophthalmic solution PLACE 1 DROP INTO BOTH EYES EVERY 6 (SIX) HOURS. 10 mL 0   Allergies Allergies  Allergen Reactions   Sulfa Antibiotics     Family history of severe reactions    Family History Family History  Problem Relation Age of Onset   Anxiety disorder Mother    Depression Mother    Sudden death Neg Hx    Heart attack Neg Hx     Review of Systems: Constitutional: no fevers or chills Eye:  no recent significant change in vision Ear/Nose/Mouth/Throat:  Ears:  no hearing loss Nose/Mouth/Throat:  no complaints of nasal congestion, no sore throat Cardiovascular:  no chest pain Respiratory:  no shortness of breath Gastrointestinal:  no abdominal pain, no change in bowel habits GU:  Male: negative for dysuria Musculoskeletal/Extremities:  + R shoulder pain Integumentary (Skin/Breast):  no abnormal skin lesions reported Neurologic:  no headaches Endocrine: No unexpected weight changes Hematologic/Lymphatic:  no night sweats  Exam BP 124/80   Pulse 78   Temp 98.1 F (36.7 C) (Oral)   Ht 5' 8.5" (1.74 m)   Wt 221 lb 6 oz (100.4 kg)   SpO2 98%   BMI 33.17 kg/m  General:  well developed, well nourished, in no apparent distress Skin:  no significant moles, warts, or growths Head:  no masses, lesions, or tenderness Eyes:  pupils equal and round, sclera anicteric without injection Ears:  canals without lesions, TMs shiny without retraction, no obvious effusion, no erythema Nose:  nares patent, septum midline, mucosa normal Throat/Pharynx:  lips and gingiva without lesion; tongue and uvula midline; non-inflamed pharynx; no exudates or postnasal drainage Neck: neck supple without adenopathy, thyromegaly, or masses Lungs:  clear to auscultation, breath sounds equal bilaterally, no respiratory distress Cardio:  regular rate and rhythm, no bruits, no LE edema Abdomen:  abdomen soft, nontender; bowel sounds normal; no masses or organomegaly Genital (male): Deferred Rectal:  Deferred Musculoskeletal: Tenderness to palpation of the right coracoid process.  Normal active and passive range of motion.  Positive speeds in the right.  Negative Neer's, Hawkins, empty can, liftoff, crossover.  Symmetrical muscle groups noted without atrophy or deformity Extremities:  no clubbing, cyanosis, or edema, no deformities, no skin discoloration Neuro:  gait normal; deep tendon reflexes normal and symmetric Psych: well oriented with normal range of affect and appropriate judgment/insight  Assessment and Plan  Well adult exam - Plan: CBC, Comprehensive metabolic panel, Lipid panel  Encounter for hepatitis C screening test for  low risk patient - Plan: Hepatitis C antibody  Strain of right biceps tendon   Well 25 y.o. male. Counseled on diet and exercise. Self testicular exams recommended at least monthly.  Other orders as above. I think he strained his right biceps tendon proximally.  Stretches and exercises provided.  PT if no improvement in the next month.  Ice, Tylenol, heat. Follow up in 1 year pending the above workup. The patient voiced understanding and agreement to the plan.  [place], DO 11/03/20 11:23 AM

## 2020-11-04 ENCOUNTER — Ambulatory Visit (INDEPENDENT_AMBULATORY_CARE_PROVIDER_SITE_OTHER): Payer: 59 | Admitting: Family Medicine

## 2020-11-04 LAB — HEPATITIS C ANTIBODY
Hepatitis C Ab: NONREACTIVE
SIGNAL TO CUT-OFF: 0.01 (ref <1.00)

## 2020-11-16 ENCOUNTER — Other Ambulatory Visit (HOSPITAL_BASED_OUTPATIENT_CLINIC_OR_DEPARTMENT_OTHER): Payer: Self-pay

## 2020-11-16 ENCOUNTER — Other Ambulatory Visit: Payer: Self-pay

## 2020-11-16 ENCOUNTER — Ambulatory Visit (INDEPENDENT_AMBULATORY_CARE_PROVIDER_SITE_OTHER): Payer: 59 | Admitting: Physician Assistant

## 2020-11-16 ENCOUNTER — Encounter (INDEPENDENT_AMBULATORY_CARE_PROVIDER_SITE_OTHER): Payer: Self-pay | Admitting: Physician Assistant

## 2020-11-16 VITALS — BP 112/69 | HR 81 | Temp 97.6°F | Ht 68.0 in | Wt 217.0 lb

## 2020-11-16 DIAGNOSIS — Z9189 Other specified personal risk factors, not elsewhere classified: Secondary | ICD-10-CM | POA: Diagnosis not present

## 2020-11-16 DIAGNOSIS — R632 Polyphagia: Secondary | ICD-10-CM | POA: Diagnosis not present

## 2020-11-16 DIAGNOSIS — E669 Obesity, unspecified: Secondary | ICD-10-CM | POA: Diagnosis not present

## 2020-11-16 DIAGNOSIS — Z6833 Body mass index (BMI) 33.0-33.9, adult: Secondary | ICD-10-CM

## 2020-11-16 DIAGNOSIS — E8881 Metabolic syndrome: Secondary | ICD-10-CM

## 2020-11-16 MED ORDER — LIRAGLUTIDE -WEIGHT MANAGEMENT 18 MG/3ML ~~LOC~~ SOPN
3.0000 mg | PEN_INJECTOR | Freq: Every day | SUBCUTANEOUS | 0 refills | Status: DC
Start: 2020-11-16 — End: 2020-11-19
  Filled 2020-11-16: qty 15, 30d supply, fill #0

## 2020-11-16 MED ORDER — INSULIN PEN NEEDLE 32G X 4 MM MISC
0 refills | Status: DC
  Filled 2020-11-16: qty 100, 90d supply, fill #0

## 2020-11-18 NOTE — Progress Notes (Signed)
Chief Complaint:   OBESITY Albert [name] is here to discuss his progress with his obesity treatment plan along with follow-up of his obesity related diagnoses. [name] is on keeping a food journal and adhering to recommended goals of 1800-1900 calories and 110 grams of protein daily and states he is following his eating plan approximately 50% of the time. [name] states he is lifting weights, runs, and boxing for 60-90 minutes 5 times per week.  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 He is moving to a new apartment with a kitchen in 4 days. He is boxing 2 days a week. He is drinking more water. He eats a Egg Mcmuffin for breakfast, and eats at the cafe for lunch, and eats out for dinner.   Subjective:   1. Polyphagia Usher's appetite is uncontrolled some days even with Saxenda.  2. Insulin resistance [name]'s last insulin level was 6.2. he reports polyphagia. He is exercising regularly.  3. At risk for diabetes mellitus [name] is at higher than average risk for developing diabetes due to obesity.   Assessment/Plan:   1. Polyphagia Intensive lifestyle modifications are the first line treatment for this issue. We discussed several lifestyle modifications today. [name] agreed to discontinue Saxenda, and will start Ozempic 0.5 mg with no refills. He will continue to work on diet, exercise and weight loss efforts. Orders and follow up as documented in patient record.  Counseling Polyphagia is excessive hunger. Causes can include: low blood sugars, hypERthyroidism, PMS, lack of sleep, stress, insulin resistance, diabetes, certain medications, and diets that are deficient in protein and fiber.   2. Insulin resistance Zigmund will continue his meal plan, exercise, and  decreasing simple carbohydrates to help decrease the risk of diabetes. Albert [name] agreed to follow-up with [name] as directed to closely monitor his progress.  3. At risk for diabetes mellitus [name] was given approximately 15 minutes of diabetes education and counseling today. We discussed intensive lifestyle modifications today with an emphasis on weight loss as well as increasing exercise and decreasing simple carbohydrates in his diet. We also reviewed medication options with an emphasis on risk versus benefit of those discussed.   Repetitive spaced learning was employed today to elicit superior memory formation and behavioral change.  4. Class 1 obesity with serious comorbidity and body mass index (BMI) of 33.0 to 33.9 in adult, unspecified obesity type Albert [name] is currently in the action stage of change. As such, his goal is to continue with weight loss efforts. He has agreed to keeping a food journal and adhering to recommended goals of 2000 calories and 100 grams of protein daily.   We discussed various medication options to help [name] with his weight loss efforts and we both agreed to refill Saxenda for 1 month, and refill pen needles #100 with no refills. Will change to Ozempic if we can get authorization from insurance and then will discontinue Saxenda.  - Insulin Pen Needle 32G X 4 MM MISC; USE AS DIRECTED TO INJECT SAXENDA  Dispense: 100 each; Refill: 0 - Liraglutide -Weight Management 18 MG/3ML SOPN; INJECT 3 MG INTO THE SKIN DAILY.  Dispense: 15 mL; Refill: 0  Exercise goals: As is.  Behavioral modification strategies: meal planning and cooking strategies and keeping healthy foods in the home.  Albert [name] has agreed to follow-up with our clinic in 3 weeks. He was informed of the importance of frequent follow-up visits to maximize his success with intensive lifestyle modifications for his multiple health conditions.   Objective:   Blood pressure 112/69, pulse 81, temperature 97.6 F (36.4  C), height 5\' 8"  (1.727 m), weight 217 lb (98.4 kg), SpO2 100 %. Body mass index is 32.99 kg/m.  General: Cooperative, alert, well developed, in no acute distress. HEENT: Conjunctivae and lids unremarkable. Cardiovascular: Regular rhythm.  Lungs: Normal work of breathing. Neurologic: No focal deficits.   Lab Results  Component Value Date   CREATININE 1.18 11/03/2020   BUN 13 11/03/2020   NA 140 11/03/2020   K 4.3 11/03/2020   CL 105 11/03/2020   CO2 27 11/03/2020   Lab Results  Component Value Date   ALT 45 11/03/2020   AST 32 11/03/2020   ALKPHOS 50 11/03/2020   BILITOT 0.5 11/03/2020   Lab Results  Component Value Date   HGBA1C 5.0 02/03/2020   HGBA1C 4.8 05/28/2019   Lab Results  Component Value Date   INSULIN 6.2 02/03/2020   INSULIN 19.0 05/28/2019   Lab Results  Component Value Date   TSH 0.346 (L) 02/03/2020   Lab Results  Component Value Date   CHOL 127 11/03/2020   HDL 47.70 11/03/2020   LDLCALC 63 11/03/2020   TRIG 80.0 11/03/2020   CHOLHDL 3 11/03/2020   Lab Results  Component Value Date   VD25OH 65.6 02/03/2020   VD25OH 49.7 05/28/2019   VD25OH 47.23 05/29/2018   Lab Results  Component Value Date   WBC 5.0 11/03/2020   HGB 15.2 11/03/2020   HCT 45.2 11/03/2020   MCV 88.4 11/03/2020   PLT 219.0 11/03/2020   No results found for: IRON, TIBC, FERRITIN  Attestation Statements:   Reviewed by clinician on day of visit: allergies, medications, problem list, medical history, surgical history, family history, social history, and previous encounter notes.   Wilhemena Durie, am acting as transcriptionist for [employer], PA-C.  I have reviewed the above documentation for accuracy and completeness, and I agree with the above. Abby Potash, PA-C

## 2020-11-19 ENCOUNTER — Other Ambulatory Visit (HOSPITAL_BASED_OUTPATIENT_CLINIC_OR_DEPARTMENT_OTHER): Payer: Self-pay

## 2020-11-19 MED ORDER — OZEMPIC (0.25 OR 0.5 MG/DOSE) 2 MG/1.5ML ~~LOC~~ SOPN
0.5000 mg | PEN_INJECTOR | SUBCUTANEOUS | 0 refills | Status: DC
  Filled 2020-11-19: qty 1.5, 28d supply, fill #0

## 2020-11-20 ENCOUNTER — Other Ambulatory Visit (HOSPITAL_BASED_OUTPATIENT_CLINIC_OR_DEPARTMENT_OTHER): Payer: Self-pay

## 2020-11-20 ENCOUNTER — Other Ambulatory Visit: Payer: Self-pay

## 2020-11-24 ENCOUNTER — Other Ambulatory Visit (HOSPITAL_BASED_OUTPATIENT_CLINIC_OR_DEPARTMENT_OTHER): Payer: Self-pay

## 2020-11-24 ENCOUNTER — Encounter (INDEPENDENT_AMBULATORY_CARE_PROVIDER_SITE_OTHER): Payer: Self-pay

## 2020-11-24 MED ORDER — OXYBUTYNIN CHLORIDE 5 MG PO TABS
5.0000 mg | ORAL_TABLET | Freq: Two times a day (BID) | ORAL | 5 refills | Status: DC
  Filled 2020-11-24: qty 60, 30d supply, fill #0
  Filled 2021-05-21 – 2021-11-12 (×2): qty 60, 30d supply, fill #1

## 2020-11-26 ENCOUNTER — Other Ambulatory Visit (HOSPITAL_BASED_OUTPATIENT_CLINIC_OR_DEPARTMENT_OTHER): Payer: Self-pay

## 2020-12-01 ENCOUNTER — Other Ambulatory Visit (HOSPITAL_BASED_OUTPATIENT_CLINIC_OR_DEPARTMENT_OTHER): Payer: Self-pay

## 2020-12-01 ENCOUNTER — Other Ambulatory Visit: Payer: Self-pay | Admitting: Family Medicine

## 2020-12-01 MED ORDER — LINACLOTIDE 290 MCG PO CAPS
ORAL_CAPSULE | ORAL | 2 refills | Status: DC
  Filled 2020-12-01: qty 30, 30d supply, fill #0
  Filled 2020-12-02: qty 90, 90d supply, fill #0

## 2020-12-02 ENCOUNTER — Other Ambulatory Visit (HOSPITAL_BASED_OUTPATIENT_CLINIC_OR_DEPARTMENT_OTHER): Payer: Self-pay

## 2020-12-07 ENCOUNTER — Other Ambulatory Visit: Payer: Self-pay

## 2020-12-07 ENCOUNTER — Ambulatory Visit (INDEPENDENT_AMBULATORY_CARE_PROVIDER_SITE_OTHER): Payer: 59 | Admitting: Family Medicine

## 2020-12-07 ENCOUNTER — Other Ambulatory Visit (HOSPITAL_BASED_OUTPATIENT_CLINIC_OR_DEPARTMENT_OTHER): Payer: Self-pay

## 2020-12-07 ENCOUNTER — Encounter (INDEPENDENT_AMBULATORY_CARE_PROVIDER_SITE_OTHER): Payer: Self-pay | Admitting: Family Medicine

## 2020-12-07 VITALS — BP 92/61 | HR 90 | Temp 97.7°F | Ht 68.0 in | Wt 212.0 lb

## 2020-12-07 DIAGNOSIS — F339 Major depressive disorder, recurrent, unspecified: Secondary | ICD-10-CM | POA: Diagnosis not present

## 2020-12-07 DIAGNOSIS — E8881 Metabolic syndrome: Secondary | ICD-10-CM

## 2020-12-07 DIAGNOSIS — E88819 Insulin resistance, unspecified: Secondary | ICD-10-CM

## 2020-12-07 DIAGNOSIS — Z6835 Body mass index (BMI) 35.0-35.9, adult: Secondary | ICD-10-CM | POA: Diagnosis not present

## 2020-12-07 MED ORDER — OZEMPIC (1 MG/DOSE) 4 MG/3ML ~~LOC~~ SOPN
1.0000 mg | PEN_INJECTOR | SUBCUTANEOUS | 0 refills | Status: DC
  Filled 2020-12-07: qty 3, 28d supply, fill #0

## 2020-12-14 ENCOUNTER — Encounter (INDEPENDENT_AMBULATORY_CARE_PROVIDER_SITE_OTHER): Payer: Self-pay | Admitting: Family Medicine

## 2020-12-14 NOTE — Progress Notes (Signed)
Chief Complaint:   OBESITY Albert [name] is here to discuss his progress with his obesity treatment plan along with follow-up of his obesity related diagnoses. Dolph is on keeping a food journal and adhering to recommended goals of 2000 calories and 100 grams of protein and 1900 calories and 130 grams of protein, or practicing portion control and making smarter food choices, such as increasing vegetables and decreasing simple carbohydrates and states he is following his eating plan approximately 80% of the time. Prabhav states he is doing weights, boxing and jiu-situs for 60-90 minutes 5 times per week.  Today's visit was #: 22 Starting weight: 236 lbs Starting date: 05/28/2019 Today's weight: 212 lbs Today's date: 12/07/2020 Total lbs lost to date: 24 lbs Total lbs lost since last in-office visit: 5 lbs  Interim History: Albert [name] is eating at home more vs eating out. He now has a kitchen. He did not in his prior living situation. He is not journaling consistently but making good choices. He is not snacking much.  Subjective:   1. Insulin resistance [name]'s Saxenda was discontinued and Ozempic was started at his last office visit. He feels Ozempic is working well for appetite suppression. His constipation is managed well with Linzess and Senna.  Lab Results  Component Value Date   INSULIN 6.2 02/03/2020   INSULIN 19.0 05/28/2019   Lab Results  Component Value Date   HGBA1C 5.0 02/03/2020   2. Recurrent major depressive disorder, remission status unspecified ([place]) Albert [name]'s mood is stable on Nardil. He has been off of lithium under the direction of his psychiatrist for a few months. He is seeing a counselor regularly.   Assessment/Plan:   1. Insulin resistance Increase Ozempic to 1 mg weekly with no refills.  - Semaglutide, 1 MG/DOSE, (OZEMPIC, 1 MG/DOSE,) 4 MG/3ML SOPN; Inject 1 mg into the skin once a week.  Dispense: 3 mL; Refill: 0  2. Recurrent major depressive disorder,  remission status unspecified (HCC) Albert [name] will follow up with psychiatry. He will continue Nardil. B  3. Obesity: BMI 32.24 [name] is currently in the action stage of change. As such, his goal is to continue with weight loss efforts. He has agreed to keeping a food journal and adhering to recommended goals of 2000 calories and 100 grams of protein daily, or practicing portion control and making smarter food choices, such as increasing vegetables and decreasing simple carbohydrates.   Add protein snacks daily.  Exercise goals:  As is.  Behavioral modification strategies: increasing lean protein intake.  Willam has agreed to follow-up with our clinic in 3-4 weeks.  Objective:   Blood pressure 92/61, pulse 90, temperature 97.7 F (36.5 C), height '5\' 8"'$  (1.727 m), weight 212 lb (96.2 kg), SpO2 98 %. Body mass index is 32.23 kg/m.  General: Cooperative, alert, well developed, in no acute distress. HEENT: Conjunctivae and lids unremarkable. Cardiovascular: Regular rhythm.  Lungs: Normal work of breathing. Neurologic: No focal deficits.   Lab Results  Component Value Date   CREATININE 1.18 11/03/2020   BUN 13 11/03/2020   NA 140 11/03/2020   K 4.3 11/03/2020   CL 105 11/03/2020   CO2 27 11/03/2020   Lab Results  Component Value Date   ALT 45 11/03/2020   AST 32 11/03/2020   ALKPHOS 50 11/03/2020   BILITOT 0.5 11/03/2020   Lab Results  Component Value Date   HGBA1C 5.0 02/03/2020   HGBA1C 4.8 05/28/2019   Lab Results  Component Value Date  INSULIN 6.2 02/03/2020   INSULIN 19.0 05/28/2019   Lab Results  Component Value Date   TSH 0.346 (L) 02/03/2020   Lab Results  Component Value Date   CHOL 127 11/03/2020   HDL 47.70 11/03/2020   LDLCALC 63 11/03/2020   TRIG 80.0 11/03/2020   CHOLHDL 3 11/03/2020   Lab Results  Component Value Date   VD25OH 65.6 02/03/2020   VD25OH 49.7 05/28/2019   VD25OH 47.23 05/29/2018   Lab Results  Component Value Date   WBC 5.0  11/03/2020   HGB 15.2 11/03/2020   HCT 45.2 11/03/2020   MCV 88.4 11/03/2020   PLT 219.0 11/03/2020   No results found for: IRON, TIBC, FERRITIN  Attestation Statements:   Reviewed by clinician on day of visit: allergies, medications, problem list, medical history, surgical history, family history, social history, and previous encounter notes.  I, Lizbeth Bark, RMA, am acting as Location manager for [employer], [place].  I have reviewed the above documentation for accuracy and completeness, and I agree with the above. -  Georgianne Fick, FNP

## 2020-12-18 ENCOUNTER — Other Ambulatory Visit (HOSPITAL_BASED_OUTPATIENT_CLINIC_OR_DEPARTMENT_OTHER): Payer: Self-pay

## 2020-12-18 MED ORDER — PRAMIPEXOLE DIHYDROCHLORIDE 0.25 MG PO TABS
ORAL_TABLET | ORAL | 2 refills | Status: DC
  Filled 2020-12-18: qty 30, 30d supply, fill #0
  Filled 2021-01-26: qty 30, 30d supply, fill #1

## 2020-12-18 MED ORDER — PHENELZINE SULFATE 15 MG PO TABS
ORAL_TABLET | ORAL | 0 refills | Status: DC
  Filled 2020-12-18 (×2): qty 540, 90d supply, fill #0

## 2020-12-21 ENCOUNTER — Other Ambulatory Visit (HOSPITAL_BASED_OUTPATIENT_CLINIC_OR_DEPARTMENT_OTHER): Payer: Self-pay

## 2020-12-21 DIAGNOSIS — E039 Hypothyroidism, unspecified: Secondary | ICD-10-CM | POA: Diagnosis not present

## 2020-12-21 DIAGNOSIS — E291 Testicular hypofunction: Secondary | ICD-10-CM | POA: Diagnosis not present

## 2020-12-22 ENCOUNTER — Other Ambulatory Visit (HOSPITAL_BASED_OUTPATIENT_CLINIC_OR_DEPARTMENT_OTHER): Payer: Self-pay

## 2020-12-22 MED ORDER — LEVOTHYROXINE SODIUM 25 MCG PO TABS
ORAL_TABLET | ORAL | 0 refills | Status: DC
  Filled 2020-12-22: qty 30, 30d supply, fill #0

## 2020-12-28 ENCOUNTER — Ambulatory Visit (INDEPENDENT_AMBULATORY_CARE_PROVIDER_SITE_OTHER): Payer: 59 | Admitting: Family Medicine

## 2020-12-28 ENCOUNTER — Other Ambulatory Visit: Payer: Self-pay

## 2020-12-28 ENCOUNTER — Other Ambulatory Visit (HOSPITAL_BASED_OUTPATIENT_CLINIC_OR_DEPARTMENT_OTHER): Payer: Self-pay

## 2020-12-28 ENCOUNTER — Encounter (INDEPENDENT_AMBULATORY_CARE_PROVIDER_SITE_OTHER): Payer: Self-pay | Admitting: Family Medicine

## 2020-12-28 VITALS — BP 113/69 | HR 76 | Temp 97.9°F | Ht 68.0 in | Wt 208.0 lb

## 2020-12-28 DIAGNOSIS — K5909 Other constipation: Secondary | ICD-10-CM

## 2020-12-28 DIAGNOSIS — E8881 Metabolic syndrome: Secondary | ICD-10-CM

## 2020-12-28 DIAGNOSIS — Z6835 Body mass index (BMI) 35.0-35.9, adult: Secondary | ICD-10-CM

## 2020-12-28 DIAGNOSIS — Z9189 Other specified personal risk factors, not elsewhere classified: Secondary | ICD-10-CM

## 2020-12-28 MED ORDER — OZEMPIC (1 MG/DOSE) 4 MG/3ML ~~LOC~~ SOPN
1.0000 mg | PEN_INJECTOR | SUBCUTANEOUS | 0 refills | Status: DC
  Filled 2020-12-28 – 2020-12-29 (×2): qty 3, 28d supply, fill #0

## 2020-12-28 NOTE — Progress Notes (Signed)
Chief Complaint:   OBESITY Albert [name] is here to discuss his progress with his obesity treatment plan along with follow-up of his obesity related diagnoses. Albert [name] is on keeping a food journal and adhering to recommended goals of 2000 calories + 100 grams of protein calories and practicing portion control and making smarter food choices, such as increasing vegetables and decreasing simple carbohydrates and states he is following his eating plan approximately 70% of the time. Albert [name] states he is doing weights and running for  60 minutes 5-6 times per week.  Today's visit was #: 23 Starting weight: 236 lbs Starting date: 05/28/2019 Today's weight: 208 lbs Today's date: 12/28/2020 Total lbs lost to date: 28 lbs Total lbs lost since last in-office visit: 4 lbs  Interim History: Albert [name] has been traveling recently and notes food choices haven't always been the best. When he is at home he is cooking more often since he now has a kitchen. He is skipping breakfast due to lack of hunger. He is currently waiting to see what interviews he will get from med schools. 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  Subjective:   1. Insulin resistance Albert [name] stated Ozempic has greatly reduced his appetite. He is skipping breakfast quite often.  Lab Results  Component Value Date   INSULIN 6.2 02/03/2020   INSULIN 19.0 05/28/2019   Lab Results  Component Value Date   HGBA1C 5.0 02/03/2020    2. Other constipation Albert [name]'s constipation is controlled with linaclotide and senna.  3. At risk for impaired metabolic function Albert [name] is at risk for impaired metabolic function due to inadequate protein intake.  Assessment/Plan:   1. Insulin resistance Albert [name] will continue to work on weight loss, exercise, and decreasing simple carbohydrates to help decrease the risk of diabetes. Albert [name] agreed to follow-up with [name] as directed to closely monitor his progress. We will refill Ozempic  1 mg.  - Semaglutide, 1 MG/DOSE, (OZEMPIC, 1 MG/DOSE,) 4 MG/3ML SOPN; Inject 1 mg into the skin once a week.  Dispense: 3 mL; Refill: 0  2. Other constipation Albert [name] was informed that a decrease in bowel movement frequency is normal while losing weight, but stools should not be hard or painful. Orders and follow up as documented in patient record.   Counseling Getting to Good Bowel Health: Your goal is to have one soft bowel movement each day. Drink at least 8 glasses of water each day. Eat plenty of fiber (goal is over 25 grams each day). It is best to get most of your fiber from dietary sources which includes leafy green vegetables, fresh fruit, and whole grains. You may need to add fiber with the help of OTC fiber supplements. These include Metamucil, Citrucel, and Flaxseed. If you are still having trouble, try adding Miralax or Magnesium Citrate. If all of these changes do not work, Cabin crew.   3. At risk for impaired metabolic function Albert [name] was given approximately 15 minutes of impaired  metabolic function prevention counseling today. We discussed intensive lifestyle modifications today with an emphasis on specific nutrition and exercise instructions and strategies.   Repetitive spaced learning was employed today to elicit superior memory formation and behavioral change.   4. Obesity: BMI 31.63 Albert [name] is currently in the action stage of change. As such, his goal is to continue with weight loss efforts. He has agreed to keeping a food journal and adhering to recommended goals of 2000 calories and 100 grams of  protein and practicing portion control and making smarter food choices, such as increasing vegetables and decreasing simple carbohydrates.   Albert [name] will at least count protein daily. He may substitute protein shakes or bar for one meal per day. He is waiting to hear about medical school interviews.  Exercise goals:  As is.  Behavioral modification strategies: increasing  lean protein intake and meal planning and cooking strategies.  Mythias has agreed to follow-up with our clinic in 3-4 weeks. He was informed of the importance of frequent follow-up visits to maximize his success with intensive lifestyle modifications for his multiple health conditions.   Objective:   Blood pressure 113/69, pulse 76, temperature 97.9 F (36.6 C), height '5\' 8"'$  (1.727 m), weight 208 lb (94.3 kg), SpO2 97 %. Body mass index is 31.63 kg/m.  General: Cooperative, alert, well developed, in no acute distress. HEENT: Conjunctivae and lids unremarkable. Cardiovascular: Regular rhythm.  Lungs: Normal work of breathing. Neurologic: No focal deficits.   Lab Results  Component Value Date   CREATININE 1.18 11/03/2020   BUN 13 11/03/2020   NA 140 11/03/2020   K 4.3 11/03/2020   CL 105 11/03/2020   CO2 27 11/03/2020   Lab Results  Component Value Date   ALT 45 11/03/2020   AST 32 11/03/2020   ALKPHOS 50 11/03/2020   BILITOT 0.5 11/03/2020   Lab Results  Component Value Date   HGBA1C 5.0 02/03/2020   HGBA1C 4.8 05/28/2019   Lab Results  Component Value Date   INSULIN 6.2 02/03/2020   INSULIN 19.0 05/28/2019   Lab Results  Component Value Date   TSH 0.346 (L) 02/03/2020   Lab Results  Component Value Date   CHOL 127 11/03/2020   HDL 47.70 11/03/2020   LDLCALC 63 11/03/2020   TRIG 80.0 11/03/2020   CHOLHDL 3 11/03/2020   Lab Results  Component Value Date   VD25OH 65.6 02/03/2020   VD25OH 49.7 05/28/2019   VD25OH 47.23 05/29/2018   Lab Results  Component Value Date   WBC 5.0 11/03/2020   HGB 15.2 11/03/2020   HCT 45.2 11/03/2020   MCV 88.4 11/03/2020   PLT 219.0 11/03/2020   No results found for: IRON, TIBC, FERRITIN  Attestation Statements:   Reviewed by clinician on day of visit: allergies, medications, problem list, medical history, surgical history, family history, social history, and previous encounter notes.  I, Lizbeth Bark, RMA, am acting  as Location manager for [employer], [place].   I have reviewed the above documentation for accuracy and completeness, and I agree with the above. -  Georgianne Fick, FNP

## 2020-12-29 ENCOUNTER — Other Ambulatory Visit (HOSPITAL_BASED_OUTPATIENT_CLINIC_OR_DEPARTMENT_OTHER): Payer: Self-pay

## 2021-01-14 ENCOUNTER — Encounter (INDEPENDENT_AMBULATORY_CARE_PROVIDER_SITE_OTHER): Payer: Self-pay

## 2021-01-20 ENCOUNTER — Other Ambulatory Visit (HOSPITAL_BASED_OUTPATIENT_CLINIC_OR_DEPARTMENT_OTHER): Payer: Self-pay

## 2021-01-20 ENCOUNTER — Ambulatory Visit (INDEPENDENT_AMBULATORY_CARE_PROVIDER_SITE_OTHER): Payer: 59 | Admitting: Family Medicine

## 2021-01-20 ENCOUNTER — Other Ambulatory Visit: Payer: Self-pay

## 2021-01-20 ENCOUNTER — Encounter (INDEPENDENT_AMBULATORY_CARE_PROVIDER_SITE_OTHER): Payer: Self-pay

## 2021-01-20 VITALS — BP 95/58 | HR 88 | Temp 97.9°F | Ht 68.0 in | Wt 209.0 lb

## 2021-01-20 DIAGNOSIS — Z6835 Body mass index (BMI) 35.0-35.9, adult: Secondary | ICD-10-CM | POA: Diagnosis not present

## 2021-01-20 DIAGNOSIS — E8881 Metabolic syndrome: Secondary | ICD-10-CM | POA: Diagnosis not present

## 2021-01-20 DIAGNOSIS — F339 Major depressive disorder, recurrent, unspecified: Secondary | ICD-10-CM | POA: Diagnosis not present

## 2021-01-20 MED ORDER — WEGOVY 1.7 MG/0.75ML ~~LOC~~ SOAJ
1.7000 mg | SUBCUTANEOUS | 0 refills | Status: DC
Start: 2021-01-20 — End: 2021-02-08
  Filled 2021-01-20: qty 3, 28d supply, fill #0

## 2021-01-20 NOTE — Progress Notes (Signed)
Chief Complaint:   OBESITY Albert [name] is here to discuss his progress with his obesity treatment plan along with follow-up of his obesity related diagnoses. [name] is on keeping a food journal and adhering to recommended goals of 2000 calories and 100 grams of protein and practicing portion control and making smarter food choices, such as increasing vegetables and decreasing simple carbohydrates and states he is following his eating plan approximately 75% of the time. Zerek states he is doing cardio for 60 minutes 5 times per week and resistance for 120 minutes 5 times per week.  Today's visit was #: 24 Starting weight: 236 lbs Starting date: 05/28/2019 Today's weight: 209 lbs Today's date: 01/20/2021 Total lbs lost to date: 27 lbs Total lbs lost since last in-office visit: +1 Interim History: Deston is journaling protein and getting 130-140 grams per day. He is not journaling other foods. 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 His goal is to lose to 15% body fat. Body fat is at 27.1% today.  Subjective:   1. Insulin resistance Braxson has been on Ozempic 1 mg. He notes his appetite has increased somewhat recently.  Lab Results  Component Value Date   INSULIN 6.2 02/03/2020   INSULIN 19.0 05/28/2019   Lab Results  Component Value Date   HGBA1C 5.0 02/03/2020    2. Recurrent major depressive disorder, remission status unspecified ([place]) Tennyson sees psychiatry regularly. His current mood is stable. He is not on Lithium currently but anticipates he will be going back on it.  Assessment/Plan:   1. Insulin resistance Ryan agrees to change to [employer] 1.7 mg weekly. He will continue to work on weight loss, exercise, and decreasing simple carbohydrates to help decrease the risk of diabetes. Ryn agreed to follow-up with [name] as directed to closely monitor his progress.  - Semaglutide-Weight Management (WEGOVY) 1.7 MG/0.75ML SOAJ; Inject  1.7 mg into the skin once a week.  Dispense: 3 mL; Refill: 0  2. Recurrent major depressive disorder, remission status unspecified (HCC) Homar will continue to follow up with psychiatry. He will continue all medicines as directed.  3. Obesity: BMI 31.63 Kashawn is currently in the action stage of change. As such, his goal is to continue with weight loss efforts. He has agreed to keeping a food journal and adhering to recommended goals of 1900-2000 calories and 100+ grams of protein daily.  Simcha will journal all food at least 5 days per week.  Exercise goals:  As is.  Behavioral modification strategies: keeping a strict food journal.  Maher has agreed to follow-up with our clinic in 3-4 weeks.  Objective:   Blood pressure (!) 95/58, pulse 88, temperature 97.9 F (36.6 C), height '5\' 8"'$  (1.727 m), weight 209 lb (94.8 kg), SpO2 97 %. Body mass index is 31.78 kg/m.  General: Cooperative, alert, well developed, in no acute distress. HEENT: Conjunctivae and lids unremarkable. Cardiovascular: Regular rhythm.  Lungs: Normal work of breathing. Neurologic: No focal deficits.   Lab Results  Component Value Date   CREATININE 1.18 11/03/2020   BUN 13 11/03/2020   NA 140 11/03/2020   K 4.3 11/03/2020   CL 105 11/03/2020   CO2 27 11/03/2020   Lab Results  Component Value Date   ALT 45 11/03/2020   AST 32 11/03/2020   ALKPHOS 50 11/03/2020   BILITOT 0.5 11/03/2020   Lab Results  Component Value Date   HGBA1C 5.0 02/03/2020  HGBA1C 4.8 05/28/2019   Lab Results  Component Value Date   INSULIN 6.2 02/03/2020   INSULIN 19.0 05/28/2019   Lab Results  Component Value Date   TSH 0.346 (L) 02/03/2020   Lab Results  Component Value Date   CHOL 127 11/03/2020   HDL 47.70 11/03/2020   LDLCALC 63 11/03/2020   TRIG 80.0 11/03/2020   CHOLHDL 3 11/03/2020   Lab Results  Component Value Date   VD25OH 65.6 02/03/2020   VD25OH 49.7 05/28/2019   VD25OH 47.23 05/29/2018   Lab  Results  Component Value Date   WBC 5.0 11/03/2020   HGB 15.2 11/03/2020   HCT 45.2 11/03/2020   MCV 88.4 11/03/2020   PLT 219.0 11/03/2020   No results found for: IRON, TIBC, FERRITIN   Attestation Statements:   Reviewed by clinician on day of visit: allergies, medications, problem list, medical history, surgical history, family history, social history, and previous encounter notes.   I, Lizbeth Bark, RMA, am acting as Location manager for [employer], [place].   I have reviewed the above documentation for accuracy and completeness, and I agree with the above. -  Georgianne Fick, FNP

## 2021-01-21 ENCOUNTER — Other Ambulatory Visit (HOSPITAL_BASED_OUTPATIENT_CLINIC_OR_DEPARTMENT_OTHER): Payer: Self-pay

## 2021-01-23 ENCOUNTER — Encounter (INDEPENDENT_AMBULATORY_CARE_PROVIDER_SITE_OTHER): Payer: Self-pay | Admitting: Family Medicine

## 2021-01-26 ENCOUNTER — Other Ambulatory Visit (HOSPITAL_BASED_OUTPATIENT_CLINIC_OR_DEPARTMENT_OTHER): Payer: Self-pay

## 2021-01-26 DIAGNOSIS — F3341 Major depressive disorder, recurrent, in partial remission: Secondary | ICD-10-CM | POA: Diagnosis not present

## 2021-01-26 DIAGNOSIS — F411 Generalized anxiety disorder: Secondary | ICD-10-CM | POA: Diagnosis not present

## 2021-01-26 MED ORDER — LEVOTHYROXINE SODIUM 25 MCG PO TABS
ORAL_TABLET | ORAL | 11 refills | Status: DC
  Filled 2021-01-26: qty 30, 30d supply, fill #0

## 2021-01-26 MED ORDER — PRAMIPEXOLE DIHYDROCHLORIDE 1 MG PO TABS
ORAL_TABLET | ORAL | 2 refills | Status: DC
  Filled 2021-01-26: qty 30, 30d supply, fill #0
  Filled 2021-03-05: qty 30, 30d supply, fill #1
  Filled 2021-03-30: qty 30, 30d supply, fill #2

## 2021-02-01 ENCOUNTER — Other Ambulatory Visit (HOSPITAL_BASED_OUTPATIENT_CLINIC_OR_DEPARTMENT_OTHER): Payer: Self-pay

## 2021-02-01 ENCOUNTER — Other Ambulatory Visit: Payer: Self-pay | Admitting: Family Medicine

## 2021-02-01 MED ORDER — LINACLOTIDE 290 MCG PO CAPS
ORAL_CAPSULE | ORAL | 2 refills | Status: DC
  Filled 2021-02-01: qty 30, fill #0
  Filled 2021-05-21: qty 90, 90d supply, fill #0

## 2021-02-08 ENCOUNTER — Other Ambulatory Visit: Payer: Self-pay

## 2021-02-08 ENCOUNTER — Ambulatory Visit (INDEPENDENT_AMBULATORY_CARE_PROVIDER_SITE_OTHER): Payer: 59 | Admitting: Physician Assistant

## 2021-02-08 ENCOUNTER — Encounter (INDEPENDENT_AMBULATORY_CARE_PROVIDER_SITE_OTHER): Payer: Self-pay | Admitting: Physician Assistant

## 2021-02-08 ENCOUNTER — Other Ambulatory Visit (HOSPITAL_BASED_OUTPATIENT_CLINIC_OR_DEPARTMENT_OTHER): Payer: Self-pay

## 2021-02-08 VITALS — BP 125/77 | HR 79 | Temp 98.1°F | Ht 68.0 in | Wt 209.0 lb

## 2021-02-08 DIAGNOSIS — E669 Obesity, unspecified: Secondary | ICD-10-CM

## 2021-02-08 DIAGNOSIS — Z6833 Body mass index (BMI) 33.0-33.9, adult: Secondary | ICD-10-CM

## 2021-02-08 DIAGNOSIS — Z9189 Other specified personal risk factors, not elsewhere classified: Secondary | ICD-10-CM

## 2021-02-08 DIAGNOSIS — E8881 Metabolic syndrome: Secondary | ICD-10-CM | POA: Diagnosis not present

## 2021-02-08 DIAGNOSIS — E559 Vitamin D deficiency, unspecified: Secondary | ICD-10-CM

## 2021-02-08 MED ORDER — WEGOVY 1.7 MG/0.75ML ~~LOC~~ SOAJ
1.7000 mg | SUBCUTANEOUS | 0 refills | Status: DC
  Filled 2021-02-08 – 2021-02-18 (×2): qty 3, 28d supply, fill #0

## 2021-02-08 NOTE — Progress Notes (Signed)
Chief Complaint:   OBESITY Albert [name] is here to discuss his progress with his obesity treatment plan along with follow-up of his obesity related diagnoses. Albert [name] is on keeping a food journal and adhering to recommended goals of 1900-2000 calories and 100 grams of protein and states he is following his eating plan approximately 50% of the time. Albert [name] states he is doing cardio and resistance for 75 minutes 5 times per week.  Today's visit was #: 25 Starting weight: 236 lbs Starting date: 05/28/2019 Today's weight: 209 lbs Today's date: 02/08/2021 Total lbs lost to date: 27 lbs Total lbs lost since last in-office visit: 0  Interim History: Albert [name] is working at night 2 days a week, 8:30 am -4 am, which has thrown off his sleep schedule. He has not been journaling the last few weeks. He states he has been busy and stressed out.  Subjective:   1. Vitamin D deficiency Albert [name] is currently on OTC Vitamin D and tolerating it well.  2. Insulin resistance Albert [name]'s hunger is controlled on Wegovy.  3. At risk for diabetes mellitus Albert [name] is at risk for dabetes mellitus due to insulin resistance.  Assessment/Plan:   1. Vitamin D deficiency Low Vitamin D level contributes to fatigue and are associated with obesity, breast, and colon cancer. Albert [name] agrees to continue OTC  Vitamin D 10,000 IU every week and he will follow-up for routine testing of Vitamin D, at least 2-3 times per year to avoid over-replacement.   2. Insulin resistance Albert [name] will continue with medications and work on weight loss, exercise, and decreasing simple carbohydrates to help decrease the risk of diabetes. Albert [name] agreed to follow-up with [name] as directed to closely monitor his progress.  - Semaglutide-Weight Management (WEGOVY) 1.7 MG/0.75ML SOAJ; Inject 1.7 mg into the skin once a week.  Dispense: 3 mL; Refill: 0  3. At risk for diabetes mellitus Albert [name] was given approximately 15 minutes of diabetes education and  counseling today. We discussed intensive lifestyle modifications today with an emphasis on weight loss as well as increasing exercise and decreasing simple carbohydrates in his diet. We also reviewed medication options with an emphasis on risk versus benefit of those discussed.   Repetitive spaced learning was employed today to elicit superior memory formation and behavioral change.   4. Obesity with current BMI of 31.79 Albert [name] is currently in the action stage of change. As such, his goal is to continue with weight loss efforts. He has agreed to keeping a food journal and adhering to recommended goals of 1900-2000 calories and 115 grams of protein daily.   Exercise goals:  As is.  Behavioral modification strategies: no skipping meals and meal planning and cooking strategies.  Albert [name] has agreed to follow-up with our clinic in 3-4 weeks. He was informed of the importance of frequent follow-up visits to maximize his success with intensive lifestyle modifications for his multiple health conditions.   Objective:   Blood pressure 125/77, pulse 79, temperature 98.1 F (36.7 C), height 5\' 8"  (1.727 m), weight 209 lb (94.8 kg), SpO2 96 %. Body mass index is 31.78 kg/m.  General: Cooperative, alert, well developed, in no acute distress. HEENT: Conjunctivae and lids unremarkable. Cardiovascular: Regular rhythm.  Lungs: Normal work of breathing. Neurologic: No focal deficits.   Lab Results  Component Value Date   CREATININE 1.18 11/03/2020   BUN 13 11/03/2020   NA 140 11/03/2020   K 4.3 11/03/2020   CL 105 11/03/2020   CO2 27 11/03/2020  Lab Results  Component Value Date   ALT 45 11/03/2020   AST 32 11/03/2020   ALKPHOS 50 11/03/2020   BILITOT 0.5 11/03/2020   Lab Results  Component Value Date   HGBA1C 5.0 02/03/2020   HGBA1C 4.8 05/28/2019   Lab Results  Component Value Date   INSULIN 6.2 02/03/2020   INSULIN 19.0 05/28/2019   Lab Results  Component Value Date   TSH 0.346  (L) 02/03/2020   Lab Results  Component Value Date   CHOL 127 11/03/2020   HDL 47.70 11/03/2020   LDLCALC 63 11/03/2020   TRIG 80.0 11/03/2020   CHOLHDL 3 11/03/2020   Lab Results  Component Value Date   VD25OH 65.6 02/03/2020   VD25OH 49.7 05/28/2019   VD25OH 47.23 05/29/2018   Lab Results  Component Value Date   WBC 5.0 11/03/2020   HGB 15.2 11/03/2020   HCT 45.2 11/03/2020   MCV 88.4 11/03/2020   PLT 219.0 11/03/2020   No results found for: IRON, TIBC, FERRITIN  Attestation Statements:   Reviewed by clinician on day of visit: allergies, medications, problem list, medical history, surgical history, family history, social history, and previous encounter notes.  I, Tonye Pearson, am acting as Location manager for [employer], PA-C.   I have reviewed the above documentation for accuracy and completeness, and I agree with the above. Abby Potash, PA-C

## 2021-02-16 DIAGNOSIS — E039 Hypothyroidism, unspecified: Secondary | ICD-10-CM | POA: Diagnosis not present

## 2021-02-18 ENCOUNTER — Other Ambulatory Visit (HOSPITAL_BASED_OUTPATIENT_CLINIC_OR_DEPARTMENT_OTHER): Payer: Self-pay

## 2021-02-23 ENCOUNTER — Other Ambulatory Visit (HOSPITAL_BASED_OUTPATIENT_CLINIC_OR_DEPARTMENT_OTHER): Payer: Self-pay

## 2021-02-23 MED ORDER — LEVOTHYROXINE SODIUM 25 MCG PO TABS
ORAL_TABLET | ORAL | 4 refills | Status: DC
  Filled 2021-02-23: qty 30, 35d supply, fill #0
  Filled 2021-03-30: qty 25, 30d supply, fill #1
  Filled 2021-04-26: qty 25, 30d supply, fill #2
  Filled 2021-06-17: qty 25, 30d supply, fill #3
  Filled 2021-07-15: qty 25, 30d supply, fill #4
  Filled 2021-08-05: qty 20, 20d supply, fill #5
  Filled 2021-08-09: qty 20, 25d supply, fill #5

## 2021-02-24 ENCOUNTER — Other Ambulatory Visit (HOSPITAL_BASED_OUTPATIENT_CLINIC_OR_DEPARTMENT_OTHER): Payer: Self-pay

## 2021-02-25 ENCOUNTER — Other Ambulatory Visit (HOSPITAL_BASED_OUTPATIENT_CLINIC_OR_DEPARTMENT_OTHER): Payer: Self-pay

## 2021-03-01 ENCOUNTER — Other Ambulatory Visit: Payer: Self-pay

## 2021-03-01 ENCOUNTER — Encounter (INDEPENDENT_AMBULATORY_CARE_PROVIDER_SITE_OTHER): Payer: Self-pay | Admitting: Family Medicine

## 2021-03-01 ENCOUNTER — Other Ambulatory Visit (HOSPITAL_BASED_OUTPATIENT_CLINIC_OR_DEPARTMENT_OTHER): Payer: Self-pay

## 2021-03-01 ENCOUNTER — Ambulatory Visit (INDEPENDENT_AMBULATORY_CARE_PROVIDER_SITE_OTHER): Payer: 59 | Admitting: Family Medicine

## 2021-03-01 VITALS — BP 98/63 | HR 87 | Temp 98.0°F | Ht 68.0 in | Wt 205.0 lb

## 2021-03-01 DIAGNOSIS — F339 Major depressive disorder, recurrent, unspecified: Secondary | ICD-10-CM | POA: Diagnosis not present

## 2021-03-01 DIAGNOSIS — E8881 Metabolic syndrome: Secondary | ICD-10-CM

## 2021-03-01 DIAGNOSIS — Z6833 Body mass index (BMI) 33.0-33.9, adult: Secondary | ICD-10-CM | POA: Diagnosis not present

## 2021-03-01 DIAGNOSIS — E669 Obesity, unspecified: Secondary | ICD-10-CM | POA: Diagnosis not present

## 2021-03-01 MED ORDER — WEGOVY 1.7 MG/0.75ML ~~LOC~~ SOAJ
1.7000 mg | SUBCUTANEOUS | 0 refills | Status: DC
  Filled 2021-03-01: qty 3, 28d supply, fill #0

## 2021-03-01 NOTE — Progress Notes (Signed)
Chief Complaint:   OBESITY Albert [name] is here to discuss his progress with his obesity treatment plan along with follow-up of his obesity related diagnoses. Albert [name] is on keeping a food journal and adhering to recommended goals of 1900-2000 calories and 115 grams of protein and states he is following his eating plan approximately 75% of the time. Albert [name] states he is doing cardio and weight training for 60-90 minutes 5 times per week.  Today's visit was #: 21 Starting weight: 236 lbs Starting date: 05/28/2019 Today's weight: 205 lbs Today's date: 03/01/2021 Total lbs lost to date: 31 lbs Total lbs lost since last in-office visit: 4 lbs  Interim History: Albert [name] is cooking more at home- he cooks protein and vegetables.  He is journaling 5 days per week. He meets the protein goal most days. (100-112 grams/day). He recently has a med school interview with [facility] which he says went very well.  His goal is to get down to 15% body fat.   Subjective:   1. Insulin resistance Albert [name] feels his appetite and cravings are well controlled.  Lab Results  Component Value Date   INSULIN 6.2 02/03/2020   INSULIN 19.0 05/28/2019   Lab Results  Component Value Date   HGBA1C 5.0 02/03/2020    2. Recurrent major depressive disorder, remission status unspecified ([place]) Albert [name]'s mood is stable on psychiatric medication. He sees a Teacher, music and counselor regularly.  Assessment/Plan:   1. Insulin resistance  We will refill Wegovy 1.7 mg weekly with no refills. [name] agreed to follow-up with [name] as directed to closely monitor his progress.  - Semaglutide-Weight Management (WEGOVY) 1.7 MG/0.75ML SOAJ; Inject 1.7 mg into the skin once a week.  Dispense: 3 mL; Refill: 0  2. Recurrent major depressive disorder, remission status unspecified (Albert [name])   [name] will continue all medications. Follow up with psychiatrist and counselor as directed.   3. Obesity with current BMI of 31.79 Albert [name] is currently in  the action stage of change. As such, his goal is to continue with weight loss efforts. He has agreed to keeping a food journal and adhering to recommended goals of 1900-2000 calories and 115 grams of protein daily.   Albert [name] will continue to journal 5 days per week.  Exercise goals:  As is.  Behavioral modification strategies: planning for success.  Albert [name] has agreed to follow-up with our clinic in 3-4 weeks.  Objective:   Blood pressure 98/63, pulse 87, temperature 98 F (36.7 C), height 5\' 8"  (1.727 m), weight 205 lb (93 kg), SpO2 98 %. Body mass index is 31.17 kg/m.  General: Cooperative, alert, well developed, in no acute distress. HEENT: Conjunctivae and lids unremarkable. Cardiovascular: Regular rhythm.  Lungs: Normal work of breathing. Neurologic: No focal deficits.   Lab Results  Component Value Date   CREATININE 1.18 11/03/2020   BUN 13 11/03/2020   NA 140 11/03/2020   K 4.3 11/03/2020   CL 105 11/03/2020   CO2 27 11/03/2020   Lab Results  Component Value Date   ALT 45 11/03/2020   AST 32 11/03/2020   ALKPHOS 50 11/03/2020   BILITOT 0.5 11/03/2020   Lab Results  Component Value Date   HGBA1C 5.0 02/03/2020   HGBA1C 4.8 05/28/2019   Lab Results  Component Value Date   INSULIN 6.2 02/03/2020   INSULIN 19.0 05/28/2019   Lab Results  Component Value Date   TSH 0.346 (L) 02/03/2020   Lab Results  Component Value Date   CHOL 127 11/03/2020  HDL 47.70 11/03/2020   LDLCALC 63 11/03/2020   TRIG 80.0 11/03/2020   CHOLHDL 3 11/03/2020   Lab Results  Component Value Date   VD25OH 65.6 02/03/2020   VD25OH 49.7 05/28/2019   VD25OH 47.23 05/29/2018   Lab Results  Component Value Date   WBC 5.0 11/03/2020   HGB 15.2 11/03/2020   HCT 45.2 11/03/2020   MCV 88.4 11/03/2020   PLT 219.0 11/03/2020   No results found for: IRON, TIBC, FERRITIN  Attestation Statements:   Reviewed by clinician on day of visit: allergies, medications, problem list, medical  history, surgical history, family history, social history, and previous encounter notes.  I, Lizbeth Bark, RMA, am acting as Location manager for [employer], [place].   I have reviewed the above documentation for accuracy and completeness, and I agree with the above. -  Georgianne Fick, FNP

## 2021-03-04 ENCOUNTER — Other Ambulatory Visit (HOSPITAL_BASED_OUTPATIENT_CLINIC_OR_DEPARTMENT_OTHER): Payer: Self-pay

## 2021-03-05 ENCOUNTER — Other Ambulatory Visit (HOSPITAL_BASED_OUTPATIENT_CLINIC_OR_DEPARTMENT_OTHER): Payer: Self-pay

## 2021-03-22 ENCOUNTER — Encounter (INDEPENDENT_AMBULATORY_CARE_PROVIDER_SITE_OTHER): Payer: Self-pay

## 2021-03-22 ENCOUNTER — Encounter (INDEPENDENT_AMBULATORY_CARE_PROVIDER_SITE_OTHER): Payer: Self-pay | Admitting: Family Medicine

## 2021-03-22 ENCOUNTER — Other Ambulatory Visit (HOSPITAL_BASED_OUTPATIENT_CLINIC_OR_DEPARTMENT_OTHER): Payer: Self-pay

## 2021-03-22 ENCOUNTER — Ambulatory Visit (INDEPENDENT_AMBULATORY_CARE_PROVIDER_SITE_OTHER): Payer: 59 | Admitting: Family Medicine

## 2021-03-22 ENCOUNTER — Other Ambulatory Visit: Payer: Self-pay

## 2021-03-22 VITALS — BP 105/69 | HR 78 | Temp 97.4°F | Ht 68.0 in | Wt 210.0 lb

## 2021-03-22 DIAGNOSIS — Z6833 Body mass index (BMI) 33.0-33.9, adult: Secondary | ICD-10-CM

## 2021-03-22 DIAGNOSIS — E8881 Metabolic syndrome: Secondary | ICD-10-CM

## 2021-03-22 DIAGNOSIS — E559 Vitamin D deficiency, unspecified: Secondary | ICD-10-CM | POA: Diagnosis not present

## 2021-03-22 DIAGNOSIS — E669 Obesity, unspecified: Secondary | ICD-10-CM | POA: Diagnosis not present

## 2021-03-22 MED ORDER — WEGOVY 2.4 MG/0.75ML ~~LOC~~ SOAJ
2.4000 mg | SUBCUTANEOUS | 0 refills | Status: DC
  Filled 2021-03-22: qty 3, 28d supply, fill #0

## 2021-03-22 MED ORDER — VITAMIN D 125 MCG (5000 UT) PO CAPS: 1.0000 | ORAL_CAPSULE | Freq: Every day | ORAL | 0 refills | Status: DC

## 2021-03-22 NOTE — Progress Notes (Signed)
Chief Complaint:   OBESITY Albert [name] is here to discuss his progress with his obesity treatment plan along with follow-up of his obesity related diagnoses. Albert [name] is on keeping a food journal and adhering to recommended goals of 1900-2000 calories and 115 grams of protein and states he is following his eating plan approximately 50% of the time. Albert [name] states he is doing weights, cardio, treadmill and running for  75 minutes 5 times per week.  Today's visit was #: 75 Starting weight: 236 lbs Starting date: 05/28/2019 Today's weight: 210 lbs Today's date: 03/22/2021 Total lbs lost to date: 26 lbs Total lbs lost since last in-office visit: 0  Interim History: Albert [name] just got back from vacation a week ago and was off plan. He is now back on plan over the past week. He has not been journaling consistently.   Subjective:   1. Insulin resistance [place] notes his appetite is fairly well controlled. He is on 1.7 mg of Wegovy.   Lab Results  Component Value Date   INSULIN 6.2 02/03/2020   INSULIN 19.0 05/28/2019   Lab Results  Component Value Date   HGBA1C 5.0 02/03/2020    2. Vitamin D deficiency Albert [name]'s Vitamin D is at goal 65.6. He is on daily OTC Vitamin D 5000 IU.  Lab Results  Component Value Date   VD25OH 65.6 02/03/2020   VD25OH 49.7 05/28/2019   VD25OH 47.23 05/29/2018    Assessment/Plan:   1. Insulin resistance Albert [name] will increase dose Wegovy 2.4 mg weekly.   - Semaglutide-Weight Management (WEGOVY) 2.4 MG/0.75ML SOAJ; Inject 2.4 mg into the skin once a week.  Dispense: 3 mL; Refill: 0  2. Vitamin D deficiency  Lum agrees to continue to take OTC Vitamin D 5,000 IU daily and she will follow-up for routine testing of Vitamin D, at least 2-3 times per year to avoid over-replacement. We will check labs today.   - VITAMIN D 25 Hydroxy (Vit-D Deficiency, Fractures) - Cholecalciferol (VITAMIN D) 125 MCG (5000 UT) CAPS; Take 1 capsule by mouth daily.  Dispense: 30  capsule; Refill: 0  3. Obesity with current BMI of 31.94 Albert [name] is currently in the action stage of change. As such, his goal is to continue with weight loss efforts. He has agreed to keeping a food journal and adhering to recommended goals of 1900-2000 calories and 115 grams of protein daily.   Exercise goals:  As is.  Behavioral modification strategies: increasing lean protein intake, decreasing simple carbohydrates, and keeping a strict food journal.  [name] has agreed to follow-up with our clinic in 3-4 weeks.  [name] was informed we would discuss his lab results at his next visit unless there is a critical issue that needs to be addressed sooner. [name] agreed to keep his next visit at the agreed upon time to discuss these results.  Objective:   Blood pressure 105/69, pulse 78, temperature (!) 97.4 F (36.3 C), height 5\' 8"  (1.727 m), weight 210 lb (95.3 kg), SpO2 98 %. Body mass index is 31.93 kg/m.  General: Cooperative, alert, well developed, in no acute distress. HEENT: Conjunctivae and lids unremarkable. Cardiovascular: Regular rhythm.  Lungs: Normal work of breathing. Neurologic: No focal deficits.   Lab Results  Component Value Date   CREATININE 1.18 11/03/2020   BUN 13 11/03/2020   NA 140 11/03/2020   K 4.3 11/03/2020   CL 105 11/03/2020   CO2 27 11/03/2020   Lab Results  Component Value Date   ALT 45  11/03/2020   AST 32 11/03/2020   ALKPHOS 50 11/03/2020   BILITOT 0.5 11/03/2020   Lab Results  Component Value Date   HGBA1C 5.0 02/03/2020   HGBA1C 4.8 05/28/2019   Lab Results  Component Value Date   INSULIN 6.2 02/03/2020   INSULIN 19.0 05/28/2019   Lab Results  Component Value Date   TSH 0.346 (L) 02/03/2020   Lab Results  Component Value Date   CHOL 127 11/03/2020   HDL 47.70 11/03/2020   LDLCALC 63 11/03/2020   TRIG 80.0 11/03/2020   CHOLHDL 3 11/03/2020   Lab Results  Component Value Date   VD25OH 65.6 02/03/2020   VD25OH 49.7  05/28/2019   VD25OH 47.23 05/29/2018   Lab Results  Component Value Date   WBC 5.0 11/03/2020   HGB 15.2 11/03/2020   HCT 45.2 11/03/2020   MCV 88.4 11/03/2020   PLT 219.0 11/03/2020   No results found for: IRON, TIBC, FERRITIN  Attestation Statements:   Reviewed by clinician on day of visit: allergies, medications, problem list, medical history, surgical history, family history, social history, and previous encounter notes.  I, Lizbeth Bark, RMA, am acting as Location manager for [employer], [place].   I have reviewed the above documentation for accuracy and completeness, and I agree with the above. -  Georgianne Fick, FNP

## 2021-03-23 ENCOUNTER — Ambulatory Visit (INDEPENDENT_AMBULATORY_CARE_PROVIDER_SITE_OTHER): Payer: 59 | Admitting: Family

## 2021-03-23 VITALS — BP 113/59 | HR 68 | Temp 98.5°F | Resp 16 | Wt 216.0 lb

## 2021-03-23 DIAGNOSIS — B349 Viral infection, unspecified: Secondary | ICD-10-CM | POA: Diagnosis not present

## 2021-03-23 DIAGNOSIS — R0981 Nasal congestion: Secondary | ICD-10-CM

## 2021-03-23 LAB — VITAMIN D 25 HYDROXY (VIT D DEFICIENCY, FRACTURES): Vit D, 25-Hydroxy: 97.9 ng/mL (ref 30.0–100.0)

## 2021-03-23 LAB — POCT INFLUENZA A/B
Influenza A, POC: NEGATIVE
Influenza B, POC: NEGATIVE

## 2021-03-23 NOTE — Patient Instructions (Signed)
Stay well hydrated and get plenty of rest. For pain/fever you may use tylenol or motrin as needed. Call if new/worsening symptoms or if symptoms are not improved in 3-4 days.

## 2021-03-23 NOTE — Progress Notes (Signed)
Subjective:   By signing my name below, I, [name] Barr-McArthur, attest that this documentation has been prepared under the direction and in the presence of Debbrah Alar, NP, 03/23/2021   Patient ID: Albert [name], male    DOB: [birth year], 25 y.o.   MRN: [id number]  Chief Complaint  Patient presents with   Generalized Body Aches    Complains of body aches   Fatigue    Complains of feeling fatigue   Nasal Congestion    HPI  Patient is in today for an office visit.  Symptoms: His symptoms began last night and he got very little sleep. He woke up with body aches, fatigue, and nasal congestion. He tested for Covid-19 this afternoon at home and it was negative.  He notes that his sister and mother had the flu about 2 weeks ago.    Health Maintenance Due  Topic Date Due   COVID-19 Vaccine (4 - Booster for Pfizer series) 07/08/2020   INFLUENZA VACCINE  12/21/2020    Past Medical History:  Diagnosis Date   Anxiety with depression 09/30/2016   Bipolar disorder (HCC)    Constipation    History of PSVT (paroxysmal supraventricular tachycardia)    Hyperhidrosis    IBS (irritable bowel syndrome)    Joint pain    Low back pain    Major depression, melancholic type    Schizophrenia ([place])    Thyroid disorder    Vitamin D deficiency     Past Surgical History:  Procedure Laterality Date   HAND RECONSTRUCTION Right 11/2017   WISDOM TOOTH EXTRACTION  11/2015    Family History  Problem Relation Age of Onset   Anxiety disorder Mother    Depression Mother    Sudden death Neg Hx    Heart attack Neg Hx     Social History   Socioeconomic History   Marital status: Single    Spouse name: Not on file   Number of children: Not on file   Years of education: Not on file   Highest education level: Not on file  Occupational History   Not on file  Tobacco Use   Smoking status: Never   Smokeless tobacco: Never  Substance and Sexual Activity   Alcohol use: No   Drug use:  No   Sexual activity: Not on file  Other Topics Concern   Not on file  Social History Narrative   Not on file   Social Determinants of Health   Financial Resource Strain: Not on file  Food Insecurity: Not on file  Transportation Needs: Not on file  Physical Activity: Not on file  Stress: Not on file  Social Connections: Not on file  Intimate Partner Violence: Not on file    Outpatient Medications Prior to Visit  Medication Sig Dispense Refill   chlordiazePOXIDE (LIBRIUM) 10 MG capsule Take 20 mg by mouth daily.     Cholecalciferol (VITAMIN D) 125 MCG (5000 UT) CAPS Take 1 capsule by mouth daily. 30 capsule 0   Levomefolate Glucosamine (METHYLFOLATE) 400 MCG CAPS Take by mouth.     levothyroxine (SYNTHROID) 25 MCG tablet Take 1 tablet by mouth in the morning on an empty stomach 6 days per week 30 tablet 4   linaclotide (LINZESS) 290 MCG CAPS capsule TAKE 1 CAPSULE (290 MCG TOTAL) BY MOUTH DAILY BEFORE BREAKFAST. 30 capsule 2   liothyronine (CYTOMEL) 5 MCG tablet Take 1 tablet by mouth twice daily 60 tablet 6   oxybutynin (DITROPAN) 5 MG  tablet Take 1 tablet by mouth twice a day 60 tablet 5   phenelzine (NARDIL) 15 MG tablet Take 2 tablets (30 mg total) by mouth 3 (three) times daily 540 tablet 0   pramipexole (MIRAPEX) 1 MG tablet Take 1/2 to 1 tablet by mouth at bedtime 30 tablet 2   pyridoxine (B-6) 100 MG tablet Take 100 mg by mouth daily.     Semaglutide-Weight Management (WEGOVY) 2.4 MG/0.75ML SOAJ Inject 2.4 mg into the skin once a week. 3 mL 0   No facility-administered medications prior to visit.    Allergies  Allergen Reactions   Sulfa Antibiotics     Family history of severe reactions    Review of Systems  Constitutional:  Positive for malaise/fatigue.  HENT:  Positive for congestion.   Musculoskeletal:  Positive for joint pain and myalgias.      Objective:    Physical Exam Constitutional:      General: He is not in acute distress.    Appearance: Normal  appearance. He is not ill-appearing.  HENT:     Head: Normocephalic and atraumatic.     Right Ear: External ear normal.     Left Ear: External ear normal.  Eyes:     Extraocular Movements: Extraocular movements intact.     Pupils: Pupils are equal, round, and reactive to light.     Comments: (-) nystagmus   Cardiovascular:     Rate and Rhythm: Normal rate and regular rhythm.     Heart sounds: Normal heart sounds. No murmur heard.   No gallop.  Pulmonary:     Effort: Pulmonary effort is normal. No respiratory distress.     Breath sounds: Normal breath sounds. No wheezing or rales.  Lymphadenopathy:     Cervical: No cervical adenopathy.  Skin:    General: Skin is warm and dry.  Neurological:     Mental Status: He is alert and oriented to person, place, and time.  Psychiatric:        Behavior: Behavior normal.        Judgment: Judgment normal.    BP (!) 113/59 (BP Location: Right Arm, Patient Position: Sitting, Cuff Size: Large)   Pulse 68   Temp 98.5 F (36.9 C) (Oral)   Resp 16   Wt 216 lb (98 kg)   SpO2 99%   BMI 32.84 kg/m  Wt Readings from Last 3 Encounters:  03/23/21 216 lb (98 kg)  03/22/21 210 lb (95.3 kg)  03/01/21 205 lb (93 kg)       Assessment & Plan:   Problem List Items Addressed This Visit       Unprioritized   Viral illness - Primary    Rapid flu test is negative.  Offered to send a covid-19 PCR test but he declined. Pt is advised as follows:  Stay well hydrated and get plenty of rest. For pain/fever you may use tylenol or motrin as needed. Call if new/worsening symptoms or if symptoms are not improved in 3-4 days.   Patient verbalizes understanding.         Other Visit Diagnoses     Nasal congestion       Relevant Orders   POCT Influenza A/B (Completed)      No orders of the defined types were placed in this encounter.   I, Debbrah Alar, NP, personally preformed the services described in this documentation.  All medical  record entries made by the scribe were at my direction and in my presence.  I have reviewed the chart and discharge instructions (if applicable) and agree that the record reflects my personal performance and is accurate and complete. 03/23/2021  I,[name] Barr-McArthur,acting as a Education administrator for Nance Pear, NP.,have documented all relevant documentation on the behalf of Nance Pear, NP,as directed by  Nance Pear, NP while in the presence of Nance Pear, NP.  Nance Pear, NP

## 2021-03-23 NOTE — Assessment & Plan Note (Signed)
Rapid flu test is negative.  Offered to send a covid-19 PCR test but he declined. Pt is advised as follows:  Stay well hydrated and get plenty of rest. For pain/fever you may use tylenol or motrin as needed. Call if new/worsening symptoms or if symptoms are not improved in 3-4 days.   Patient verbalizes understanding.

## 2021-03-26 ENCOUNTER — Other Ambulatory Visit (HOSPITAL_BASED_OUTPATIENT_CLINIC_OR_DEPARTMENT_OTHER): Payer: Self-pay

## 2021-03-26 DIAGNOSIS — R5382 Chronic fatigue, unspecified: Secondary | ICD-10-CM | POA: Diagnosis not present

## 2021-03-26 DIAGNOSIS — F411 Generalized anxiety disorder: Secondary | ICD-10-CM | POA: Diagnosis not present

## 2021-03-26 DIAGNOSIS — F3341 Major depressive disorder, recurrent, in partial remission: Secondary | ICD-10-CM | POA: Diagnosis not present

## 2021-03-26 MED ORDER — PHENELZINE SULFATE 15 MG PO TABS
ORAL_TABLET | ORAL | 2 refills | Status: DC
  Filled 2021-03-26: qty 240, 30d supply, fill #0
  Filled 2021-04-26: qty 240, 30d supply, fill #1
  Filled 2021-05-21: qty 240, 30d supply, fill #2

## 2021-03-26 MED ORDER — CHLORDIAZEPOXIDE HCL 10 MG PO CAPS
ORAL_CAPSULE | ORAL | 0 refills | Status: DC
  Filled 2021-03-26: qty 90, 90d supply, fill #0

## 2021-03-30 ENCOUNTER — Other Ambulatory Visit (HOSPITAL_BASED_OUTPATIENT_CLINIC_OR_DEPARTMENT_OTHER): Payer: Self-pay

## 2021-04-13 ENCOUNTER — Ambulatory Visit (INDEPENDENT_AMBULATORY_CARE_PROVIDER_SITE_OTHER): Payer: 59 | Admitting: Family Medicine

## 2021-04-13 ENCOUNTER — Other Ambulatory Visit: Payer: Self-pay

## 2021-04-13 ENCOUNTER — Other Ambulatory Visit (HOSPITAL_BASED_OUTPATIENT_CLINIC_OR_DEPARTMENT_OTHER): Payer: Self-pay

## 2021-04-13 ENCOUNTER — Encounter (INDEPENDENT_AMBULATORY_CARE_PROVIDER_SITE_OTHER): Payer: Self-pay | Admitting: Family Medicine

## 2021-04-13 VITALS — BP 102/66 | HR 90 | Temp 97.9°F | Ht 68.0 in | Wt 207.0 lb

## 2021-04-13 DIAGNOSIS — Z6833 Body mass index (BMI) 33.0-33.9, adult: Secondary | ICD-10-CM | POA: Diagnosis not present

## 2021-04-13 DIAGNOSIS — E8881 Metabolic syndrome: Secondary | ICD-10-CM | POA: Diagnosis not present

## 2021-04-13 DIAGNOSIS — F339 Major depressive disorder, recurrent, unspecified: Secondary | ICD-10-CM | POA: Diagnosis not present

## 2021-04-13 DIAGNOSIS — E669 Obesity, unspecified: Secondary | ICD-10-CM | POA: Diagnosis not present

## 2021-04-13 MED ORDER — WEGOVY 2.4 MG/0.75ML ~~LOC~~ SOAJ
2.4000 mg | SUBCUTANEOUS | 0 refills | Status: DC
  Filled 2021-04-13: qty 3, 28d supply, fill #0

## 2021-04-13 NOTE — Progress Notes (Signed)
Chief Complaint:   OBESITY Albert [name] is here to discuss his progress with his obesity treatment plan along with follow-up of his obesity related diagnoses. Albert [name] is on keeping a food journal and adhering to recommended goals of 1900-2000 calories and 115 grams of protein and states he is following his eating plan approximately 60% of the time. Albert [name] states he is doing cardio, resistance and gym exercise for 90 minutes 5 times per week.  Today's visit was #: 28 Starting weight: 236 lbs Starting date: 05/28/2019 Today's weight: 207 lbs Today's date: 04/13/2021 Total lbs lost to date: 29 lbs Total lbs lost since last in-office visit: 3 lbs  Interim History: Albert [name]  has cut out alcohol intake recently which is an accomplishment because he is a Animator in bar. 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 He works out regularly. His goal is to reduce his body fat to 15%.  He is journaling inconsistently. He averages 100 grams of protein. He does a great job with water intake.   Subjective:   1. Insulin resistance Albert [name]'s appetite is well controlled. He is on Wegovy 2.4 mg weekly.   Lab Results  Component Value Date   INSULIN 6.2 02/03/2020   INSULIN 19.0 05/28/2019   Lab Results  Component Value Date   HGBA1C 5.0 02/03/2020    2. Recurrent major depressive disorder, remission status unspecified (Albert [name]) Albert [name] sees a counselor every 2 weeks and psychiatrist every 6 weeks. He says his mood is currently fairly stable overall.  Assessment/Plan:   1. Insulin resistance  We will refill Wegovy 2.4 mg. Albert [name] agreed to follow-up with [name] as directed to closely monitor his progress.  - Semaglutide-Weight Management (WEGOVY) 2.4 MG/0.75ML SOAJ; Inject 2.4 mg into the skin once a week.  Dispense: 3 mL; Refill: 0  2. Recurrent major depressive disorder, remission status unspecified (Albert [name]) Albert [name] will continue all medications. He will follow up with therapist and psychiatrist. I congratulated  him on stopping alcohol intake. He is aware that drinking alcohol with his psychotropic meds is not a good practice and could lead to serious interactions.     3. Obesity with current BMI of 31.48 Albert [name] is currently in the action stage of change. As such, his goal is to continue with weight loss efforts. He has agreed to keeping a food journal and adhering to recommended goals of 1900-2000 calories and 115 grams of protein daily.  Albert [name] will increase frequency of journaling to 4-5 days per week and increase protein to 115 grams per day.  Exercise goals:  As is.  Behavioral modification strategies: increasing lean protein intake, holiday eating strategies , and keeping a strict food journal.  Albert [name] has agreed to follow-up with our clinic in 3-4 weeks.  Objective:   Blood pressure 102/66, pulse 90, temperature 97.9 F (36.6 C), height 5\' 8"  (1.727 m), weight 207 lb (93.9 kg), SpO2 97 %. Body mass index is 31.47 kg/m.  General: Cooperative, alert, well developed, in no acute distress. HEENT: Conjunctivae and lids unremarkable. Cardiovascular: Regular rhythm.  Lungs: Normal work of breathing. Neurologic: No focal deficits.   Lab Results  Component Value Date   CREATININE 1.18 11/03/2020   BUN 13 11/03/2020   NA 140 11/03/2020   K 4.3 11/03/2020   CL 105 11/03/2020   CO2 27 11/03/2020   Lab Results  Component Value Date   ALT 45 11/03/2020   AST 32 11/03/2020   ALKPHOS 50 11/03/2020   BILITOT 0.5  11/03/2020   Lab Results  Component Value Date   HGBA1C 5.0 02/03/2020   HGBA1C 4.8 05/28/2019   Lab Results  Component Value Date   INSULIN 6.2 02/03/2020   INSULIN 19.0 05/28/2019   Lab Results  Component Value Date   TSH 0.346 (L) 02/03/2020   Lab Results  Component Value Date   CHOL 127 11/03/2020   HDL 47.70 11/03/2020   LDLCALC 63 11/03/2020   TRIG 80.0 11/03/2020   CHOLHDL 3 11/03/2020   Lab Results  Component Value Date   VD25OH 97.9 03/22/2021   VD25OH  65.6 02/03/2020   VD25OH 49.7 05/28/2019   Lab Results  Component Value Date   WBC 5.0 11/03/2020   HGB 15.2 11/03/2020   HCT 45.2 11/03/2020   MCV 88.4 11/03/2020   PLT 219.0 11/03/2020   No results found for: IRON, TIBC, FERRITIN  Attestation Statements:   Reviewed by clinician on day of visit: allergies, medications, problem list, medical history, surgical history, family history, social history, and previous encounter notes.  I, Lizbeth Bark, RMA, am acting as Location manager for [employer], [place].   I have reviewed the above documentation for accuracy and completeness, and I agree with the above. -  Georgianne Fick, FNP

## 2021-04-14 ENCOUNTER — Other Ambulatory Visit (HOSPITAL_BASED_OUTPATIENT_CLINIC_OR_DEPARTMENT_OTHER): Payer: Self-pay

## 2021-04-21 ENCOUNTER — Other Ambulatory Visit (HOSPITAL_BASED_OUTPATIENT_CLINIC_OR_DEPARTMENT_OTHER): Payer: Self-pay

## 2021-04-21 ENCOUNTER — Encounter (HOSPITAL_BASED_OUTPATIENT_CLINIC_OR_DEPARTMENT_OTHER): Payer: Self-pay

## 2021-04-21 ENCOUNTER — Emergency Department (HOSPITAL_BASED_OUTPATIENT_CLINIC_OR_DEPARTMENT_OTHER)
Admission: EM | Admit: 2021-04-21 | Discharge: 2021-04-21 | Disposition: A | Discharge status: Home / Self Care | Payer: 59 | Attending: Emergency Medicine | Admitting: Emergency Medicine

## 2021-04-21 ENCOUNTER — Emergency Department (HOSPITAL_BASED_OUTPATIENT_CLINIC_OR_DEPARTMENT_OTHER): Payer: 59

## 2021-04-21 ENCOUNTER — Other Ambulatory Visit: Payer: Self-pay

## 2021-04-21 DIAGNOSIS — N200 Calculus of kidney: Secondary | ICD-10-CM | POA: Diagnosis not present

## 2021-04-21 DIAGNOSIS — Z79899 Other long term (current) drug therapy: Secondary | ICD-10-CM | POA: Diagnosis not present

## 2021-04-21 DIAGNOSIS — R109 Unspecified abdominal pain: Secondary | ICD-10-CM | POA: Diagnosis present

## 2021-04-21 DIAGNOSIS — I10 Essential (primary) hypertension: Secondary | ICD-10-CM | POA: Diagnosis not present

## 2021-04-21 DIAGNOSIS — E8881 Metabolic syndrome: Secondary | ICD-10-CM | POA: Insufficient documentation

## 2021-04-21 DIAGNOSIS — Z6831 Body mass index (BMI) 31.0-31.9, adult: Secondary | ICD-10-CM | POA: Insufficient documentation

## 2021-04-21 DIAGNOSIS — N132 Hydronephrosis with renal and ureteral calculous obstruction: Secondary | ICD-10-CM | POA: Diagnosis not present

## 2021-04-21 DIAGNOSIS — E039 Hypothyroidism, unspecified: Secondary | ICD-10-CM | POA: Insufficient documentation

## 2021-04-21 LAB — CBC WITH DIFFERENTIAL/PLATELET
Abs Immature Granulocytes: 0.08 10*3/uL — ABNORMAL HIGH (ref 0.00–0.07)
Basophils Absolute: 0.1 10*3/uL (ref 0.0–0.1)
Basophils Relative: 1 %
Eosinophils Absolute: 0 10*3/uL (ref 0.0–0.5)
Eosinophils Relative: 0 %
HCT: 44.1 % (ref 39.0–52.0)
Hemoglobin: 14.9 g/dL (ref 13.0–17.0)
Immature Granulocytes: 1 %
Lymphocytes Relative: 7 %
Lymphs Abs: 0.7 10*3/uL (ref 0.7–4.0)
MCH: 30 pg (ref 26.0–34.0)
MCHC: 33.8 g/dL (ref 30.0–36.0)
MCV: 88.9 fL (ref 80.0–100.0)
Monocytes Absolute: 0.8 10*3/uL (ref 0.1–1.0)
Monocytes Relative: 8 %
Neutro Abs: 8.6 10*3/uL — ABNORMAL HIGH (ref 1.7–7.7)
Neutrophils Relative %: 83 %
Platelets: 209 10*3/uL (ref 150–400)
RBC: 4.96 MIL/uL (ref 4.22–5.81)
RDW: 12.7 % (ref 11.5–15.5)
WBC: 10.3 10*3/uL (ref 4.0–10.5)
nRBC: 0 % (ref 0.0–0.2)

## 2021-04-21 LAB — URINALYSIS, ROUTINE W REFLEX MICROSCOPIC
Bilirubin Urine: NEGATIVE
Glucose, UA: NEGATIVE mg/dL
Ketones, ur: 15 mg/dL — AB
Leukocytes,Ua: NEGATIVE
Nitrite: NEGATIVE
Protein, ur: NEGATIVE mg/dL
Specific Gravity, Urine: 1.025 (ref 1.005–1.030)
pH: 6 (ref 5.0–8.0)

## 2021-04-21 LAB — URINALYSIS, MICROSCOPIC (REFLEX): RBC / HPF: >50 RBC/hpf (ref 0–5)

## 2021-04-21 LAB — COMPREHENSIVE METABOLIC PANEL
ALT: 54 U/L — ABNORMAL HIGH (ref 0–44)
AST: 46 U/L — ABNORMAL HIGH (ref 15–41)
Albumin: 4.3 g/dL (ref 3.5–5.0)
Alkaline Phosphatase: 53 U/L (ref 38–126)
Anion gap: 10 (ref 5–15)
BUN: 19 mg/dL (ref 6–20)
CO2: 22 mmol/L (ref 22–32)
Calcium: 9.3 mg/dL (ref 8.9–10.3)
Chloride: 104 mmol/L (ref 98–111)
Creatinine, Ser: 1.18 mg/dL (ref 0.61–1.24)
GFR, Estimated: >60 mL/min (ref >60)
Glucose, Bld: 95 mg/dL (ref 70–99)
Potassium: 4 mmol/L (ref 3.5–5.1)
Sodium: 136 mmol/L (ref 135–145)
Total Bilirubin: 0.4 mg/dL (ref 0.3–1.2)
Total Protein: 6.8 g/dL (ref 6.5–8.1)

## 2021-04-21 LAB — LIPASE, BLOOD: Lipase: 36 U/L (ref 11–51)

## 2021-04-21 MED ORDER — TAMSULOSIN HCL 0.4 MG PO CAPS
0.4000 mg | ORAL_CAPSULE | Freq: Every day | ORAL | 0 refills | Status: DC
  Filled 2021-04-21: qty 5, 5d supply, fill #0

## 2021-04-21 MED ORDER — HYDROCODONE-ACETAMINOPHEN 5-325 MG PO TABS
1.0000 | ORAL_TABLET | Freq: Four times a day (QID) | ORAL | 0 refills | Status: DC | PRN
  Filled 2021-04-21: qty 10, 3d supply, fill #0

## 2021-04-21 MED ORDER — KETOROLAC TROMETHAMINE 30 MG/ML IJ SOLN
30.0000 mg | Freq: Once | INTRAMUSCULAR | Status: AC
  Administered 2021-04-21: 30 mg via INTRAVENOUS
  Filled 2021-04-21: qty 1

## 2021-04-21 NOTE — ED Triage Notes (Signed)
Pt arrives with reports of flank pain starting about 1 hour PTA. Denies taking anything for pain PTA. Pt states he is having difficulty urinating with some blood in the urine.

## 2021-04-21 NOTE — ED Notes (Signed)
Patient transported to CT 

## 2021-04-21 NOTE — Discharge Instructions (Signed)
You have been diagnosed with a 2.5 mm kidney stone on the right side causing pain.  Please follow instruction below.  Return if you have any concern.  Please be aware opiate pain medication can cause drowsiness so avoid operate heavy machinery or driving while taking pain medication.

## 2021-04-21 NOTE — ED Provider Notes (Signed)
[facility] EMERGENCY DEPARTMENT Provider Note   CSN: [id number] Arrival date & time: 04/21/21  3299     History Chief Complaint  Patient presents with   Flank Pain    Albert [name] is a 25 y.o. male.  The history is provided by the patient. No language interpreter was used.  Flank Pain    25 year old male significant history of bipolar, anxiety, depression, schizophrenia who presents accompanied by mother for evaluation of flank pain.  Patient reported cute onset of right flank pain that started few hours ago.  Pain is sharp stabbing persistent nothing seems to make it better or worse.  Endorses nausea but without vomiting.  Rates pain as 10 out of 10.  He also noticed some difficulty urinating and noticed trace of blood in his urine.  He denies having fever chills nausea vomiting diarrhea constipation denies chest pain shortness of breath.  Denies any recent injury.  Did report family history of kidney stone but he has never had kidney stones in the past.      Past Medical History:  Diagnosis Date   Anxiety with depression 09/30/2016   Bipolar disorder (HCC)    Constipation    History of PSVT (paroxysmal supraventricular tachycardia)    Hyperhidrosis    IBS (irritable bowel syndrome)    Joint pain    Low back pain    Major depression, melancholic type    Schizophrenia ([place])    Thyroid disorder    Vitamin D deficiency     Patient Active Problem List   Diagnosis Date Noted   Viral illness 03/23/2021   Polyphagia 06/30/2020   Essential hypertension 03/26/2020   Other specified hypothyroidism 03/04/2020   Vitamin D deficiency 03/04/2020   Other constipation 08/13/2019   Insulin resistance 06/12/2019   Chronic bilateral low back pain without sciatica 02/20/2019   Class 2 severe obesity with serious comorbidity and body mass index (BMI) of 35.0 to 35.9 in adult [facility]) 12/28/2018   Insomnia 12/04/2017   Acne vulgaris 10/04/2017   Anxiety disorder  12/23/2016   Depression 12/23/2016    Past Surgical History:  Procedure Laterality Date   HAND RECONSTRUCTION Right 11/2017   WISDOM TOOTH EXTRACTION  11/2015       Family History  Problem Relation Age of Onset   Anxiety disorder Mother    Depression Mother    Sudden death Neg Hx    Heart attack Neg Hx     Social History   Tobacco Use   Smoking status: Never   Smokeless tobacco: Never  Vaping Use   Vaping Use: Never used  Substance Use Topics   Alcohol use: No   Drug use: No    Home Medications Prior to Admission medications   Medication Sig Start Date End Date Taking? Authorizing Provider  chlordiazePOXIDE (LIBRIUM) 10 MG capsule Take 20 mg by mouth daily.    [provider]  chlordiazePOXIDE (LIBRIUM) 10 MG capsule Take 1 capsule (10 mg total) by mouth at bedtime as needed for Anxiety 03/26/21     Cholecalciferol (VITAMIN D) 125 MCG (5000 UT) CAPS Take 1 capsule by mouth daily. 03/22/21   [place]mire, Joneen Boers, FNP  Levomefolate Glucosamine (METHYLFOLATE) 400 MCG CAPS Take by mouth.    [provider]  levothyroxine (SYNTHROID) 25 MCG tablet Take 1 tablet by mouth in the morning on an empty stomach 6 days per week 02/23/21     linaclotide (LINZESS) 290 MCG CAPS capsule TAKE 1 CAPSULE (  [place]) BY MOUTH DAILY BEFORE BREAKFAST. 02/01/21 02/01/22  Shelda Pal, DO  liothyronine (CYTOMEL) 5 MCG tablet Take 1 tablet by mouth twice daily 10/22/20     oxybutynin (DITROPAN) 5 MG tablet Take 1 tablet by mouth twice a day 11/24/20     phenelzine (NARDIL) 15 MG tablet Take 2 tablets (30 mg total) by mouth 3 (three) times daily 12/18/20     phenelzine (NARDIL) 15 MG tablet Take 4 tablets (60 mg total) by mouth 2 (two) times daily 03/26/21     pramipexole (MIRAPEX) 1 MG tablet Take 1/2 to 1 tablet by mouth at bedtime 01/26/21     pyridoxine (B-6) 100 MG tablet Take 100 mg by mouth daily.    [provider]  Semaglutide-Weight Management (WEGOVY) 2.4  MG/0.75ML SOAJ Inject 2.4 mg into the skin once a week. 04/13/21   [place], Joneen Boers, FNP    Allergies    Sulfa antibiotics  Review of Systems   Review of Systems  Genitourinary:  Positive for flank pain.  All other systems reviewed and are negative.  Physical Exam Updated Vital Signs BP 121/64   Pulse 70   Temp 97.7 F (36.5 C) (Oral)   Resp 18   Ht 5\' 8"  (1.727 m)   Wt 93 kg   SpO2 100%   BMI 31.17 kg/m   Physical Exam Vitals and nursing note reviewed.  Constitutional:      Appearance: He is well-developed.     Comments: Appears very uncomfortable, writhing in bed  HENT:     Head: Atraumatic.  Eyes:     Conjunctiva/sclera: Conjunctivae normal.  Cardiovascular:     Rate and Rhythm: Normal rate and regular rhythm.     Pulses: Normal pulses.     Heart sounds: Normal heart sounds.  Pulmonary:     Effort: Pulmonary effort is normal.     Breath sounds: Normal breath sounds.  Abdominal:     Palpations: Abdomen is soft.     Tenderness: There is no abdominal tenderness. There is right CVA tenderness. There is no left CVA tenderness.  Musculoskeletal:     Cervical back: Neck supple.  Skin:    Findings: No rash.  Neurological:     Mental Status: He is alert.    ED Results / Procedures / Treatments   Labs (all labs ordered are listed, but only abnormal results are displayed) Labs Reviewed  URINALYSIS, ROUTINE W REFLEX MICROSCOPIC - Abnormal; Notable for the following components:      Result Value   Color, Urine AMBER (*)    APPearance CLOUDY (*)    Hgb urine dipstick LARGE (*)    Ketones, ur 15 (*)    All other components within normal limits  COMPREHENSIVE METABOLIC PANEL - Abnormal; Notable for the following components:   AST 46 (*)    ALT 54 (*)    All other components within normal limits  CBC WITH DIFFERENTIAL/PLATELET - Abnormal; Notable for the following components:   Neutro Abs 8.6 (*)    Abs Immature Granulocytes 0.08 (*)    All other components  within normal limits  URINALYSIS, MICROSCOPIC (REFLEX) - Abnormal; Notable for the following components:   Bacteria, UA FEW (*)    All other components within normal limits  LIPASE, BLOOD  CBC WITH DIFFERENTIAL/PLATELET    EKG None  Radiology CT Renal Stone Study  Result Date: 04/21/2021 CLINICAL DATA:  Flank pain.  Evaluate for kidney stones EXAM: CT ABDOMEN AND  PELVIS WITHOUT CONTRAST TECHNIQUE: Multidetector CT imaging of the abdomen and pelvis was performed following the standard protocol without IV contrast. COMPARISON:  None. FINDINGS: Lower chest: No acute abnormality. Hepatobiliary: No focal liver abnormality is seen. No gallstones, gallbladder wall thickening, or biliary dilatation. Pancreas: Unremarkable. No pancreatic ductal dilatation or surrounding inflammatory changes. Spleen: Normal in size without focal abnormality. Adrenals/Urinary Tract: Normal adrenal glands. Several stones are identified within the upper and lower pole collecting system of the left kidney. These measure up to 3 mm. Punctate stone noted within the inferior pole collecting system of the right kidney. There is mild right hydronephrosis and hydroureter. Within the distal right ureter just before the UVJ there is a stone measuring 2.5 mm, image 80/2. Bladder unremarkable. Stomach/Bowel: Stomach is within normal limits. Appendix appears normal. No evidence of bowel wall thickening, distention, or inflammatory changes. Vascular/Lymphatic: No significant vascular findings are present. No enlarged abdominal or pelvic lymph nodes. Reproductive: Prostate is unremarkable. Other: No free fluid or fluid collections. Musculoskeletal: No acute or significant osseous findings. IMPRESSION: 1. Right-sided hydronephrosis and hydroureter secondary to 2.5 mm distal right ureteral calculus. 2. Bilateral nephrolithiasis. Electronically Signed   By: Kerby Moors M.D.   On: 04/21/2021 11:00    Procedures Procedures   Medications  Ordered in ED Medications  ketorolac (TORADOL) 30 MG/ML injection 30 mg (30 mg Intravenous Given 04/21/21 1031)    ED Course  I have reviewed the triage vital signs and the nursing notes.  Pertinent labs & imaging results that were available during my care of the patient were reviewed by me and considered in my medical decision making (see chart for details).    MDM Rules/Calculators/A&P                           BP 130/89   Pulse 86   Temp 97.7 F (36.5 C) (Oral)   Resp 20   Ht 5\' 8"  (1.727 m)   Wt 93 kg   SpO2 100%   BMI 31.17 kg/m   Final Clinical Impression(s) / ED Diagnoses Final diagnoses:  Kidney stone on right side    Rx / DC Orders ED Discharge Orders          Ordered    HYDROcodone-acetaminophen (NORCO/VICODIN) 5-325 MG tablet  Every 6 hours PRN        04/21/21 1246    tamsulosin (FLOMAX) 0.4 MG CAPS capsule  Daily        04/21/21 1246           10:29 AM Patient here with acute onset of right flank pain suggestive of kidney stone.  He appears very uncomfortable, work-up initiated, will give Toradol.  12:47 PM CT scan demonstrate a 2.5 mm kidney stone at the right UVJ with associated hydronephrosis and hydroureter.  Finding consistent with patient presentation.  Labs are reassuring.  After Toradol patient reported feeling much better.  At this time patient is stable for discharge home with urine strainer, appropriate opiate pain management and outpatient care.  Return precaution given.   Domenic Moras, PA-C 04/21/21 1248    Luna Fuse, MD 04/28/21 [phone]

## 2021-04-21 NOTE — ED Notes (Signed)
Labs recollected and sent 

## 2021-04-22 ENCOUNTER — Encounter (HOSPITAL_BASED_OUTPATIENT_CLINIC_OR_DEPARTMENT_OTHER): Payer: Self-pay

## 2021-04-22 DIAGNOSIS — G4710 Hypersomnia, unspecified: Secondary | ICD-10-CM

## 2021-04-26 ENCOUNTER — Other Ambulatory Visit (HOSPITAL_BASED_OUTPATIENT_CLINIC_OR_DEPARTMENT_OTHER): Payer: Self-pay

## 2021-04-28 DIAGNOSIS — E039 Hypothyroidism, unspecified: Secondary | ICD-10-CM | POA: Diagnosis not present

## 2021-05-07 ENCOUNTER — Other Ambulatory Visit (HOSPITAL_BASED_OUTPATIENT_CLINIC_OR_DEPARTMENT_OTHER): Payer: Self-pay

## 2021-05-07 DIAGNOSIS — R5382 Chronic fatigue, unspecified: Secondary | ICD-10-CM | POA: Diagnosis not present

## 2021-05-07 DIAGNOSIS — F3341 Major depressive disorder, recurrent, in partial remission: Secondary | ICD-10-CM | POA: Diagnosis not present

## 2021-05-07 DIAGNOSIS — F411 Generalized anxiety disorder: Secondary | ICD-10-CM | POA: Diagnosis not present

## 2021-05-07 MED ORDER — ARIPIPRAZOLE 2 MG PO TABS
ORAL_TABLET | ORAL | 1 refills | Status: DC
  Filled 2021-05-07: qty 30, 30d supply, fill #0

## 2021-05-07 MED ORDER — PRAMIPEXOLE DIHYDROCHLORIDE 1 MG PO TABS
ORAL_TABLET | ORAL | 2 refills | Status: DC
  Filled 2021-05-07: qty 30, 30d supply, fill #0
  Filled 2021-07-03: qty 30, 30d supply, fill #1
  Filled 2021-08-05: qty 30, 30d supply, fill #2

## 2021-05-10 ENCOUNTER — Other Ambulatory Visit (INDEPENDENT_AMBULATORY_CARE_PROVIDER_SITE_OTHER): Payer: Self-pay | Admitting: Family Medicine

## 2021-05-10 DIAGNOSIS — E8881 Metabolic syndrome: Secondary | ICD-10-CM

## 2021-05-11 ENCOUNTER — Ambulatory Visit (INDEPENDENT_AMBULATORY_CARE_PROVIDER_SITE_OTHER): Payer: 59 | Admitting: Family Medicine

## 2021-05-11 ENCOUNTER — Other Ambulatory Visit (HOSPITAL_BASED_OUTPATIENT_CLINIC_OR_DEPARTMENT_OTHER): Payer: Self-pay

## 2021-05-11 MED ORDER — WEGOVY 2.4 MG/0.75ML ~~LOC~~ SOAJ
2.4000 mg | SUBCUTANEOUS | 0 refills | Status: DC
  Filled 2021-05-11: qty 3, 28d supply, fill #0

## 2021-05-11 NOTE — Telephone Encounter (Signed)
LAST APPOINTMENT DATE: 04/13/21 NEXT APPOINTMENT DATE: 06/03/21   [facility] [address] [place] [address] Phone: [phone] Fax: [place] [address] [place] [address] Phone: [phone] Fax: [phone]  Patient is requesting a refill of the following medications: Requested Prescriptions   Pending Prescriptions Disp Refills   Semaglutide-Weight Management (WEGOVY) 2.4 MG/0.75ML SOAJ 3 mL 0    Sig: Inject 2.4 mg into the skin once a week.    Date last filled: 04/13/21 Previously prescribed by [facility]  Lab Results  Component Value Date   HGBA1C 5.0 02/03/2020   HGBA1C 4.8 05/28/2019   Lab Results  Component Value Date   LDLCALC 63 11/03/2020   CREATININE 1.18 04/21/2021   Lab Results  Component Value Date   VD25OH 97.9 03/22/2021   VD25OH 65.6 02/03/2020   VD25OH 49.7 05/28/2019    BP Readings from Last 3 Encounters:  04/21/21 (!) 133/91  04/13/21 102/66  03/23/21 (!) 113/59

## 2021-05-11 NOTE — Telephone Encounter (Signed)
Dawn 

## 2021-05-14 DIAGNOSIS — N201 Calculus of ureter: Secondary | ICD-10-CM | POA: Diagnosis not present

## 2021-05-21 ENCOUNTER — Other Ambulatory Visit (HOSPITAL_BASED_OUTPATIENT_CLINIC_OR_DEPARTMENT_OTHER): Payer: Self-pay

## 2021-05-24 ENCOUNTER — Other Ambulatory Visit (HOSPITAL_BASED_OUTPATIENT_CLINIC_OR_DEPARTMENT_OTHER): Payer: Self-pay

## 2021-06-02 ENCOUNTER — Other Ambulatory Visit (HOSPITAL_BASED_OUTPATIENT_CLINIC_OR_DEPARTMENT_OTHER): Payer: Self-pay

## 2021-06-03 ENCOUNTER — Other Ambulatory Visit: Payer: Self-pay

## 2021-06-03 ENCOUNTER — Encounter (INDEPENDENT_AMBULATORY_CARE_PROVIDER_SITE_OTHER): Payer: Self-pay | Admitting: Family Medicine

## 2021-06-03 ENCOUNTER — Other Ambulatory Visit (HOSPITAL_BASED_OUTPATIENT_CLINIC_OR_DEPARTMENT_OTHER): Payer: Self-pay

## 2021-06-03 ENCOUNTER — Ambulatory Visit (HOSPITAL_BASED_OUTPATIENT_CLINIC_OR_DEPARTMENT_OTHER): Payer: 59 | Attending: Psychiatry | Admitting: Internal Medicine

## 2021-06-03 ENCOUNTER — Ambulatory Visit (INDEPENDENT_AMBULATORY_CARE_PROVIDER_SITE_OTHER): Payer: 59 | Admitting: Family Medicine

## 2021-06-03 VITALS — BP 99/67 | HR 71 | Temp 98.2°F | Ht 68.0 in | Wt 205.0 lb

## 2021-06-03 DIAGNOSIS — Z6831 Body mass index (BMI) 31.0-31.9, adult: Secondary | ICD-10-CM | POA: Diagnosis not present

## 2021-06-03 DIAGNOSIS — G479 Sleep disorder, unspecified: Secondary | ICD-10-CM | POA: Diagnosis not present

## 2021-06-03 DIAGNOSIS — R400 Somnolence: Secondary | ICD-10-CM | POA: Insufficient documentation

## 2021-06-03 DIAGNOSIS — R0683 Snoring: Secondary | ICD-10-CM | POA: Diagnosis not present

## 2021-06-03 DIAGNOSIS — K5909 Other constipation: Secondary | ICD-10-CM

## 2021-06-03 DIAGNOSIS — Z79899 Other long term (current) drug therapy: Secondary | ICD-10-CM | POA: Diagnosis not present

## 2021-06-03 DIAGNOSIS — G4710 Hypersomnia, unspecified: Secondary | ICD-10-CM | POA: Diagnosis not present

## 2021-06-03 DIAGNOSIS — E8881 Metabolic syndrome: Secondary | ICD-10-CM

## 2021-06-03 MED ORDER — WEGOVY 2.4 MG/0.75ML ~~LOC~~ SOAJ
2.4000 mg | SUBCUTANEOUS | 0 refills | Status: DC
  Filled 2021-06-03: qty 3, 28d supply, fill #0

## 2021-06-03 NOTE — Progress Notes (Signed)
Chief Complaint:   OBESITY Albert [name] is here to discuss his progress with his obesity treatment plan along with follow-up of his obesity related diagnoses. Albert [name] is on keeping a food journal and adhering to recommended goals of 1900-2000 calories and 115 grams of protein and states he is following his eating plan approximately 50% of the time. Albert [name] states he is gym, weights and jujitsu for 75 minutes 5-6 times per week.  Today's visit was #: 65 Starting weight: 236 lbs Starting date: 05/28/2019 Today's weight: 205 lbs Today's date: 06/03/2021 Total lbs lost to date: 31 lbs Total lbs lost since last in-office visit: 2 lbs  Interim History: Albert [name] has done some introspection recently and realized he may internal reasons to subconsciously sabotage weight loss. He has been journaling more consistently and realized that he eats more calories than he thinks. . His protein averages 100 most days.   Subjective:   1. Insulin resistance Albert [name]'s appetite is well controlled with Wegovy.  Lab Results  Component Value Date   INSULIN 6.2 02/03/2020   INSULIN 19.0 05/28/2019   Lab Results  Component Value Date   HGBA1C 5.0 02/03/2020    2. Other constipation Albert [name] constipation is controlled with Linzess. He only takes this a few days per week. More than that causes diarrhea. He feels like less frequent Linzess improves efficiency of Wegovy.  Assessment/Plan:   1. Insulin resistance We will refill Wegovy 2.4 mg weekly.  - Semaglutide-Weight Management (WEGOVY) 2.4 MG/0.75ML SOAJ; Inject 2.4 mg into the skin once a week.  Dispense: 3 mL; Refill: 0  2. Other constipation Albert [name] will continue Linzess as needed.   3. Obesity: BMI 31.18 Albert [name] is currently in the action stage of change. As such, his goal is to continue with weight loss efforts. He has agreed to keeping a food journal and adhering to recommended goals of 1900-2000 calories and 115 grams of protein daily.   Exercise  goals:  As is.  Behavioral modification strategies: keeping a strict food journal.  Albert [name] has agreed to follow-up with our clinic in 4 weeks.  Objective:   Blood pressure 99/67, pulse 71, temperature 98.2 F (36.8 C), height 5\' 8"  (1.727 m), weight 205 lb (93 kg), SpO2 100 %. Body mass index is 31.17 kg/m.  General: Cooperative, alert, well developed, in no acute distress. HEENT: Conjunctivae and lids unremarkable. Cardiovascular: Regular rhythm.  Lungs: Normal work of breathing. Neurologic: No focal deficits.   Lab Results  Component Value Date   CREATININE 1.18 04/21/2021   BUN 19 04/21/2021   NA 136 04/21/2021   K 4.0 04/21/2021   CL 104 04/21/2021   CO2 22 04/21/2021   Lab Results  Component Value Date   ALT 54 (H) 04/21/2021   AST 46 (H) 04/21/2021   ALKPHOS 53 04/21/2021   BILITOT 0.4 04/21/2021   Lab Results  Component Value Date   HGBA1C 5.0 02/03/2020   HGBA1C 4.8 05/28/2019   Lab Results  Component Value Date   INSULIN 6.2 02/03/2020   INSULIN 19.0 05/28/2019   Lab Results  Component Value Date   TSH 0.346 (L) 02/03/2020   Lab Results  Component Value Date   CHOL 127 11/03/2020   HDL 47.70 11/03/2020   LDLCALC 63 11/03/2020   TRIG 80.0 11/03/2020   CHOLHDL 3 11/03/2020   Lab Results  Component Value Date   VD25OH 97.9 03/22/2021   VD25OH 65.6 02/03/2020   VD25OH 49.7 05/28/2019   Lab Results  Component Value Date   WBC 10.3 04/21/2021   HGB 14.9 04/21/2021   HCT 44.1 04/21/2021   MCV 88.9 04/21/2021   PLT 209 04/21/2021   No results found for: IRON, TIBC, FERRITIN  Attestation Statements:   Reviewed by clinician on day of visit: allergies, medications, problem list, medical history, surgical history, family history, social history, and previous encounter notes.  I, Lizbeth Bark, RMA, am acting as Location manager for [employer], [place].  I have reviewed the above documentation for accuracy and completeness, and I agree with the  above. -  Georgianne Fick, FNP

## 2021-06-06 DIAGNOSIS — G4710 Hypersomnia, unspecified: Secondary | ICD-10-CM | POA: Diagnosis not present

## 2021-06-06 NOTE — Procedures (Signed)
° ° °  Patient Name: Albert [name], Albert [name] Date: 06/03/2021 Gender: Male D.O.B: Oct 05, 1995 Age (years): 25 Referring Provider: Warnell Forester Height (inches): 17 Interpreting Physician: Baird Lyons MD, ABSM Weight (lbs): 205 RPSGT: Baxter Flattery BMI: 30 MRN: [id number] Neck Size: 17.00  CLINICAL INFORMATION Sleep Study Type: NPSG Indication for sleep study: Obesity, Snoring, Witnesses Apnea / Gasping During Sleep Epworth Sleepiness Score: 3  SLEEP STUDY TECHNIQUE As per the AASM Manual for the Scoring of Sleep and Associated Events v2.3 (April 2016) with a hypopnea requiring 4% desaturations.  The channels recorded and monitored were frontal, central and occipital EEG, electrooculogram (EOG), submentalis EMG (chin), nasal and oral airflow, thoracic and abdominal wall motion, anterior tibialis EMG, snore microphone, electrocardiogram, and pulse oximetry.  MEDICATIONS Medications self-administered by patient taken the night of the study : LIBRIUM, [place] The study was initiated at 10:15:21 PM and ended at 4:38:20 AM.  Sleep onset time was 0.3 minutes and the sleep efficiency was 94.2%%. The total sleep time was 360.7 minutes.  Stage REM latency was 9.5 minutes.  The patient spent 5.0%% of the night in stage N1 sleep, 93.9%% in stage N2 sleep, 0.0%% in stage N3 and 1.1% in REM.  Alpha intrusion was absent.  Supine sleep was 62.17%.  RESPIRATORY PARAMETERS The overall apnea/hypopnea index (AHI) was 2.7 per hour. There were 1 total apneas, including 1 obstructive, 0 central and 0 mixed apneas. There were 15 hypopneas and 1 RERAs.  The AHI during Stage REM sleep was 0.0 per hour.  AHI while supine was 3.2 per hour.  The mean oxygen saturation was 93.3%. The minimum SpO2 during sleep was 88.0%.  soft snoring was noted during this study.  CARDIAC DATA The 2 lead EKG demonstrated sinus rhythm. The mean heart rate was 76.1 beats per minute. Other EKG  findings include: None.  LEG MOVEMENT DATA The total PLMS were 0 with a resulting PLMS index of 0.0. Associated arousal with leg movement index was 0.0 .  IMPRESSIONS - No significant obstructive sleep apnea occurred during this study (AHI = 2.7/h). - The patient had minimal or no oxygen desaturation during the study (Min O2 = 88.0%). Mean O2 saturation 93.3%. - The patient snored with soft snoring volume. - No cardiac abnormalities were noted during this study. - Clinically significant periodic limb movements did not occur during sleep. No significant associated arousals. - Sleep architecture reflected bedtime medication as noted.  DIAGNOSIS - Other sleep disorder  RECOMMENDATIONS - Manage for symptoms based on clinical judgment. - Sleep hygiene should be reviewed to assess factors that may improve sleep quality. - Weight management and regular exercise should be initiated or continued if appropriate.  [Electronically signed] 06/06/2021 11:43 AM  Baird Lyons MD, ABSM Diplomate, American Board of Sleep Medicine   NPI: [id number]                         [place], [place] [facility]  ELECTRONICALLY SIGNED ON:  06/06/2021, 11:38 AM [place] PH: [phone]   FX: [phone] [place]

## 2021-06-07 ENCOUNTER — Other Ambulatory Visit (HOSPITAL_BASED_OUTPATIENT_CLINIC_OR_DEPARTMENT_OTHER): Payer: Self-pay

## 2021-06-07 DIAGNOSIS — Z87442 Personal history of urinary calculi: Secondary | ICD-10-CM | POA: Diagnosis not present

## 2021-06-17 ENCOUNTER — Other Ambulatory Visit (HOSPITAL_BASED_OUTPATIENT_CLINIC_OR_DEPARTMENT_OTHER): Payer: Self-pay

## 2021-06-17 MED ORDER — POTASSIUM CITRATE ER 15 MEQ (1620 MG) PO TBCR
1.0000 | EXTENDED_RELEASE_TABLET | Freq: Two times a day (BID) | ORAL | 3 refills | Status: DC
  Filled 2021-06-17: qty 120, 60d supply, fill #0
  Filled 2021-11-12: qty 120, 60d supply, fill #1
  Filled 2022-01-11: qty 120, 60d supply, fill #2

## 2021-07-01 ENCOUNTER — Other Ambulatory Visit (HOSPITAL_BASED_OUTPATIENT_CLINIC_OR_DEPARTMENT_OTHER): Payer: Self-pay

## 2021-07-01 ENCOUNTER — Other Ambulatory Visit: Payer: Self-pay

## 2021-07-01 ENCOUNTER — Ambulatory Visit (INDEPENDENT_AMBULATORY_CARE_PROVIDER_SITE_OTHER): Payer: 59 | Admitting: Family Medicine

## 2021-07-01 ENCOUNTER — Encounter (INDEPENDENT_AMBULATORY_CARE_PROVIDER_SITE_OTHER): Payer: Self-pay | Admitting: Family Medicine

## 2021-07-01 VITALS — BP 125/74 | HR 66 | Temp 97.8°F | Ht 68.0 in | Wt 207.0 lb

## 2021-07-01 DIAGNOSIS — E8881 Metabolic syndrome: Secondary | ICD-10-CM | POA: Diagnosis not present

## 2021-07-01 DIAGNOSIS — E669 Obesity, unspecified: Secondary | ICD-10-CM | POA: Diagnosis not present

## 2021-07-01 DIAGNOSIS — F339 Major depressive disorder, recurrent, unspecified: Secondary | ICD-10-CM | POA: Diagnosis not present

## 2021-07-01 DIAGNOSIS — Z6831 Body mass index (BMI) 31.0-31.9, adult: Secondary | ICD-10-CM

## 2021-07-01 MED ORDER — WEGOVY 2.4 MG/0.75ML ~~LOC~~ SOAJ
2.4000 mg | SUBCUTANEOUS | 0 refills | Status: DC
  Filled 2021-07-01: qty 3, 28d supply, fill #0

## 2021-07-01 NOTE — Progress Notes (Signed)
Chief Complaint:   OBESITY Albert [name] is here to discuss his progress with his obesity treatment plan along with follow-up of his obesity related diagnoses. Jaran is on keeping a food journal and adhering to recommended goals of 1900-2000 calories and 115 grams of protein and states he is following his eating plan approximately 50% of the time. Malaquias states he is is doing cardio and weights for 75 minutes 3-6 times per week.  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  His hours are 7:30 to 5 Monday through Thursday which will make it hard for him to follow-up at our clinic.  He is up 2 lbs today. He has had some celebrations over the past few weeks and has drank more alcohol.  He had cut out alcohol completely for about 4 months and noticed weight loss.  He journals most days.  Subjective:   1. Insulin resistance Shahiem is on Wegovy 2.4 mg. He has chronic constipation. He is managed with Linzess. He feels appetite suppression is good. Lab Results  Component Value Date   HGBA1C 5.0 02/03/2020   Lab Results  Component Value Date   INSULIN 6.2 02/03/2020   INSULIN 19.0 05/28/2019    2. Recurrent major depressive disorder, remission status unspecified ([place]) Kunal's mood is stable but he notes a lot of stress and even panic attacks over not getting the med school interviews that he expected. He is on librium, Abilify, and Nardil. He sees his psychiatrist monthly and counselor every 2 weeks.   Assessment/Plan:   1. Insulin resistance We will refill Wegovy 2.4 mg weekly.  - Semaglutide-Weight Management (WEGOVY) 2.4 MG/0.75ML SOAJ; Inject 2.4 mg into the skin once a week.  Dispense: 3 mL; Refill: 0  2. Recurrent major depressive  disorder, remission status unspecified ([place]) Erma will continue all medications. He will follow up with psychiatry and counselor. I encouraged Wladyslaw to stop alcohol since his medications should not be taken with alcohol.  3. Obesity with current BMI of 31.48 Brenson is currently in the action stage of change. As such, his goal is to continue with weight loss efforts. He has agreed to keeping a food journal and adhering to recommended goals of 1900-2000 calories and 115 grams of protein  protein daily.   Yakov will reduce alcohol intake.  Exercise goals:  As is  Behavioral modification strategies: decreasing alcohol intake.  Cypress has agreed to follow-up with our clinic in 4-5 weeks (virtual).   Objective:   Blood pressure 125/74, pulse 66, temperature 97.8 F (36.6 C), height 5\' 8"  (1.727 m), weight 207 lb (93.9 kg), SpO2 99 %. Body mass index is 31.47 kg/m.  General: Cooperative, alert, well developed, in no acute distress. HEENT: Conjunctivae and lids unremarkable. Cardiovascular: Regular rhythm.  Lungs: Normal work of breathing. Neurologic: No focal deficits.   Lab Results  Component Value Date   CREATININE 1.18 04/21/2021   BUN 19 04/21/2021   NA 136 04/21/2021   K 4.0 04/21/2021   CL 104 04/21/2021   CO2 22 04/21/2021   Lab Results  Component Value Date   ALT 54 (H) 04/21/2021   AST 46 (H) 04/21/2021   ALKPHOS 53 04/21/2021   BILITOT 0.4 04/21/2021  Lab Results  Component Value Date   HGBA1C 5.0 02/03/2020   HGBA1C 4.8 05/28/2019   Lab Results  Component Value Date   INSULIN 6.2 02/03/2020   INSULIN 19.0 05/28/2019   Lab Results  Component Value Date   TSH 0.346 (L) 02/03/2020   Lab Results  Component Value Date   CHOL 127 11/03/2020   HDL 47.70 11/03/2020   LDLCALC 63 11/03/2020   TRIG 80.0 11/03/2020   CHOLHDL 3 11/03/2020   Lab Results  Component Value Date   VD25OH 97.9 03/22/2021   VD25OH 65.6 02/03/2020   VD25OH 49.7 05/28/2019    Lab Results  Component Value Date   WBC 10.3 04/21/2021   HGB 14.9 04/21/2021   HCT 44.1 04/21/2021   MCV 88.9 04/21/2021   PLT 209 04/21/2021   No results found for: IRON, TIBC, FERRITIN  Attestation Statements:   Reviewed by clinician on day of visit: allergies, medications, problem list, medical history, surgical history, family history, social history, and previous encounter notes.  I, Lizbeth Bark, RMA, am acting as Location manager for [employer], [place].  I have reviewed the above documentation for accuracy and completeness, and I agree with the above. -  Georgianne Fick, FNP

## 2021-07-02 ENCOUNTER — Other Ambulatory Visit (HOSPITAL_BASED_OUTPATIENT_CLINIC_OR_DEPARTMENT_OTHER): Payer: Self-pay

## 2021-07-02 DIAGNOSIS — F411 Generalized anxiety disorder: Secondary | ICD-10-CM | POA: Diagnosis not present

## 2021-07-02 DIAGNOSIS — F3341 Major depressive disorder, recurrent, in partial remission: Secondary | ICD-10-CM | POA: Diagnosis not present

## 2021-07-02 MED ORDER — PHENELZINE SULFATE 15 MG PO TABS
ORAL_TABLET | ORAL | 2 refills | Status: DC
  Filled 2021-07-02: qty 240, 30d supply, fill #0
  Filled 2021-08-05: qty 240, 30d supply, fill #1
  Filled 2021-11-12 – 2021-11-16 (×2): qty 240, 30d supply, fill #2

## 2021-07-02 MED ORDER — CHLORDIAZEPOXIDE HCL 10 MG PO CAPS
ORAL_CAPSULE | ORAL | 0 refills | Status: DC
  Filled 2021-07-02: qty 90, 90d supply, fill #0

## 2021-07-03 ENCOUNTER — Other Ambulatory Visit (HOSPITAL_BASED_OUTPATIENT_CLINIC_OR_DEPARTMENT_OTHER): Payer: Self-pay

## 2021-07-05 ENCOUNTER — Other Ambulatory Visit (HOSPITAL_BASED_OUTPATIENT_CLINIC_OR_DEPARTMENT_OTHER): Payer: Self-pay

## 2021-07-09 ENCOUNTER — Other Ambulatory Visit (HOSPITAL_BASED_OUTPATIENT_CLINIC_OR_DEPARTMENT_OTHER): Payer: Self-pay

## 2021-07-09 MED ORDER — LIOTHYRONINE SODIUM 5 MCG PO TABS
5.0000 ug | ORAL_TABLET | Freq: Two times a day (BID) | ORAL | 6 refills | Status: DC
  Filled 2021-07-09: qty 60, 30d supply, fill #0
  Filled 2022-02-08: qty 60, 30d supply, fill #1

## 2021-07-15 ENCOUNTER — Other Ambulatory Visit (HOSPITAL_BASED_OUTPATIENT_CLINIC_OR_DEPARTMENT_OTHER): Payer: Self-pay

## 2021-07-19 DIAGNOSIS — L0889 Other specified local infections of the skin and subcutaneous tissue: Secondary | ICD-10-CM | POA: Diagnosis not present

## 2021-07-19 DIAGNOSIS — L738 Other specified follicular disorders: Secondary | ICD-10-CM | POA: Diagnosis not present

## 2021-07-19 DIAGNOSIS — D2362 Other benign neoplasm of skin of left upper limb, including shoulder: Secondary | ICD-10-CM | POA: Diagnosis not present

## 2021-07-19 DIAGNOSIS — L858 Other specified epidermal thickening: Secondary | ICD-10-CM | POA: Diagnosis not present

## 2021-07-28 ENCOUNTER — Encounter (INDEPENDENT_AMBULATORY_CARE_PROVIDER_SITE_OTHER): Payer: Self-pay | Admitting: Family Medicine

## 2021-07-28 ENCOUNTER — Other Ambulatory Visit (HOSPITAL_BASED_OUTPATIENT_CLINIC_OR_DEPARTMENT_OTHER): Payer: Self-pay

## 2021-07-28 ENCOUNTER — Telehealth (INDEPENDENT_AMBULATORY_CARE_PROVIDER_SITE_OTHER): Payer: 59 | Admitting: Family Medicine

## 2021-07-28 DIAGNOSIS — E559 Vitamin D deficiency, unspecified: Secondary | ICD-10-CM | POA: Diagnosis not present

## 2021-07-28 DIAGNOSIS — E88819 Insulin resistance, unspecified: Secondary | ICD-10-CM

## 2021-07-28 DIAGNOSIS — Z6835 Body mass index (BMI) 35.0-35.9, adult: Secondary | ICD-10-CM

## 2021-07-28 DIAGNOSIS — E669 Obesity, unspecified: Secondary | ICD-10-CM

## 2021-07-28 DIAGNOSIS — E785 Hyperlipidemia, unspecified: Secondary | ICD-10-CM

## 2021-07-28 DIAGNOSIS — Z6830 Body mass index (BMI) 30.0-30.9, adult: Secondary | ICD-10-CM

## 2021-07-28 DIAGNOSIS — E8881 Metabolic syndrome: Secondary | ICD-10-CM | POA: Diagnosis not present

## 2021-07-28 MED ORDER — WEGOVY 2.4 MG/0.75ML ~~LOC~~ SOAJ
2.4000 mg | SUBCUTANEOUS | 0 refills | Status: DC
  Filled 2021-07-28: qty 3, 28d supply, fill #0

## 2021-07-28 NOTE — Progress Notes (Signed)
?TeleHealth Visit:  ?Due to the COVID-19 pandemic, this visit was completed with telemedicine (audio/video) technology to reduce patient and provider exposure as well as to preserve personal protective equipment.  ? ?Albert [name] has verbally consented to this TeleHealth visit. The patient is located at home, the provider is located at home. The participants in this visit include the listed provider and patient. The visit was conducted today via MyChart video. ? ?OBESITY ?Albert [name] is here to discuss his progress with his obesity treatment plan along with follow-up of his obesity related diagnoses.  ? ?Today's date: 07/28/2021 ?Today's visit was # 31 ?Starting weight: 236 lbs ?Starting date: 05/28/2019 ?Today's reported weight: 202 lbs ?Total lbs lost to date: 34 lbs ?Weight change since last visit: - 5 ? ? ?Interim History: Albert [name] says he has journaled very consistently over the past few weeks and met protein and calorie goals.  He is exceeding protein goals and getting about 130 g/day.  He reports he has cut back on alcohol overall due to the fact he recently started a new job and he has less time overall.  ?He has amped up his exercise routine and is doing resistance training 3 days/week in the gym and kickboxing class 2 days/week. 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 ? ?Nutrition Plan: keeping a food journal and adhering to recommended goals of 1900-2000 calories and 115 protein.  ?Anti-obesity medications: Wegovy 2.4 mg weekly. Reported side effects: None. ?Hunger is well controlled. Cravings are well controlled.  ?Activity: Resistance training at the gym 3 days/week and kickboxing 2 days/week ? ? ?Assessment/Plan:  ? ?Insulin Resistance ?Assessment ?Albert [name] has had elevated fasting insulin readings. Goal is HgbA1c < 5.7, fasting insulin closer to 5.   ?He denies polyphagia.  ?Medication(s): Wegovy 2.4 mg weekly  ?Lab Results  ?Component Value Date  ? HGBA1C 5.0 02/03/2020   ? ?Lab Results  ?Component Value Date  ? INSULIN 6.2 02/03/2020  ? INSULIN 19.0 05/28/2019  ? ? ?Plan ?Refill Wegovy 2.4 mg weekly ? ?Vitamin D Deficiency ?Vitamin D is at goal of 50.  He is on 5000 international units of OTC vitamin D every other day.  Dose was reduced to every other day from daily in October when vitamin D level was nearly over replaced. ?Lab Results  ?Component Value Date  ? VD25OH 97.9 03/22/2021  ? VD25OH 65.6 02/03/2020  ? VD25OH 49.7 05/28/2019  ? ? ?Plan: ?Continue 5000 international units of OTC vitamin D every other day.  ? ? ?Obesity with current BMI of 30.69 ? ?Albert [name] is currently in the action stage of change. As such, his goal is to continue with weight loss efforts. He has agreed to keeping a food journal and adhering to recommended goals of 1900-2000 calories and 115 gms protein.  ? ?Exercise goals: Continue current regimen. ? ?Behavioral modification strategies: keeping a strict food journal. ? ?Albert [name] has agreed to follow-up with our clinic in 4 weeks.  ? ? ?Medications Discontinued During This Encounter  ?Medication Reason  ? Semaglutide-Weight Management (WEGOVY) 2.4 MG/0.75ML SOAJ Reorder  ?  ? ?Meds ordered this encounter  ?Medications  ? Semaglutide-Weight Management (WEGOVY) 2.4 MG/0.75ML SOAJ  ?  Sig: Inject 2.4 mg into the skin once a week.  ?  Dispense:  3 mL  ?  Refill:  0  ?  Order Specific Question:   Supervising Provider  ?  Answer:   Dennard Nip D [[id number]]  ?   ? ?  Objective:  ? ?VITALS: Per patient if applicable, see vitals. ?GENERAL: Alert and in no acute distress. ?CARDIOPULMONARY: No increased WOB. Speaking in clear sentences.  ?PSYCH: Pleasant and cooperative. Speech normal rate and rhythm. Affect is appropriate. Insight and judgement are appropriate. Attention is focused, linear, and appropriate.  ?NEURO: Oriented as arrived to appointment on time with no prompting.  ? ?Lab Results  ?Component Value Date  ? CREATININE 1.18 04/21/2021  ? BUN 19 04/21/2021  ?  NA 136 04/21/2021  ? K 4.0 04/21/2021  ? CL 104 04/21/2021  ? CO2 22 04/21/2021  ? ?Lab Results  ?Component Value Date  ? ALT 54 (H) 04/21/2021  ? AST 46 (H) 04/21/2021  ? ALKPHOS 53 04/21/2021  ? BILITOT 0.4 04/21/2021  ? ?Lab Results  ?Component Value Date  ? HGBA1C 5.0 02/03/2020  ? HGBA1C 4.8 05/28/2019  ? ?Lab Results  ?Component Value Date  ? INSULIN 6.2 02/03/2020  ? INSULIN 19.0 05/28/2019  ? ?Lab Results  ?Component Value Date  ? TSH 0.346 (L) 02/03/2020  ? ?Lab Results  ?Component Value Date  ? CHOL 127 11/03/2020  ? HDL 47.70 11/03/2020  ? [place] 63 11/03/2020  ? TRIG 80.0 11/03/2020  ? CHOLHDL 3 11/03/2020  ? ?Lab Results  ?Component Value Date  ? WBC 10.3 04/21/2021  ? HGB 14.9 04/21/2021  ? HCT 44.1 04/21/2021  ? MCV 88.9 04/21/2021  ? PLT 209 04/21/2021  ? ?No results found for: IRON, TIBC, FERRITIN ?Lab Results  ?Component Value Date  ? VD25OH 97.9 03/22/2021  ? VD25OH 65.6 02/03/2020  ? VD25OH 49.7 05/28/2019  ? ? ?Attestation Statements:  ? ?Reviewed by clinician on day of visit: allergies, medications, problem list, medical history, surgical history, family history, social history, and previous encounter notes. ? ? ?

## 2021-07-28 NOTE — Telephone Encounter (Signed)
FYI

## 2021-08-05 ENCOUNTER — Other Ambulatory Visit (HOSPITAL_BASED_OUTPATIENT_CLINIC_OR_DEPARTMENT_OTHER): Payer: Self-pay

## 2021-08-09 ENCOUNTER — Other Ambulatory Visit (HOSPITAL_BASED_OUTPATIENT_CLINIC_OR_DEPARTMENT_OTHER): Payer: Self-pay

## 2021-08-10 ENCOUNTER — Other Ambulatory Visit (HOSPITAL_BASED_OUTPATIENT_CLINIC_OR_DEPARTMENT_OTHER): Payer: Self-pay

## 2021-08-13 ENCOUNTER — Other Ambulatory Visit (HOSPITAL_BASED_OUTPATIENT_CLINIC_OR_DEPARTMENT_OTHER): Payer: Self-pay

## 2021-08-13 DIAGNOSIS — F3341 Major depressive disorder, recurrent, in partial remission: Secondary | ICD-10-CM | POA: Diagnosis not present

## 2021-08-13 DIAGNOSIS — F411 Generalized anxiety disorder: Secondary | ICD-10-CM | POA: Diagnosis not present

## 2021-08-13 MED ORDER — PRAMIPEXOLE DIHYDROCHLORIDE 1 MG PO TABS
1.0000 mg | ORAL_TABLET | Freq: Every day | ORAL | 1 refills | Status: DC
  Filled 2021-08-13 – 2021-09-04 (×2): qty 90, 90d supply, fill #0
  Filled 2021-11-12 – 2021-12-20 (×2): qty 90, 90d supply, fill #1

## 2021-08-13 MED ORDER — PHENELZINE SULFATE 15 MG PO TABS
60.0000 mg | ORAL_TABLET | Freq: Two times a day (BID) | ORAL | 1 refills | Status: DC
  Filled 2021-08-13: qty 720, 90d supply, fill #0
  Filled 2021-09-04: qty 60, 8d supply, fill #0
  Filled 2021-09-06: qty 660, 82d supply, fill #0
  Filled 2021-11-12: qty 720, 90d supply, fill #1
  Filled 2021-11-17: qty 660, 82d supply, fill #1
  Filled 2021-11-17: qty 60, 8d supply, fill #1

## 2021-08-16 ENCOUNTER — Other Ambulatory Visit (HOSPITAL_BASED_OUTPATIENT_CLINIC_OR_DEPARTMENT_OTHER): Payer: Self-pay

## 2021-08-24 NOTE — Progress Notes (Signed)
?TeleHealth Visit:  ?Due to the COVID-19 pandemic, this visit was completed with telemedicine (audio/video) technology to reduce patient and provider exposure as well as to preserve personal protective equipment.  ? ?[name] has verbally consented to this TeleHealth visit. The patient is located at home, the provider is located at home. The participants in this visit include the listed provider and patient. The visit was conducted today via MyChart video. ? ?OBESITY ?Albert [name] is here to discuss his progress with his obesity treatment plan along with follow-up of his obesity related diagnoses.  ? ?Today's visit was # 32 ?Starting weight: 236 lbs ?Starting date: 05/28/19 ?Total weight loss: 29 lbs at last in office visit. ?Weight at last in office visit: 207 lbs ?Reported weight at last virtual visit: 202 lbs ?Today's reported weight: 204.5 lbs  ?Weight change since last visit: +2.5 ? ?Nutrition Plan: keeping a food journal and adhering to recommended goals of 1900-2000 calories and 115 gms protein.  ?Hunger is poorly controlled. Cravings are poorly controlled.  ?Current exercise: aerobics, weightlifting, and kickboxing 5 days per week. ? ?Interim History: Albert [name] has been off his [facility] for a week and notes increased appetite.  He is up a few pounds from his last reported weight.  Other than the past week he has been journaling consistently and meeting his calorie and protein goals.  He also had a needlestick at work and has been taking HIV prophylaxis which has increased his appetite.  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  He has a goal of being accepted to med school and is trying to enrich his resume with more direct patient care. ? ?Assessment/Plan:  ?1. Insulin Resistance ?[name] has had elevated fasting insulin readings. Goal is HgbA1c < 5.7, fasting insulin closer to 5.   ?He reports polyphagia.  ?Medication(s): Wegovy 2.4 mg. He has been off of his Mancel Parsons this past week because  he forgot the medication at his parents' house. He notes marked increase in appetite.  Has chronic constipation which is managed well with Linzess. ?Lab Results  ?Component Value Date  ? HGBA1C 5.0 02/03/2020  ? ?Lab Results  ?Component Value Date  ? INSULIN 6.2 02/03/2020  ? INSULIN 19.0 05/28/2019  ? ?Plan ?Refill Wegovy 2.4 mg weekly.  ? ?2. 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  He notes that his mood has been somewhat down over the past few weeks.  He sees his psychiatrist and psychologist regularly.  He actually sees a psychologist biweekly but may be changing to weekly.  He is currently on Librium, Nardil, Abilify,  and Mirapex. ? ?Plan: ?Continue all medications as ordered by psychiatry. ?Follow-up with psychiatrist and psychologist as directed. ? ?3. Obesity: Current BMI 31.48 ?Albert [name] is currently in the action stage of change. As such, his goal is to continue with weight loss efforts. He has agreed tokeeping a food journal and adhering to recommended goals of 1900-2000 calories and 115 gms protein.  ? ?Exercise goals: continue current regimen ? ?Behavioral modification strategies: increasing lean protein intake and decreasing simple carbohydrates. ? ?[name] has agreed to follow-up with our clinic in 5 weeks with fasting labs. ? ?No orders of the defined types were placed in this encounter. ? ? ?Medications Discontinued During This Encounter  ?Medication Reason  ? Semaglutide-Weight Management (WEGOVY) 2.4 MG/0.75ML SOAJ Reorder  ?  ? ?Meds ordered this encounter  ?Medications  ? Semaglutide-Weight Management (WEGOVY) 2.4 MG/0.75ML SOAJ  ?  Sig: Inject 2.4 mg into the skin once a week.  ?  Dispense:  3 mL  ?  Refill:  0  ?  Order Specific Question:   Supervising Provider  ?  Answer:   Dennard Nip D [[id number]]  ?   ? ?Objective:  ? ?VITALS: Per patient if applicable, see vitals. ?GENERAL: Alert and in no acute distress. ?CARDIOPULMONARY: No increased WOB. Speaking in  clear sentences.  ?PSYCH: Pleasant and cooperative. Speech normal rate and rhythm. Affect is appropriate. Insight and judgement are appropriate. Attention is focused, linear, and appropriate.  ?NEURO: Oriented as arrived to appointment on time with no prompting.  ? ?Lab Results  ?Component Value Date  ? CREATININE 1.18 04/21/2021  ? BUN 19 04/21/2021  ? NA 136 04/21/2021  ? K 4.0 04/21/2021  ? CL 104 04/21/2021  ? CO2 22 04/21/2021  ? ?Lab Results  ?Component Value Date  ? ALT 54 (H) 04/21/2021  ? AST 46 (H) 04/21/2021  ? ALKPHOS 53 04/21/2021  ? BILITOT 0.4 04/21/2021  ? ?Lab Results  ?Component Value Date  ? HGBA1C 5.0 02/03/2020  ? HGBA1C 4.8 05/28/2019  ? ?Lab Results  ?Component Value Date  ? INSULIN 6.2 02/03/2020  ? INSULIN 19.0 05/28/2019  ? ?Lab Results  ?Component Value Date  ? TSH 0.346 (L) 02/03/2020  ? ?Lab Results  ?Component Value Date  ? CHOL 127 11/03/2020  ? HDL 47.70 11/03/2020  ? [place] 63 11/03/2020  ? TRIG 80.0 11/03/2020  ? CHOLHDL 3 11/03/2020  ? ?Lab Results  ?Component Value Date  ? WBC 10.3 04/21/2021  ? HGB 14.9 04/21/2021  ? HCT 44.1 04/21/2021  ? MCV 88.9 04/21/2021  ? PLT 209 04/21/2021  ? ?No results found for: IRON, TIBC, FERRITIN ?Lab Results  ?Component Value Date  ? VD25OH 97.9 03/22/2021  ? VD25OH 65.6 02/03/2020  ? VD25OH 49.7 05/28/2019  ? ? ?Attestation Statements:  ? ?Reviewed by clinician on day of visit: allergies, medications, problem list, medical history, surgical history, family history, social history, and previous encounter notes. ? ?

## 2021-08-25 ENCOUNTER — Other Ambulatory Visit (HOSPITAL_BASED_OUTPATIENT_CLINIC_OR_DEPARTMENT_OTHER): Payer: Self-pay

## 2021-08-25 ENCOUNTER — Telehealth (INDEPENDENT_AMBULATORY_CARE_PROVIDER_SITE_OTHER): Payer: 59 | Admitting: Family Medicine

## 2021-08-25 ENCOUNTER — Encounter (INDEPENDENT_AMBULATORY_CARE_PROVIDER_SITE_OTHER): Payer: Self-pay | Admitting: Family Medicine

## 2021-08-25 DIAGNOSIS — Z6831 Body mass index (BMI) 31.0-31.9, adult: Secondary | ICD-10-CM

## 2021-08-25 DIAGNOSIS — E669 Obesity, unspecified: Secondary | ICD-10-CM

## 2021-08-25 DIAGNOSIS — E88819 Insulin resistance, unspecified: Secondary | ICD-10-CM

## 2021-08-25 DIAGNOSIS — E8881 Metabolic syndrome: Secondary | ICD-10-CM

## 2021-08-25 DIAGNOSIS — F3289 Other specified depressive episodes: Secondary | ICD-10-CM | POA: Diagnosis not present

## 2021-08-25 MED ORDER — WEGOVY 2.4 MG/0.75ML ~~LOC~~ SOAJ
2.4000 mg | SUBCUTANEOUS | 0 refills | Status: DC
  Filled 2021-08-25: qty 3, 28d supply, fill #0

## 2021-09-04 ENCOUNTER — Other Ambulatory Visit (HOSPITAL_BASED_OUTPATIENT_CLINIC_OR_DEPARTMENT_OTHER): Payer: Self-pay

## 2021-09-06 ENCOUNTER — Other Ambulatory Visit (HOSPITAL_BASED_OUTPATIENT_CLINIC_OR_DEPARTMENT_OTHER): Payer: Self-pay

## 2021-09-06 MED ORDER — LEVOTHYROXINE SODIUM 25 MCG PO TABS
ORAL_TABLET | ORAL | 11 refills | Status: DC
  Filled 2021-09-06: qty 30, 30d supply, fill #0
  Filled 2021-11-12: qty 30, 30d supply, fill #1

## 2021-09-07 ENCOUNTER — Other Ambulatory Visit (HOSPITAL_BASED_OUTPATIENT_CLINIC_OR_DEPARTMENT_OTHER): Payer: Self-pay

## 2021-09-24 ENCOUNTER — Other Ambulatory Visit (INDEPENDENT_AMBULATORY_CARE_PROVIDER_SITE_OTHER): Payer: Self-pay | Admitting: Family Medicine

## 2021-09-24 ENCOUNTER — Other Ambulatory Visit (HOSPITAL_BASED_OUTPATIENT_CLINIC_OR_DEPARTMENT_OTHER): Payer: Self-pay

## 2021-09-24 DIAGNOSIS — E8881 Metabolic syndrome: Secondary | ICD-10-CM

## 2021-09-27 ENCOUNTER — Other Ambulatory Visit (HOSPITAL_BASED_OUTPATIENT_CLINIC_OR_DEPARTMENT_OTHER): Payer: Self-pay

## 2021-09-27 ENCOUNTER — Ambulatory Visit (INDEPENDENT_AMBULATORY_CARE_PROVIDER_SITE_OTHER): Payer: 59 | Admitting: Physician Assistant

## 2021-09-28 ENCOUNTER — Ambulatory Visit (INDEPENDENT_AMBULATORY_CARE_PROVIDER_SITE_OTHER): Payer: 59 | Admitting: Family Medicine

## 2021-09-28 ENCOUNTER — Encounter (INDEPENDENT_AMBULATORY_CARE_PROVIDER_SITE_OTHER): Payer: Self-pay | Admitting: Family Medicine

## 2021-09-28 ENCOUNTER — Other Ambulatory Visit (HOSPITAL_BASED_OUTPATIENT_CLINIC_OR_DEPARTMENT_OTHER): Payer: Self-pay

## 2021-09-28 VITALS — BP 84/58 | HR 67 | Temp 97.9°F | Ht 68.0 in | Wt 202.0 lb

## 2021-09-28 DIAGNOSIS — Z6830 Body mass index (BMI) 30.0-30.9, adult: Secondary | ICD-10-CM | POA: Diagnosis not present

## 2021-09-28 DIAGNOSIS — E669 Obesity, unspecified: Secondary | ICD-10-CM | POA: Diagnosis not present

## 2021-09-28 DIAGNOSIS — Z9189 Other specified personal risk factors, not elsewhere classified: Secondary | ICD-10-CM

## 2021-09-28 DIAGNOSIS — E8881 Metabolic syndrome: Secondary | ICD-10-CM | POA: Diagnosis not present

## 2021-09-28 MED ORDER — WEGOVY 2.4 MG/0.75ML ~~LOC~~ SOAJ
2.4000 mg | SUBCUTANEOUS | 0 refills | Status: DC
  Filled 2021-09-28: qty 3, 28d supply, fill #0

## 2021-10-01 ENCOUNTER — Other Ambulatory Visit (HOSPITAL_BASED_OUTPATIENT_CLINIC_OR_DEPARTMENT_OTHER): Payer: Self-pay

## 2021-10-01 MED ORDER — CHLORDIAZEPOXIDE HCL 10 MG PO CAPS
ORAL_CAPSULE | ORAL | 0 refills | Status: DC
  Filled 2021-10-01: qty 90, 90d supply, fill #0

## 2021-10-09 NOTE — Progress Notes (Signed)
Chief Complaint:   OBESITY Albert [name] is here to discuss his progress with his obesity treatment plan along with follow-up of his obesity related diagnoses. Albert [name] is on keeping a food journal and adhering to recommended goals of 1900-2000 calories and 115 protein and states he is following his eating plan approximately 60% of the time. Albert [name] states he is weight lifting and running 90 minutes 4 times per week.  Today's visit was #: 8 Starting weight: 236 lbs Starting date: 05/28/2019 Today's weight: 202 lbs Today's date: 09/28/2021 Total lbs lost to date: 34 lb Total lbs lost since last in-office visit: 5 lbs  Interim History: Albert [name] is here today for his first follow-up office visit with me, as he has last saw Albert [name] on 08/25/2021 via telehealth, since starting the program with [name].  All blood work/ lab tests that were recently ordered by myself or an outside provider were reviewed with patient today per their request.   Extended time was spent counseling him on all new disease processes that were discovered or preexisting ones that are affected by BMI.  he understands that many of these abnormalities will need to monitored regularly along with the current treatment plan of prudent dietary changes, in which we are making each and every office visit, to improve these health parameters. Albert [name] is doing a great job with working out as well! 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  Subjective:   1. Insulin resistance Albert [name] denies hunger and no increase in cravings unless not eating all the protein or unless eating off the plan.  2. At risk for impaired metabolic function Albert [name] is at a higher risk of impaired metabolic function due to his insulin resistance.  Assessment/Plan:  No orders of the defined types were placed in this encounter.   Medications Discontinued During This Encounter  Medication Reason   Semaglutide-Weight  Management (WEGOVY) 2.4 MG/0.75ML SOAJ Reorder     Meds ordered this encounter  Medications   Semaglutide-Weight Management (WEGOVY) 2.4 MG/0.75ML SOAJ    Sig: Inject 2.4 mg into the skin once a week.    Dispense:  3 mL    Refill:  0     1. Insulin resistance Albert [name] will continue to work on weight loss, exercise, and decreasing simple carbohydrates to help decrease the risk of diabetes. Albert [name] agreed to follow-up with [name] as directed to closely monitor his progress. We will refill Wegovy with no change in dosage ne - Semaglutide-Weight Management (WEGOVY) 2.4 MG/0.75ML SOAJ; Inject 2.4 mg into the skin once a week.  Dispense: 3 mL; Refill: 0  2. At risk for impaired metabolic function Albert [name] was given approximately 9 minutes of impaired  metabolic function prevention counseling today. We discussed intensive lifestyle modifications today with an emphasis on specific nutrition and exercise instructions and strategies.   Repetitive spaced learning was employed today to elicit superior memory formation and behavioral change.   3. Obesity with current BMI of 30.7 Albert [name] is currently in the action stage of change. As such, his goal is to continue with weight loss efforts. He has agreed to keeping a food journal and adhering to recommended goals of 1900-2000 calories and 115 grams of protein.   Exercise goals: As is.  Behavioral modification strategies: planning for success and keeping a strict food journal.  Albert [name] has agreed to follow-up with our clinic in 3-4 weeks with Albert Bathe, FNP  and the 6-8 weeks with me and will be fasting and have a repeat IC test (advised to arrive 30 minutes early for the appointment).  He was informed of the importance of frequent follow-up visits to maximize his success with intensive lifestyle modifications for his multiple health conditions.   Objective:   Blood pressure (!) 84/58, pulse 67, temperature 97.9 F (36.6 C), temperature source Oral, height  '5\' 8"'$  (1.727 m), weight 202 lb (91.6 kg), SpO2 95 %. Body mass index is 30.71 kg/m.  General: Cooperative, alert, well developed, in no acute distress. HEENT: Conjunctivae and lids unremarkable. Cardiovascular: Regular rhythm.  Lungs: Normal work of breathing. Neurologic: No focal deficits.   Lab Results  Component Value Date   CREATININE 1.18 04/21/2021   BUN 19 04/21/2021   NA 136 04/21/2021   K 4.0 04/21/2021   CL 104 04/21/2021   CO2 22 04/21/2021   Lab Results  Component Value Date   ALT 54 (H) 04/21/2021   AST 46 (H) 04/21/2021   ALKPHOS 53 04/21/2021   BILITOT 0.4 04/21/2021   Lab Results  Component Value Date   HGBA1C 5.0 02/03/2020   HGBA1C 4.8 05/28/2019   Lab Results  Component Value Date   INSULIN 6.2 02/03/2020   INSULIN 19.0 05/28/2019   Lab Results  Component Value Date   TSH 0.346 (L) 02/03/2020   Lab Results  Component Value Date   CHOL 127 11/03/2020   HDL 47.70 11/03/2020   LDLCALC 63 11/03/2020   TRIG 80.0 11/03/2020   CHOLHDL 3 11/03/2020   Lab Results  Component Value Date   VD25OH 97.9 03/22/2021   VD25OH 65.6 02/03/2020   VD25OH 49.7 05/28/2019   Lab Results  Component Value Date   WBC 10.3 04/21/2021   HGB 14.9 04/21/2021   HCT 44.1 04/21/2021   MCV 88.9 04/21/2021   PLT 209 04/21/2021   No results found for: IRON, TIBC, FERRITIN  Attestation Statements:   Reviewed by clinician on day of visit: allergies, medications, problem list, medical history, surgical history, family history, social history, and previous encounter notes.  ILennette Bihari, CMA, am acting as transcriptionist for Dr. Raliegh Scarlet, DO.  I have reviewed the above documentation for accuracy and completeness, and I agree with the above. Marjory Sneddon, D.O.  The [place] was signed into law in 2016 which includes the topic of electronic health records.  This provides immediate access to information in MyChart.  This includes consultation  notes, operative notes, office notes, lab results and pathology reports.  If you have any questions about what you read please let us know at your next visit so we can discuss your concerns and take corrective action if need be.  We are right here with you.

## 2021-10-12 ENCOUNTER — Other Ambulatory Visit (HOSPITAL_BASED_OUTPATIENT_CLINIC_OR_DEPARTMENT_OTHER): Payer: Self-pay

## 2021-10-12 MED ORDER — LEVOTHYROXINE SODIUM 25 MCG PO TABS
ORAL_TABLET | ORAL | 2 refills | Status: DC
  Filled 2021-10-12: qty 25, 30d supply, fill #0
  Filled 2021-12-24: qty 18, 22d supply, fill #1
  Filled 2021-12-24: qty 7, 8d supply, fill #1

## 2021-10-20 NOTE — Progress Notes (Signed)
TeleHealth Visit:  This visit was completed with telemedicine (audio/video) technology. Albert [name] has verbally consented to this TeleHealth visit. The patient is located at home, the provider is located at home. The participants in this visit include the listed provider and patient. The visit was conducted today via MyChart video.  OBESITY Albert [name] is here to discuss his progress with his obesity treatment plan along with follow-up of his obesity related diagnoses.   Today's visit was # 34 Starting weight: 236 lbs Starting date: 05/28/2019 Weight at last in office visit: 202 lbs on 09/28/21 Total weight loss: 34 lbs at last in office visit on 09/28/21. Today's reported weight: 206 lbs   Nutrition Plan: keeping a food journal and adhering to recommended goals of 1900-2000 calories and 115 gms protein.  Hunger is well controlled. Cravings are well controlled.  Current exercise: weight lifting and running 90 minutes 3-4 times per week.  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  His goal is to go to med school. He says he journals most every day but does not always capture all of his meals.  He only journals all food 2 to 3 days/week.  He tends to eat healthy at breakfast but not as well the rest of the day.  He has been eating out more. He also reports he is not exercising as frequently as he had been. His goal is 15% body fat. He has a question about possibly starting Mounjaro. He wants to schedule a virtual visit after his visit with Dr. Sharee Holster on June 22 but does not know his schedule. Assessment/Plan:  1.  Other depression Albert [name] does not feel his mood is a stable as he would like it to be.  He has history of psychotic breaks, anxiety, depression.  He feels that exercising less frequently has been detrimental to his mood. He is currently on phenelzine, Nardil. He generally sees his therapist weekly but has not had a visit in 3 weeks due to his busy  schedule.  He has a follow-up with his psychiatrist in 2 weeks. He says he wants to find somebody to see who does psychoanalysis.  Plan: Follow-up with psychiatry and psychology as directed.  Continue all meds as directed.   2. Insulin Resistance  Insulin levels have improved dramatically. He denies polyphagia. Medication(s): Wegovy 2.4 mg weekly.  He definitely feels this helps with appetite. Lab Results  Component Value Date   HGBA1C 5.0 02/03/2020   Lab Results  Component Value Date   INSULIN 6.2 02/03/2020   INSULIN 19.0 05/28/2019    Plan Refill Wegovy 2.4 mg weekly.   3. Obesity: Current BMI 30.72 Albert [name] is currently in the action stage of change. As such, his goal is to continue with weight loss efforts.  He has agreed to keeping a food journal and adhering to recommended goals of 1900-2000 calories and 115 gms protein. .  Advised that his insurance will not cover Mounjaro since he does not have diabetes. Exercise goals: as is  Behavioral modification strategies: increasing lean protein intake, decreasing simple carbohydrates, decreasing eating out, and keeping a strict food journal.  Albert [name] has agreed to follow-up with our clinic in 3 weeks.   No orders of the defined types were placed in this encounter.   Medications Discontinued During This Encounter  Medication Reason   Semaglutide-Weight Management (WEGOVY) 2.4 MG/0.75ML SOAJ Reorder   ARIPiprazole (ABILIFY) 2 MG tablet Patient Preference     Meds ordered  this encounter  Medications   Semaglutide-Weight Management (WEGOVY) 2.4 MG/0.75ML SOAJ    Sig: Inject 2.4 mg into the skin once a week.    Dispense:  3 mL    Refill:  0    Order Specific Question:   Supervising Provider    Answer:   Quillian Quince D [[id number]]      Objective:   VITALS: Per patient if applicable, see vitals. GENERAL: Alert and in no acute distress. CARDIOPULMONARY: No increased WOB. Speaking in clear sentences.  PSYCH: Pleasant  and cooperative. Speech normal rate and rhythm. Affect is appropriate. Insight and judgement are appropriate. Attention is focused, linear, and appropriate.  NEURO: Oriented as arrived to appointment on time with no prompting.   Lab Results  Component Value Date   CREATININE 1.18 04/21/2021   BUN 19 04/21/2021   NA 136 04/21/2021   K 4.0 04/21/2021   CL 104 04/21/2021   CO2 22 04/21/2021   Lab Results  Component Value Date   ALT 54 (H) 04/21/2021   AST 46 (H) 04/21/2021   ALKPHOS 53 04/21/2021   BILITOT 0.4 04/21/2021   Lab Results  Component Value Date   HGBA1C 5.0 02/03/2020   HGBA1C 4.8 05/28/2019   Lab Results  Component Value Date   INSULIN 6.2 02/03/2020   INSULIN 19.0 05/28/2019   Lab Results  Component Value Date   TSH 0.346 (L) 02/03/2020   Lab Results  Component Value Date   CHOL 127 11/03/2020   HDL 47.70 11/03/2020   LDLCALC 63 11/03/2020   TRIG 80.0 11/03/2020   CHOLHDL 3 11/03/2020   Lab Results  Component Value Date   WBC 10.3 04/21/2021   HGB 14.9 04/21/2021   HCT 44.1 04/21/2021   MCV 88.9 04/21/2021   PLT 209 04/21/2021   No results found for: IRON, TIBC, FERRITIN Lab Results  Component Value Date   VD25OH 97.9 03/22/2021   VD25OH 65.6 02/03/2020   VD25OH 49.7 05/28/2019    Attestation Statements:   Reviewed by clinician on day of visit: allergies, medications, problem list, medical history, surgical history, family history, social history, and previous encounter notes.

## 2021-10-21 ENCOUNTER — Encounter (INDEPENDENT_AMBULATORY_CARE_PROVIDER_SITE_OTHER): Payer: Self-pay | Admitting: Family Medicine

## 2021-10-21 ENCOUNTER — Other Ambulatory Visit (HOSPITAL_BASED_OUTPATIENT_CLINIC_OR_DEPARTMENT_OTHER): Payer: Self-pay

## 2021-10-21 ENCOUNTER — Telehealth (INDEPENDENT_AMBULATORY_CARE_PROVIDER_SITE_OTHER): Payer: 59 | Admitting: Family Medicine

## 2021-10-21 DIAGNOSIS — Z6830 Body mass index (BMI) 30.0-30.9, adult: Secondary | ICD-10-CM

## 2021-10-21 DIAGNOSIS — E669 Obesity, unspecified: Secondary | ICD-10-CM

## 2021-10-21 DIAGNOSIS — E8881 Metabolic syndrome: Secondary | ICD-10-CM

## 2021-10-21 DIAGNOSIS — E88819 Insulin resistance, unspecified: Secondary | ICD-10-CM

## 2021-10-21 DIAGNOSIS — F3289 Other specified depressive episodes: Secondary | ICD-10-CM | POA: Diagnosis not present

## 2021-10-21 MED ORDER — WEGOVY 2.4 MG/0.75ML ~~LOC~~ SOAJ
2.4000 mg | SUBCUTANEOUS | 0 refills | Status: DC
  Filled 2021-10-21: qty 3, 28d supply, fill #0

## 2021-11-11 ENCOUNTER — Ambulatory Visit (INDEPENDENT_AMBULATORY_CARE_PROVIDER_SITE_OTHER): Payer: 59 | Admitting: Family Medicine

## 2021-11-11 ENCOUNTER — Encounter (INDEPENDENT_AMBULATORY_CARE_PROVIDER_SITE_OTHER): Payer: Self-pay | Admitting: Family Medicine

## 2021-11-11 ENCOUNTER — Other Ambulatory Visit (HOSPITAL_BASED_OUTPATIENT_CLINIC_OR_DEPARTMENT_OTHER): Payer: Self-pay

## 2021-11-11 VITALS — BP 130/74 | HR 71 | Temp 97.6°F | Ht 68.0 in | Wt 202.0 lb

## 2021-11-11 DIAGNOSIS — Z6830 Body mass index (BMI) 30.0-30.9, adult: Secondary | ICD-10-CM | POA: Diagnosis not present

## 2021-11-11 DIAGNOSIS — E669 Obesity, unspecified: Secondary | ICD-10-CM | POA: Diagnosis not present

## 2021-11-11 DIAGNOSIS — E038 Other specified hypothyroidism: Secondary | ICD-10-CM | POA: Diagnosis not present

## 2021-11-11 DIAGNOSIS — E559 Vitamin D deficiency, unspecified: Secondary | ICD-10-CM

## 2021-11-11 DIAGNOSIS — E8881 Metabolic syndrome: Secondary | ICD-10-CM

## 2021-11-11 DIAGNOSIS — F39 Unspecified mood [affective] disorder: Secondary | ICD-10-CM | POA: Diagnosis not present

## 2021-11-11 DIAGNOSIS — Z9189 Other specified personal risk factors, not elsewhere classified: Secondary | ICD-10-CM

## 2021-11-11 MED ORDER — WEGOVY 2.4 MG/0.75ML ~~LOC~~ SOAJ
2.4000 mg | SUBCUTANEOUS | 0 refills | Status: DC
  Filled 2021-11-11 – 2021-11-15 (×2): qty 3, 28d supply, fill #0

## 2021-11-11 MED ORDER — VITAMIN D (ERGOCALCIFEROL) 1.25 MG (50000 UNIT) PO CAPS
50000.0000 [IU] | ORAL_CAPSULE | ORAL | 0 refills | Status: DC
  Filled 2021-11-11: qty 4, 28d supply, fill #0

## 2021-11-12 ENCOUNTER — Other Ambulatory Visit (HOSPITAL_BASED_OUTPATIENT_CLINIC_OR_DEPARTMENT_OTHER): Payer: Self-pay

## 2021-11-12 ENCOUNTER — Other Ambulatory Visit: Payer: Self-pay | Admitting: Family Medicine

## 2021-11-12 LAB — COMPREHENSIVE METABOLIC PANEL
ALT: 32 IU/L (ref 0–44)
AST: 31 IU/L (ref 0–40)
Albumin/Globulin Ratio: 2.1 (ref 1.2–2.2)
Albumin: 4.8 g/dL (ref 4.1–5.2)
Alkaline Phosphatase: 68 IU/L (ref 44–121)
BUN/Creatinine Ratio: 14 (ref 9–20)
BUN: 15 mg/dL (ref 6–20)
Bilirubin Total: 0.5 mg/dL (ref 0.0–1.2)
CO2: 22 mmol/L (ref 20–29)
Calcium: 9.7 mg/dL (ref 8.7–10.2)
Chloride: 102 mmol/L (ref 96–106)
Creatinine, Ser: 1.07 mg/dL (ref 0.76–1.27)
Globulin, Total: 2.3 g/dL (ref 1.5–4.5)
Glucose: 80 mg/dL (ref 70–99)
Potassium: 4.1 mmol/L (ref 3.5–5.2)
Sodium: 140 mmol/L (ref 134–144)
Total Protein: 7.1 g/dL (ref 6.0–8.5)
eGFR: 99 mL/min/{1.73_m2} (ref >59)

## 2021-11-12 LAB — VITAMIN D 25 HYDROXY (VIT D DEFICIENCY, FRACTURES): Vit D, 25-Hydroxy: 54.6 ng/mL (ref 30.0–100.0)

## 2021-11-12 LAB — HEMOGLOBIN A1C
Est. average glucose Bld gHb Est-mCnc: 88 mg/dL
Hgb A1c MFr Bld: 4.7 % — ABNORMAL LOW (ref 4.8–5.6)

## 2021-11-12 LAB — INSULIN, RANDOM: INSULIN: 16.5 u[IU]/mL (ref 2.6–24.9)

## 2021-11-12 MED ORDER — LINACLOTIDE 290 MCG PO CAPS
ORAL_CAPSULE | ORAL | 2 refills | Status: DC
  Filled 2021-11-12: qty 30, 30d supply, fill #0
  Filled 2022-01-11: qty 30, 30d supply, fill #1
  Filled 2022-02-08: qty 30, 30d supply, fill #2

## 2021-11-15 ENCOUNTER — Other Ambulatory Visit (HOSPITAL_BASED_OUTPATIENT_CLINIC_OR_DEPARTMENT_OTHER): Payer: Self-pay

## 2021-11-16 ENCOUNTER — Other Ambulatory Visit (HOSPITAL_BASED_OUTPATIENT_CLINIC_OR_DEPARTMENT_OTHER): Payer: Self-pay

## 2021-11-16 DIAGNOSIS — F39 Unspecified mood [affective] disorder: Secondary | ICD-10-CM | POA: Insufficient documentation

## 2021-11-16 NOTE — Progress Notes (Addendum)
Chief Complaint:   OBESITY Albert [name] is here to discuss his progress with his obesity treatment plan along with follow-up of his obesity related diagnoses. Albert [name] is on keeping a food journal and adhering to recommended goals of 2000 calories and 130 protein and states he is following his eating plan approximately 60% of the time. Albert [name] states he is doing cardio and resistance bands 80 minutes 4 times per week.  Today's visit was #: 35 Starting weight: 236 lbs Starting date: 05/28/2019 Today's weight: 202 lbs Today's date: 11/11/2021 Total lbs lost to date: 34 lbs Total lbs lost since last in-office visit: 0  Interim History: Albert [name] states that he is journaling everyday but not inputting all the meals or foods he eats.  This is his second OV with me.  He feels he can do better and would like to lose more.  He feels the key for him would be in meal prepping to be successful.  WHich we discussed strategies for that in detail.   Subjective:   1. Insulin resistance Albert [name] is on Wegovy for weight loss and treatment of obesity, but also to counter act his psychiatry medications.    2. Vitamin D deficiency He is currently taking prescription vitamin D 50,000 IU each week. He denies nausea, vomiting or muscle weakness.  3. Other specified hypothyroidism Albert [name] has an Endocrinology doctor that manages his thyroid etc.  Albert [name] gets labs every 6 months.  4. Mood disorder Albert [name] [facility]) Sees psychiatric Albert Arsenio Katz at [facility] every 6 weeks.  Assessment/Plan:   Orders Placed This Encounter  Procedures   Comprehensive metabolic panel   Hemoglobin A1c   Insulin, random   VITAMIN D 25 Hydroxy (Vit-D Deficiency, Fractures)    Medications Discontinued During This Encounter  Medication Reason   Semaglutide-Weight Management (WEGOVY) 2.4 MG/0.75ML SOAJ Reorder   Cholecalciferol (VITAMIN D) 125 MCG (5000 UT) CAPS      Meds ordered this encounter  Medications   Semaglutide-Weight Management  (WEGOVY) 2.4 MG/0.75ML SOAJ    Sig: Inject 2.4 mg into the skin once a week.    Dispense:  3 mL    Refill:  0   Vitamin D, Ergocalciferol, (DRISDOL) 1.25 MG (50000 UNIT) CAPS capsule    Sig: Take 1 capsule (50,000 Units total) by mouth every 7 (seven) days.    Dispense:  4 capsule    Refill:  0     1. Insulin resistance I counseled patient on pathophysiology of the disease process of I.R.  Stressed importance of dietary and lifestyle modifications to result in weight loss as first line txmnt Continue to decrease simple carbs; increase fiber and proteins -> follow meal plan  All concerns/questions addressed, and anticipatory guidance given.   - We will recheck A1c and fasting insulin level in approximately 3 months from last check, or as deemed appropriate.  Check labs, see below.  Refill - Semaglutide-Weight Management (WEGOVY) 2.4 MG/0.75ML SOAJ; Inject 2.4 mg into the skin once a week.  Dispense: 3 mL; Refill: 0  - Comprehensive metabolic panel - Hemoglobin A1c - Insulin, random  2. Vitamin D deficiency Discontinue OTC Vitamin D,  Jadarien wishes to change to weekly ergocalciferol.  Start- Vitamin D, Ergocalciferol, (DRISDOL) 1.25 MG (50000 UNIT) CAPS capsule; Take 1 capsule (50,000 Units total) by mouth every 7 (seven) days.  Dispense: 4 capsule; Refill: 0 - VITAMIN D 25 Hydroxy (Vit-D Deficiency, Fractures)  3. Other specified hypothyroidism Diagnosis and medication management per Albert [name], follow up  with Albert [name] as directed.  4. Mood disorder (HCC) Stable, continue management per [facility] psychiatry.  5. At risk for hypoglycemia  Albert [name] was given approximately 9 minutes of counseling today regarding prevention of hypoglycemia.  He was advised of symptoms of hypoglycemia.  Albert [name] was instructed to avoid skipping meals and to eat regular protein-rich meals as prescribed on the meal plan.  The patient should have readily available low calorie snacks as needed, such as [employer] fruit  snack packs, if blood sugar goes too low.    6. Obesity with current BMI of 30.8 1) He will focus on meal prep. 2) Recipe packet given. 3) I recommend accurate journaling.  Refill- Semaglutide-Weight Management (WEGOVY) 2.4 MG/0.75ML SOAJ; Inject 2.4 mg into the skin once a week.  Dispense: 3 mL; Refill: 0  St is currently in the action stage of change. As such, his goal is to continue with weight loss efforts. He has agreed to keeping a food journal and adhering to recommended goals of 1900-2000 calories and 155+ protein daily.   Exercise goals:  As is.  Behavioral modification strategies: increasing lean protein intake, decreasing simple carbohydrates, and keeping a strict food journal.  Albert [name] has agreed to follow-up with our clinic in 3 weeks with Albert [name].  He was informed of the importance of frequent follow-up visits to maximize his success with intensive lifestyle modifications for his multiple health conditions.   Albert [name] was informed we would discuss his lab results at his next visit unless there is a critical issue that needs to be addressed sooner. Albert [name] agreed to keep his next visit at the agreed upon time to discuss these results.  Objective:   Blood pressure 130/74, pulse 71, temperature 97.6 F (36.4 C), height 5\' 8"  (1.727 m), weight 202 lb (91.6 kg), SpO2 98 %. Body mass index is 30.71 kg/m.  General: Cooperative, alert, well developed, in no acute distress. HEENT: Conjunctivae and lids unremarkable. Cardiovascular: Regular rhythm.  Lungs: Normal work of breathing. Neurologic: No focal deficits.   Lab Results  Component Value Date   CREATININE 1.07 11/11/2021   BUN 15 11/11/2021   NA 140 11/11/2021   K 4.1 11/11/2021   CL 102 11/11/2021   CO2 22 11/11/2021   Lab Results  Component Value Date   ALT 32 11/11/2021   AST 31 11/11/2021   ALKPHOS 68 11/11/2021   BILITOT 0.5 11/11/2021   Lab Results  Component Value Date   HGBA1C 4.7 (L)  11/11/2021   HGBA1C 5.0 02/03/2020   HGBA1C 4.8 05/28/2019   Lab Results  Component Value Date   INSULIN 16.5 11/11/2021   INSULIN 6.2 02/03/2020   INSULIN 19.0 05/28/2019   Lab Results  Component Value Date   TSH 0.346 (L) 02/03/2020   Lab Results  Component Value Date   CHOL 127 11/03/2020   HDL 47.70 11/03/2020   LDLCALC 63 11/03/2020   TRIG 80.0 11/03/2020   CHOLHDL 3 11/03/2020   Lab Results  Component Value Date   VD25OH 54.6 11/11/2021   VD25OH 97.9 03/22/2021   VD25OH 65.6 02/03/2020   Lab Results  Component Value Date   WBC 10.3 04/21/2021   HGB 14.9 04/21/2021   HCT 44.1 04/21/2021   MCV 88.9 04/21/2021   PLT 209 04/21/2021   No results found for: "IRON", "TIBC", "FERRITIN"  Attestation Statements:   Reviewed by clinician on day of visit: allergies, medications, problem list, medical history, surgical history, family history, social history, and previous encounter notes.  I, Albert [name], RMA, am acting as Energy manager for [employer], DO.  I have reviewed the above documentation for accuracy and completeness, and I agree with the above. -   I have reviewed the above documentation for accuracy and completeness, and I agree with the above. Albert [name], D.O.  The 21st Century Cures Act was signed into law in 2016 which includes the topic of electronic health records.  This provides immediate access to information in MyChart.  This includes consultation notes, operative notes, office notes, lab results and pathology reports.  If you have any questions about what you read please let us know at your next visit so we can discuss your concerns and take corrective action if need be.  We are right here with you.

## 2021-11-17 ENCOUNTER — Other Ambulatory Visit (HOSPITAL_BASED_OUTPATIENT_CLINIC_OR_DEPARTMENT_OTHER): Payer: Self-pay

## 2021-11-17 MED ORDER — DOXYCYCLINE MONOHYDRATE 100 MG PO CAPS
ORAL_CAPSULE | ORAL | 2 refills | Status: DC
  Filled 2021-11-17: qty 5, 5d supply, fill #0
  Filled 2021-11-17: qty 25, 25d supply, fill #0
  Filled 2021-12-20: qty 30, 30d supply, fill #1
  Filled 2022-01-14: qty 30, 30d supply, fill #2

## 2021-11-18 ENCOUNTER — Other Ambulatory Visit (HOSPITAL_BASED_OUTPATIENT_CLINIC_OR_DEPARTMENT_OTHER): Payer: Self-pay

## 2021-11-30 DIAGNOSIS — F3341 Major depressive disorder, recurrent, in partial remission: Secondary | ICD-10-CM | POA: Diagnosis not present

## 2021-12-01 NOTE — Progress Notes (Unsigned)
TeleHealth Visit:  This visit was completed with telemedicine (audio/video) technology. [name] has verbally consented to this TeleHealth visit. The patient is located at home, the provider is located at home. The participants in this visit include the listed provider and patient. The visit was conducted today via MyChart video.  OBESITY Albert [name] is here to discuss his progress with his obesity treatment plan along with follow-up of his obesity related diagnoses.   Today's visit was # 58 Starting weight: 236 lbs Starting date: 05/28/2019 Weight at last in office visit: 202 lbs on 11/11/21 Total weight loss: 34 lbs at last in office visit on 11/11/21. Today's reported weight: 199 lbs   Nutrition Plan: keeping a food journal and adhering to recommended goals of 1900-2000 calories and 155+ gms protein.  Hunger is well controlled.  Current exercise:  Running and lifting weights at the gym for 75 minutes 3 to 4 days/week.  Interim History: Albert [name] was very happy to be below 200 pounds. 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  However he is journaling 5 to 6 days/week and meeting protein goals most days.  He reports sometimes he meets the protein goal using protein bars due to convenience at work.  He is meal prepping more however.  Assessment/Plan:  1. Vitamin D Deficiency Vitamin D is at goal of 50.  Last vitamin D level was 54.6 on 11/11/2021. He is on weekly prescription Vitamin D 50,000 IU.  Lab Results  Component Value Date   VD25OH 54.6 11/11/2021   VD25OH 97.9 03/22/2021   VD25OH 65.6 02/03/2020    Plan: Refill prescription vitamin D 50,000 IU weekly.   2. Insulin Resistance Recent fasting insulin level was elevated at 16.5 on 11/11/2021.  A1c is 4.7. Medication(s): Wegovy 2.4 mg weekly.  He reports appetite is well controlled. Lab Results  Component Value Date   HGBA1C 4.7 (L) 11/11/2021   Lab Results  Component Value Date    INSULIN 16.5 11/11/2021   INSULIN 6.2 02/03/2020   INSULIN 19.0 05/28/2019    Plan Refill Wegovy 2.4 mg weekly.  3.  Mood disorder Kathy suffers from depression.  He sees Dr. Jenny Reichmann Beyer/psychiatry at [facility] every 6 weeks.  He reports recent exacerbation of his depression.  He is going through a great deal of stress acclimating to his new job.  He saw Dr. Melrose Nakayama yesterday and he referred him to a clinic that does ECT/TMS.  Sevrin says he would not do ECT at this time but he would consider doing [place].  He has also signed up for a 2 year research study using "dynamic psychotherapy".  Larry denies suicidal/homicidal ideation at this time.  Plan: Continue all medications as prescribed by psychiatry. Follow-up with therapist and Dr. Melrose Nakayama as directed.  4. Obesity: Current BMI 30.8 Albert [name] is currently in the action stage of change. As such, his goal is to continue with weight loss efforts.  He has agreed to keeping a food journal and adhering to recommended goals of 1900-2000 calories and 155+ gms protein. Albert [name]   Exercise goals: as is  Behavioral modification strategies: increasing lean protein intake, planning for success, and keeping a strict food journal.  Albert [name] has agreed to follow-up with our clinic in 4 weeks.   No orders of the defined types were placed in this encounter.   Medications Discontinued During This Encounter  Medication Reason   Semaglutide-Weight Management (WEGOVY) 2.4 MG/0.75ML SOAJ Reorder   Vitamin D,  Ergocalciferol, (DRISDOL) 1.25 MG (50000 UNIT) CAPS capsule Reorder     Meds ordered this encounter  Medications   Semaglutide-Weight Management (WEGOVY) 2.4 MG/0.75ML SOAJ    Sig: Inject 2.4 mg into the skin once a week.    Dispense:  3 mL    Refill:  0    Order Specific Question:   Supervising Provider    Answer:   Dennard Nip D [[id number]]   Vitamin D, Ergocalciferol, (DRISDOL) 1.25 MG (50000 UNIT) CAPS capsule    Sig: Take 1 capsule (50,000 Units total) by  mouth every 7 (seven) days.    Dispense:  4 capsule    Refill:  0    Order Specific Question:   Supervising Provider    Answer:   Dennard Nip D [[id number]]      Objective:   VITALS: Per patient if applicable, see vitals. GENERAL: Alert and in no acute distress. CARDIOPULMONARY: No increased WOB. Speaking in clear sentences.  PSYCH: Pleasant and cooperative. Speech normal rate and rhythm. Affect is appropriate. Insight and judgement are appropriate. Attention is focused, linear, and appropriate.  NEURO: Oriented as arrived to appointment on time with no prompting.   Lab Results  Component Value Date   CREATININE 1.07 11/11/2021   BUN 15 11/11/2021   NA 140 11/11/2021   K 4.1 11/11/2021   CL 102 11/11/2021   CO2 22 11/11/2021   Lab Results  Component Value Date   ALT 32 11/11/2021   AST 31 11/11/2021   ALKPHOS 68 11/11/2021   BILITOT 0.5 11/11/2021   Lab Results  Component Value Date   HGBA1C 4.7 (L) 11/11/2021   HGBA1C 5.0 02/03/2020   HGBA1C 4.8 05/28/2019   Lab Results  Component Value Date   INSULIN 16.5 11/11/2021   INSULIN 6.2 02/03/2020   INSULIN 19.0 05/28/2019   Lab Results  Component Value Date   TSH 0.346 (L) 02/03/2020   Lab Results  Component Value Date   CHOL 127 11/03/2020   HDL 47.70 11/03/2020   LDLCALC 63 11/03/2020   TRIG 80.0 11/03/2020   CHOLHDL 3 11/03/2020   Lab Results  Component Value Date   WBC 10.3 04/21/2021   HGB 14.9 04/21/2021   HCT 44.1 04/21/2021   MCV 88.9 04/21/2021   PLT 209 04/21/2021   No results found for: "IRON", "TIBC", "FERRITIN" Lab Results  Component Value Date   VD25OH 54.6 11/11/2021   VD25OH 97.9 03/22/2021   VD25OH 65.6 02/03/2020    Attestation Statements:   Reviewed by clinician on day of visit: allergies, medications, problem list, medical history, surgical history, family history, social history, and previous encounter notes.

## 2021-12-02 ENCOUNTER — Telehealth (INDEPENDENT_AMBULATORY_CARE_PROVIDER_SITE_OTHER): Payer: 59 | Admitting: Family Medicine

## 2021-12-02 ENCOUNTER — Other Ambulatory Visit (HOSPITAL_BASED_OUTPATIENT_CLINIC_OR_DEPARTMENT_OTHER): Payer: Self-pay

## 2021-12-02 ENCOUNTER — Encounter (INDEPENDENT_AMBULATORY_CARE_PROVIDER_SITE_OTHER): Payer: Self-pay | Admitting: Family Medicine

## 2021-12-02 DIAGNOSIS — F39 Unspecified mood [affective] disorder: Secondary | ICD-10-CM

## 2021-12-02 DIAGNOSIS — E88819 Insulin resistance, unspecified: Secondary | ICD-10-CM

## 2021-12-02 DIAGNOSIS — E8881 Metabolic syndrome: Secondary | ICD-10-CM | POA: Diagnosis not present

## 2021-12-02 DIAGNOSIS — Z6830 Body mass index (BMI) 30.0-30.9, adult: Secondary | ICD-10-CM

## 2021-12-02 DIAGNOSIS — E669 Obesity, unspecified: Secondary | ICD-10-CM | POA: Diagnosis not present

## 2021-12-02 DIAGNOSIS — E559 Vitamin D deficiency, unspecified: Secondary | ICD-10-CM

## 2021-12-02 MED ORDER — VITAMIN D (ERGOCALCIFEROL) 1.25 MG (50000 UNIT) PO CAPS
50000.0000 [IU] | ORAL_CAPSULE | ORAL | 0 refills | Status: DC
Start: 2021-12-02 — End: 2022-01-17
  Filled 2021-12-02 – 2021-12-03 (×2): qty 4, 28d supply, fill #0

## 2021-12-02 MED ORDER — WEGOVY 2.4 MG/0.75ML ~~LOC~~ SOAJ
2.4000 mg | SUBCUTANEOUS | 0 refills | Status: DC
  Filled 2021-12-02 – 2021-12-08 (×2): qty 3, 28d supply, fill #0

## 2021-12-03 ENCOUNTER — Other Ambulatory Visit (HOSPITAL_BASED_OUTPATIENT_CLINIC_OR_DEPARTMENT_OTHER): Payer: Self-pay

## 2021-12-08 ENCOUNTER — Other Ambulatory Visit (HOSPITAL_BASED_OUTPATIENT_CLINIC_OR_DEPARTMENT_OTHER): Payer: Self-pay

## 2021-12-09 DIAGNOSIS — E039 Hypothyroidism, unspecified: Secondary | ICD-10-CM | POA: Diagnosis not present

## 2021-12-20 ENCOUNTER — Other Ambulatory Visit (HOSPITAL_BASED_OUTPATIENT_CLINIC_OR_DEPARTMENT_OTHER): Payer: Self-pay

## 2021-12-21 ENCOUNTER — Other Ambulatory Visit (HOSPITAL_BASED_OUTPATIENT_CLINIC_OR_DEPARTMENT_OTHER): Payer: Self-pay

## 2021-12-21 DIAGNOSIS — F32A Depression, unspecified: Secondary | ICD-10-CM | POA: Diagnosis not present

## 2021-12-24 ENCOUNTER — Other Ambulatory Visit (HOSPITAL_BASED_OUTPATIENT_CLINIC_OR_DEPARTMENT_OTHER): Payer: Self-pay

## 2021-12-27 ENCOUNTER — Other Ambulatory Visit (HOSPITAL_BASED_OUTPATIENT_CLINIC_OR_DEPARTMENT_OTHER): Payer: Self-pay

## 2021-12-28 ENCOUNTER — Other Ambulatory Visit (HOSPITAL_BASED_OUTPATIENT_CLINIC_OR_DEPARTMENT_OTHER): Payer: Self-pay

## 2021-12-29 ENCOUNTER — Encounter (INDEPENDENT_AMBULATORY_CARE_PROVIDER_SITE_OTHER): Payer: Self-pay

## 2021-12-29 ENCOUNTER — Ambulatory Visit (INDEPENDENT_AMBULATORY_CARE_PROVIDER_SITE_OTHER): Payer: 59 | Admitting: Family Medicine

## 2021-12-31 ENCOUNTER — Encounter: Payer: Self-pay | Admitting: Family Medicine

## 2021-12-31 ENCOUNTER — Other Ambulatory Visit (HOSPITAL_BASED_OUTPATIENT_CLINIC_OR_DEPARTMENT_OTHER): Payer: Self-pay

## 2021-12-31 ENCOUNTER — Ambulatory Visit: Payer: 59 | Admitting: Family Medicine

## 2021-12-31 VITALS — BP 118/62 | HR 74 | Temp 97.8°F | Ht 69.0 in | Wt 205.5 lb

## 2021-12-31 DIAGNOSIS — J302 Other seasonal allergic rhinitis: Secondary | ICD-10-CM

## 2021-12-31 DIAGNOSIS — H6591 Unspecified nonsuppurative otitis media, right ear: Secondary | ICD-10-CM | POA: Diagnosis not present

## 2021-12-31 DIAGNOSIS — L0291 Cutaneous abscess, unspecified: Secondary | ICD-10-CM | POA: Diagnosis not present

## 2021-12-31 MED ORDER — PREDNISONE 20 MG PO TABS
40.0000 mg | ORAL_TABLET | Freq: Every day | ORAL | 0 refills | Status: AC
  Filled 2021-12-31: qty 10, 5d supply, fill #0

## 2021-12-31 MED ORDER — MONTELUKAST SODIUM 10 MG PO TABS
10.0000 mg | ORAL_TABLET | Freq: Every day | ORAL | 3 refills | Status: DC
  Filled 2021-12-31: qty 30, 30d supply, fill #0
  Filled 2022-02-08: qty 30, 30d supply, fill #1

## 2021-12-31 NOTE — Patient Instructions (Addendum)
If you do not hear anything about your referral in the next 1-2 weeks, call our office and ask for an update.  Continue your allergy medication regimen.   Let us know if you need anything.

## 2021-12-31 NOTE — Progress Notes (Signed)
Chief Complaint  Patient presents with   Otitis Media    Right     Pt is here for right ear pressure. Duration: 7 days Progression: unchanged Associated symptoms: congestion, ear pressure, and non productive cough Denies: sore throat, sneezing, fevers, achiness, bleeding, or discharge from ear Treatment to date: Claritin, Mucinex, Flonase The patient has also had a several month history of chronic nasal drainage, coughing, and nasal stuffiness.  Allergy medication regimen as above.  It does not seem to be helping.  Patient has a 56-monthhistory of a draining lesion on his left buttock.  No current fevers, pain, itchiness.  No new lotions, soaps, topicals, or detergents.  He has not tried anything so far.  He is interested in having it taken care of permanently.  Past Medical History:  Diagnosis Date   Anxiety with depression 09/30/2016   Bipolar disorder (HCC)    Constipation    History of PSVT (paroxysmal supraventricular tachycardia)    Hyperhidrosis    IBS (irritable bowel syndrome)    Joint pain    Low back pain    Major depression, melancholic type    Schizophrenia (HCC)    Thyroid disorder    Vitamin D deficiency     BP 118/62   Pulse 74   Temp 97.8 F (36.6 C) (Oral)   Ht '5\' 9"'$  (1.753 m)   Wt 205 lb 8 oz (93.2 kg)   SpO2 97%   BMI 30.35 kg/m  General: Awake, alert, appearing stated age HEENT:  L ear- Canal patent without drainage or erythema, TM is negative R ear- canal patent without drainage or erythema, TM is slightly bulging with straw-colored fluid present Nose- nares patent and without discharge Mouth- Lips, gums and dentition unremarkable, pharynx is without erythema or exudate Neck: No adenopathy Skin: Purplish papule on the left proximal gluteus overlying a circular area of induration without active drainage or fluctuance; no erythema, excessive warmth, TTP Lungs: Normal effort, no accessory muscle use Psych: Age appropriate judgment and insight,  normal mood and affect  Fluid level behind tympanic membrane of right ear - Plan: predniSONE (DELTASONE) 20 MG tablet  Abscess - Plan: Ambulatory referral to General Surgery  Seasonal allergies  5-day prednisone burst 40 mg daily, okay to continue allergy medication regimen. Refer to general surgery Chronic, uncontrolled.  Continue Claritin and Flonase.  Add Singulair 10 mg daily.  He just stops doxycycline for acne issues.  He was reading online that this could be causing the issues.  I told him I have not heard that but if it is the case, he will know in a couple weeks. F/u as originally scheduled. Pt voiced understanding and agreement to the plan.  NSchlater DO 12/31/21 3:07 PM

## 2022-01-03 ENCOUNTER — Encounter: Payer: Self-pay | Admitting: Family Medicine

## 2022-01-03 NOTE — Telephone Encounter (Signed)
Pt called back regarding mychart msg.

## 2022-01-05 ENCOUNTER — Telehealth (HOSPITAL_COMMUNITY): Payer: Self-pay | Admitting: Psychiatry

## 2022-01-05 NOTE — Telephone Encounter (Signed)
D:  Patient called stating he's interested in [place].  States his doctor (Dr. Melrose Nakayama) from [facility] is referring him.  Pt states Dr. Melrose Nakayama will be sending a referral over with chart information.  A:  Answered pt's questions.  Informed pt the interim coordinator could go ahead and start the process while awaiting an opening for the treatments.  R:  Pt receptive.

## 2022-01-06 ENCOUNTER — Other Ambulatory Visit (HOSPITAL_BASED_OUTPATIENT_CLINIC_OR_DEPARTMENT_OTHER): Payer: Self-pay

## 2022-01-06 ENCOUNTER — Other Ambulatory Visit (INDEPENDENT_AMBULATORY_CARE_PROVIDER_SITE_OTHER): Payer: Self-pay | Admitting: Family Medicine

## 2022-01-06 DIAGNOSIS — E669 Obesity, unspecified: Secondary | ICD-10-CM

## 2022-01-06 DIAGNOSIS — E8881 Metabolic syndrome: Secondary | ICD-10-CM

## 2022-01-07 ENCOUNTER — Telehealth (HOSPITAL_COMMUNITY): Payer: Self-pay | Admitting: Psychiatry

## 2022-01-07 ENCOUNTER — Other Ambulatory Visit (HOSPITAL_BASED_OUTPATIENT_CLINIC_OR_DEPARTMENT_OTHER): Payer: Self-pay

## 2022-01-07 DIAGNOSIS — H66001 Acute suppurative otitis media without spontaneous rupture of ear drum, right ear: Secondary | ICD-10-CM | POA: Diagnosis not present

## 2022-01-07 NOTE — Telephone Encounter (Signed)
D:  Placed call to inform pt about his consultation appt with Dr. Kai Levins on 01-31-22 @ 9 a.m., but there was no answer.  A:  Left vm with appt information.  Informed pt to arrive 20-30 minutes early for paperwork and to bring his insurance card.  Provided pt with the address and interim coordinator's phone number.

## 2022-01-10 ENCOUNTER — Telehealth (HOSPITAL_COMMUNITY): Payer: Self-pay | Admitting: Psychiatry

## 2022-01-10 ENCOUNTER — Telehealth (INDEPENDENT_AMBULATORY_CARE_PROVIDER_SITE_OTHER): Payer: Self-pay | Admitting: Family Medicine

## 2022-01-10 ENCOUNTER — Other Ambulatory Visit: Payer: Self-pay | Admitting: Family Medicine

## 2022-01-10 ENCOUNTER — Other Ambulatory Visit (HOSPITAL_BASED_OUTPATIENT_CLINIC_OR_DEPARTMENT_OTHER): Payer: Self-pay

## 2022-01-10 DIAGNOSIS — E669 Obesity, unspecified: Secondary | ICD-10-CM

## 2022-01-10 DIAGNOSIS — E88819 Insulin resistance, unspecified: Secondary | ICD-10-CM

## 2022-01-10 DIAGNOSIS — L0291 Cutaneous abscess, unspecified: Secondary | ICD-10-CM

## 2022-01-10 DIAGNOSIS — E8881 Metabolic syndrome: Secondary | ICD-10-CM

## 2022-01-10 NOTE — Telephone Encounter (Signed)
Pt called to report 2 pens malfunctioned. WEGOVY. Pt states he called the drug company and was on hold for over half an hour. Please advise.

## 2022-01-11 ENCOUNTER — Encounter (HOSPITAL_BASED_OUTPATIENT_CLINIC_OR_DEPARTMENT_OTHER): Payer: Self-pay

## 2022-01-11 ENCOUNTER — Other Ambulatory Visit (HOSPITAL_BASED_OUTPATIENT_CLINIC_OR_DEPARTMENT_OTHER): Payer: Self-pay

## 2022-01-11 MED ORDER — WEGOVY 2.4 MG/0.75ML ~~LOC~~ SOAJ
2.4000 mg | SUBCUTANEOUS | 0 refills | Status: DC
  Filled 2022-01-11: qty 3, 28d supply, fill #0

## 2022-01-12 ENCOUNTER — Other Ambulatory Visit (HOSPITAL_BASED_OUTPATIENT_CLINIC_OR_DEPARTMENT_OTHER): Payer: Self-pay

## 2022-01-13 ENCOUNTER — Other Ambulatory Visit (HOSPITAL_BASED_OUTPATIENT_CLINIC_OR_DEPARTMENT_OTHER): Payer: Self-pay

## 2022-01-14 ENCOUNTER — Other Ambulatory Visit (HOSPITAL_BASED_OUTPATIENT_CLINIC_OR_DEPARTMENT_OTHER): Payer: Self-pay

## 2022-01-17 ENCOUNTER — Other Ambulatory Visit (HOSPITAL_BASED_OUTPATIENT_CLINIC_OR_DEPARTMENT_OTHER): Payer: Self-pay

## 2022-01-17 ENCOUNTER — Encounter (INDEPENDENT_AMBULATORY_CARE_PROVIDER_SITE_OTHER): Payer: Self-pay | Admitting: Family Medicine

## 2022-01-17 ENCOUNTER — Institutional Professional Consult (permissible substitution) (HOSPITAL_COMMUNITY): Payer: Self-pay | Admitting: Student in an Organized Health Care Education/Training Program

## 2022-01-17 ENCOUNTER — Telehealth (HOSPITAL_COMMUNITY): Payer: Self-pay | Admitting: Psychiatry

## 2022-01-17 ENCOUNTER — Ambulatory Visit (HOSPITAL_BASED_OUTPATIENT_CLINIC_OR_DEPARTMENT_OTHER): Payer: 59 | Admitting: Student in an Organized Health Care Education/Training Program

## 2022-01-17 ENCOUNTER — Encounter (INDEPENDENT_AMBULATORY_CARE_PROVIDER_SITE_OTHER): Payer: Self-pay | Admitting: *Deleted

## 2022-01-17 ENCOUNTER — Ambulatory Visit (INDEPENDENT_AMBULATORY_CARE_PROVIDER_SITE_OTHER): Payer: 59 | Admitting: Family Medicine

## 2022-01-17 VITALS — BP 106/69 | HR 64 | Temp 98.1°F | Ht 69.0 in | Wt 207.0 lb

## 2022-01-17 DIAGNOSIS — Z6831 Body mass index (BMI) 31.0-31.9, adult: Secondary | ICD-10-CM | POA: Diagnosis not present

## 2022-01-17 DIAGNOSIS — E8881 Metabolic syndrome: Secondary | ICD-10-CM

## 2022-01-17 DIAGNOSIS — E559 Vitamin D deficiency, unspecified: Secondary | ICD-10-CM | POA: Diagnosis not present

## 2022-01-17 DIAGNOSIS — F411 Generalized anxiety disorder: Secondary | ICD-10-CM | POA: Diagnosis not present

## 2022-01-17 DIAGNOSIS — E669 Obesity, unspecified: Secondary | ICD-10-CM | POA: Diagnosis not present

## 2022-01-17 DIAGNOSIS — F332 Major depressive disorder, recurrent severe without psychotic features: Secondary | ICD-10-CM | POA: Diagnosis not present

## 2022-01-17 DIAGNOSIS — F39 Unspecified mood [affective] disorder: Secondary | ICD-10-CM

## 2022-01-17 MED ORDER — VITAMIN D (ERGOCALCIFEROL) 1.25 MG (50000 UNIT) PO CAPS
50000.0000 [IU] | ORAL_CAPSULE | ORAL | 0 refills | Status: DC
  Filled 2022-01-17: qty 4, 28d supply, fill #0

## 2022-01-17 NOTE — Progress Notes (Signed)
[facility] MD/PA/NP OP Progress Note  01/17/2022 10:14 AM BRYNE LINDON  MRN:  [id number]  Chief Complaint:  Chief Complaint  Patient presents with   [place] Consult   HPI:  Cammeron Greis. Carra is a 26 yr old male who presents for [place] Consult.  PPHx is significant for MDD, Recurrent, GAD, and discussions of Cyclothimia, Bipolar Disorder, and Paranoid Schizophrenia, no Suicide Attempts or Self Injurious Behavior, nor hospitalizations.  He is currently being treated with Nardil, Mirapex, and Librium.  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  He states that it kept worsening especially his freshman year of college.  He states at that time he started seeing a therapist and was treated by his PCP.  He reports when he was trialed on Wellbutrin he had a significant reaction to it.  He describes it as a manic like state where he was jittery, had too much energy, slept very little, tried to clean out his garage but just made a bigger mess, and had disorganized thinking.  He reports that after that he was referred to a psychiatrist at [facility].  He reports that he has been tried on multiple medications and that the one that has at least stopped severe depressive symptoms has been Nardil.  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  He reports he is trying to eventually go to medical school and so needs to get his mental health better prior to this.  He reports that he has never had any thoughts of self-harm but that he has had passive SI with the thought of not wanting to be here and that if something were to happen to him he would be all right with that but reports he has never had any intent or plan ever.  His past psychiatric history significant for MDD and GAD.  He reports that in the past there was discussions of cyclothymia, bipolar disorder, and paranoid schizophrenia.  He reports no history of suicide attempts or  self-injurious behavior.  He reports no prior hospitalizations.  He reports that in the past he has trialed the following medications- Cymbalta, Wellbutrin, Seroquel, Effexor, Zoloft, Prozac, Abilify, Trazodone, Doxepin, Lithium, Emsam, Nardil, Parnate, and Marplan.  He reports that there are few others that he cannot remember the names of.  He reports no history of seizures.  Discussed [place] with him.  Discussed TMS process and study results on treatment resistant depression.  Discussed the first appointment and what is involved with finding the MT.  Discussed with him that afterwards treatment sessions would last approximately 20 minutes.  Discussed that treatments are Monday through Friday for 6 weeks and then there is a taper period.  Discussed that there is no definitive timeline for how long results would last but that typically we expect at least 1 year of improvement to consider additional treatments in the future.  Discussed that he should continue taking their medications as prescribed by their outpatient provider discussed that the tech would run the treatments afterwards with their parameters saved into the system, however, if any questions or issues arose I would be immediately available by phone or could be on site.  Discussed potential risks and side effects including but not limited to: Discomfort at site of magnet placement, headache, or seizure.  Confirmed that there was no implanted/retained metal in the head (aneurysm clips, plates, screws).  Confirmed that there  is no significant dental/bridge work.  Confirmed that there is no implanted pacemaker/defibrillator.  Discussed that jewelry should not be worn or removed from the ear for treatment.  Discussed that treatments are done at [facility].  Discussed that treatment room can have lights dimmed, music played, or have the TV on during treatment sessions for comfort.  All questions were invited and answered. He were agreeable to proceed with MT  appointment.  He reports no SI, HI, or AVH.  He reports his sleep is poor.  He reports appetite is doing okay.  Discussed with him that we would submit his information to the insurance company for approval and reach out to him to schedule his MD appointment.  He reports no other concerns at present.   Visit Diagnosis:    ICD-10-CM   1. Severe episode of recurrent major depressive disorder, without psychotic features ([place])  F33.2     2. GAD (generalized anxiety disorder)  F41.1       Past Psychiatric History: MDD, Recurrent, GAD, and discussions of Cyclothimia, Bipolar Disorder, and Paranoid Schizophrenia, no Suicide Attempts or Self Injurious Behavior, nor hospitalizations.  Past Medical History:  Past Medical History:  Diagnosis Date   Anxiety with depression 09/30/2016   Bipolar disorder (HCC)    Constipation    History of PSVT (paroxysmal supraventricular tachycardia)    Hyperhidrosis    IBS (irritable bowel syndrome)    Joint pain    Low back pain    Major depression, melancholic type    Schizophrenia ([place])    Thyroid disorder    Vitamin D deficiency     Past Surgical History:  Procedure Laterality Date   HAND RECONSTRUCTION Right 11/2017   WISDOM TOOTH EXTRACTION  11/2015    Family Psychiatric History: Mother- Depression Maternal Grandmother- Bipolar II Paternal Great Uncle- EtOH Abuse, Completed Suicide Maternal Great Uncle- PTSD ([place] Veteran), multiple Suicide Attempts Maternal Family- several members with depression Maternal and Paternal Family- Multiple members with EtOH Abuse   Family History:  Family History  Problem Relation Age of Onset   Anxiety disorder Mother    Depression Mother    Sudden death Neg Hx    Heart attack Neg Hx     Social History:  Social History   Socioeconomic History   Marital status: Single    Spouse name: Not on file   Number of children: Not on file   Years of education: Not on file   Highest education level: Not on  file  Occupational History   Not on file  Tobacco Use   Smoking status: Never   Smokeless tobacco: Never  Vaping Use   Vaping Use: Never used  Substance and Sexual Activity   Alcohol use: No   Drug use: No   Sexual activity: Not on file  Other Topics Concern   Not on file  Social History Narrative   Not on file   Social Determinants of Health   Financial Resource Strain: Not on file  Food Insecurity: Not on file  Transportation Needs: Not on file  Physical Activity: Not on file  Stress: Not on file  Social Connections: Not on file    Allergies:  Allergies  Allergen Reactions   Sulfa Antibiotics     Family history of severe reactions    Metabolic Disorder Labs: Lab Results  Component Value Date   HGBA1C 4.7 (L) 11/11/2021   No results found for: "PROLACTIN" Lab Results  Component Value Date   CHOL 127 11/03/2020  TRIG 80.0 11/03/2020   HDL 47.70 11/03/2020   CHOLHDL 3 11/03/2020   VLDL 16.0 11/03/2020   LDLCALC 63 11/03/2020   LDLCALC 53 02/03/2020   Lab Results  Component Value Date   TSH 0.346 (L) 02/03/2020   TSH 0.559 05/28/2019    Therapeutic Level Labs: Lab Results  Component Value Date   LITHIUM 0.8 11/01/2017   No results found for: "VALPROATE" No results found for: "CBMZ"  Current Medications: Current Outpatient Medications  Medication Sig Dispense Refill   chlordiazePOXIDE (LIBRIUM) 10 MG capsule Take 1 capsule (10 mg total) by mouth at bedtime as needed for Anxiety 90 capsule 0   doxycycline (MONODOX) 100 MG capsule TAKE 1 CAPSULE BY MOUTH DAILY WITH FOOD. 30 capsule 2   Levomefolate Glucosamine (METHYLFOLATE) 400 MCG CAPS Take by mouth.     levothyroxine (SYNTHROID) 25 MCG tablet Take 1 tablet by mouth in the morning on an empty stomach 6 days per week 30 tablet 2   linaclotide (LINZESS) 290 MCG CAPS capsule TAKE 1 CAPSULE (290 MCG TOTAL) BY MOUTH DAILY BEFORE BREAKFAST. 30 capsule 2   liothyronine (CYTOMEL) 5 MCG tablet Take 1  tablet by mouth twice daily 60 tablet 6   montelukast (SINGULAIR) 10 MG tablet Take 1 tablet (10 mg total) by mouth at bedtime. 30 tablet 3   oxybutynin (DITROPAN) 5 MG tablet Take 1 tablet by mouth twice a day 60 tablet 5   phenelzine (NARDIL) 15 MG tablet Take 4 tablets (60 mg total) by mouth 2 (two) times daily. 720 tablet 1   Potassium Citrate (UROCIT-K 15) 15 MEQ (1620 MG) TBCR Take 1 tablet by mouth 2 (two) times daily. 120 tablet 3   pramipexole (MIRAPEX) 1 MG tablet Take 1 tablet (1 mg total) by mouth at bedtime. 90 tablet 1   pyridoxine (B-6) 100 MG tablet Take 100 mg by mouth daily.     Semaglutide-Weight Management (WEGOVY) 2.4 MG/0.75ML SOAJ Inject 2.4 mg into the skin once a week. 3 mL 0   Vitamin D, Ergocalciferol, (DRISDOL) 1.25 MG (50000 UNIT) CAPS capsule Take 1 capsule (50,000 Units total) by mouth every 7 (seven) days. 4 capsule 0   No current facility-administered medications for this visit.     Musculoskeletal: Strength & Muscle Tone: within normal limits Gait & Station: normal Patient leans: N/A  Psychiatric Specialty Exam: Review of Systems  Respiratory:  Negative for cough and shortness of breath.   Cardiovascular:  Negative for chest pain.  Gastrointestinal:  Negative for abdominal pain, constipation, diarrhea, nausea and vomiting.  Neurological:  Negative for weakness and headaches.  Psychiatric/Behavioral:  Positive for dysphoric mood and sleep disturbance. Negative for agitation, hallucinations, self-injury and suicidal ideas. The patient is nervous/anxious.     There were no vitals taken for this visit.There is no height or weight on file to calculate BMI.  General Appearance: Casual and Fairly Groomed  Eye Contact:  Good  Speech:  Clear and Coherent and Normal Rate  Volume:  Normal  Mood:  Depressed  Affect:  Congruent and Depressed  Thought Process:  Coherent and Goal Directed  Orientation:  Full (Time, Place, and Person)  Thought Content: Logical    Suicidal Thoughts:  No  Homicidal Thoughts:  No  Memory:  Immediate;   Good Recent;   Good  Judgement:  Fair  Insight:  Good  Psychomotor Activity:  Normal  Concentration:  Concentration: Good and Attention Span: Good  Recall:  Good  Fund of Knowledge: Good  Language: Good  Akathisia:  Negative  Handed:  Right  AIMS (if indicated): not done  Assets:  Communication Skills Desire for Improvement Resilience Social Support  ADL's:  Intact  Cognition: WNL  Sleep:  Poor   Screenings: GAD-7    Flowsheet Row Office Visit from [facility]/17/2022 in [facility]  Total GAD-7 Score 10      PHQ2-9    [place] Office Visit from 01/17/2022 in [facility] Office Visit from 12/31/2021 in [facility] [place] Visit from 11/03/2020 in [place] [facility] [place] Visit from 05/28/2019 in [place] Office Visit from 09/30/2016 in [place] at [facility] Total Score '5 3 2 6 4  '$ PHQ-9 Total Score '21 7 4 14 14      '$ Flowsheet Row ED from 04/21/2021 in [place] No Risk        Assessment and Plan:  Javeon Macmurray. Deanda is a 26 yr old male who presents for [place] Consult.  PPHx is significant for MDD, Recurrent, GAD, and discussions of Cyclothimia, Bipolar Disorder, and Paranoid Schizophrenia, no Suicide Attempts or Self Injurious Behavior, nor hospitalizations.   Zailen is a good candidate for TMS due to his history, PHQ-9 score of 21, and his trials of medications: Cymbalta, Wellbutrin, Seroquel, Effexor, Zoloft, Prozac, Abilify, Trazodone, Doxepin, Lithium, Emsam, Nardil, Parnate, and Marplan.  We will schedule his appointment for his MT appointment.    Collaboration of Care:   Patient/Guardian was advised Release of Information must be  obtained prior to any record release in order to collaborate their care with an outside provider. Patient/Guardian was advised if they have not already done so to contact the registration department to sign all necessary forms in order for [name] to release information regarding their care.   Consent: Patient/Guardian gives verbal consent for treatment and assignment of benefits for services provided during this visit. Patient/Guardian expressed understanding and agreed to proceed.    Briant Cedar, MD 01/17/2022, 10:14 AM

## 2022-01-17 NOTE — Telephone Encounter (Signed)
D:  Pt was seen this morning for a [place] Consult with Dr. Kai Levins.  A:  Placed call to [facility] for pre-authorization.  Spoke to rep ([place]) who stated that patient doesn't have any coverage for [place] ([id number] nor [id number]).  Interim Coordinator place call to the [employer] reimbursement person Tye [place]) to ask her about [facility].  Will also call [employer] again to verify patient's benefits before informing the patient.  Informed [place] Team.

## 2022-01-18 ENCOUNTER — Encounter: Payer: Self-pay | Admitting: Family Medicine

## 2022-01-18 ENCOUNTER — Ambulatory Visit: Payer: 59 | Admitting: Family Medicine

## 2022-01-18 ENCOUNTER — Other Ambulatory Visit (HOSPITAL_BASED_OUTPATIENT_CLINIC_OR_DEPARTMENT_OTHER): Payer: Self-pay

## 2022-01-18 ENCOUNTER — Telehealth (HOSPITAL_COMMUNITY): Payer: Self-pay | Admitting: Psychiatry

## 2022-01-18 VITALS — BP 120/68 | HR 68 | Temp 97.8°F | Ht 68.0 in | Wt 213.2 lb

## 2022-01-18 DIAGNOSIS — Z111 Encounter for screening for respiratory tuberculosis: Secondary | ICD-10-CM

## 2022-01-18 DIAGNOSIS — R5383 Other fatigue: Secondary | ICD-10-CM

## 2022-01-18 DIAGNOSIS — H6591 Unspecified nonsuppurative otitis media, right ear: Secondary | ICD-10-CM | POA: Diagnosis not present

## 2022-01-18 MED ORDER — LEVOTHYROXINE SODIUM 25 MCG PO TABS: ORAL_TABLET | ORAL | 11 refills | Status: DC

## 2022-01-18 MED ORDER — LEVOCETIRIZINE DIHYDROCHLORIDE 5 MG PO TABS
5.0000 mg | ORAL_TABLET | Freq: Every evening | ORAL | 2 refills | Status: DC
  Filled 2022-01-18: qty 30, 30d supply, fill #0
  Filled 2022-02-17: qty 30, 30d supply, fill #1
  Filled 2022-03-23: qty 30, 30d supply, fill #2

## 2022-01-18 NOTE — Patient Instructions (Signed)
If you do not hear anything about your referral in the next 1-2 weeks, call our office and ask for an update.  Stay on the [place] eand Singulair. We are replacing Claritin with Xyzal/levocetirizine.   Let us know if you need anything.

## 2022-01-18 NOTE — Telephone Encounter (Signed)
D:  Placed a second call to pt's insurance company [facility]) to get pre-authorization for [place].  Yesterday spoke with [place] E who stated that patient had no coverage for the [place] CPT codes.  Today called again and spoke to [facility]; according to her no pre-cert is needed for the [place] codes.  She also quoted his benefits:  No copay; deductible is covered at 80% for [place] called to inform pt.  Pt voices concerns about the cost of [place].  States he will not move forward until he gets a price from someone.  A:  Consulted with the [place] team; along with Beather Arbour, RN.  Shawn reiterated what the interim coordinator had encouraged pt to do; to call his insurance company to verify his own benefits and ask them for the rate.  Pt still is stating that he would like the price per treatments before he starts.  Shawn encouraged the coordinator to ask [place] Civil engineer, contracting) and or Rosalie Doctor (mgr) for the information.  Informed pt that the coordinator has him tentatively scheduled to start tx's on 01-20-22 @ 7 a.m.  Provided pt with the address; encouraged him to arrive 20 minutes early; if he decides tomorrow to move forward.  Coordinator will contact pt tomorrow.  R:  Pt receptive.

## 2022-01-18 NOTE — Progress Notes (Signed)
Chief Complaint  Patient presents with   Nasal Congestion    Right ear pain Complete 2 rounds of prednisone and 10 days of antibiotic     Pt is here for right ear fullness. Duration: 1  mo Progression:  waxing and waning Associated symptoms: congestion and non productive cough Denies: coryza and sneezing, bleeding, or discharge from ear Treatment to date: prednisone burst x 2 which helps but comes back; 10 d course of Omnicef  Past Medical History:  Diagnosis Date   Anxiety with depression 09/30/2016   Bipolar disorder (HCC)    Constipation    History of PSVT (paroxysmal supraventricular tachycardia)    Hyperhidrosis    IBS (irritable bowel syndrome)    Joint pain    Low back pain    Major depression, melancholic type    Schizophrenia ([place])    Thyroid disorder    Vitamin D deficiency     BP 120/68   Pulse 68   Temp 97.8 F (36.6 C) (Oral)   Ht '5\' 8"'$  (1.727 m)   Wt 213 lb 4 oz (96.7 kg)   SpO2 99%   BMI 32.42 kg/m  General: Awake, alert, appearing stated age HEENT:  L ear- Canal patent without drainage or erythema, TM is neg R ear- canal patent without drainage or erythema, TM is w what appears to be serous fluid, no bulging or erythema Nose- nares patent and without discharge Mouth- Lips, gums and dentition unremarkable, pharynx is without erythema or exudate Neck: No adenopathy Lungs: Normal effort, no accessory muscle use Psych: Age appropriate judgment and insight, normal mood and affect  Fluid level behind tympanic membrane of right ear - Plan: levocetirizine (XYZAL) 5 MG tablet, Ambulatory referral to ENT  Fatigue, unspecified type - Plan: Monospot  Screening-pulmonary TB - Plan: QuantiFERON-TB Gold Plus  Refer ent. Add Xyzal to INCS and Singulair. Ck other labs as above.  Pt voiced understanding and agreement to the plan.  [place], DO 01/18/22 4:36 PM

## 2022-01-19 ENCOUNTER — Other Ambulatory Visit (HOSPITAL_BASED_OUTPATIENT_CLINIC_OR_DEPARTMENT_OTHER): Payer: Self-pay

## 2022-01-19 ENCOUNTER — Telehealth (HOSPITAL_COMMUNITY): Payer: Self-pay | Admitting: Psychiatry

## 2022-01-19 LAB — MONONUCLEOSIS SCREEN: Mono Screen: NEGATIVE

## 2022-01-19 NOTE — Telephone Encounter (Signed)
D:  Placed call to provide pt with the website for the [facility] billing estimator (provided by [facility]), but he didn't answer.  A:  Left vm for pt to call the interim coordinator back re: moving forward with [place] tomorrow or not.  If coordinator doesn't hear back from pt before she leaves, she will cancel his appt for tomorrow.  Inform the team.

## 2022-01-20 ENCOUNTER — Other Ambulatory Visit (HOSPITAL_COMMUNITY): Payer: 59 | Attending: Psychiatry | Admitting: *Deleted

## 2022-01-20 DIAGNOSIS — F332 Major depressive disorder, recurrent severe without psychotic features: Secondary | ICD-10-CM

## 2022-01-20 NOTE — Progress Notes (Unsigned)
Pt reported to [facility] for his cortical mapping and motor threshold determination for Repetitive Transcranial Magnetic Stimulation treatment for Major Depressive Disorder. Pt completed a PHQ-9 with a score of 20.  Prior to procedure, pt signed an informed consent agreement for [place] treatment. Pt's treatment area was found by applying single pulses to her left motor cortex, hunting along the anterior/posterior plane and along the superior oblique angle until the best motor response was elicited from the pt's right thumb. Using the [employer]'s proprietary MT Assist algorithm, which produced a calculated motor threshold of 1.20 SMT. Per these findings, pt's treatment parameter. With these parameters, the pt will receive 36 sessions of TMS according to the following protocol: 3000 pulses per session. After determining pt's tx parameters, coil was moved to the treatment location, and the first burst of pulses was applied at a reduced power of 90% MT. Pt was able to tolerate titration to 100% MT by the end of treatment. Pt reported no complaints, and stated that the stimulation was tolerable. Upon completion of mapping, pt completed a few treatment intervals for observation of side effects. Pt tolerated tx well. Pt departed from clinic without issue. Dr. Kai Levins completed cortical mapping.

## 2022-01-21 ENCOUNTER — Other Ambulatory Visit (HOSPITAL_COMMUNITY): Payer: 59 | Attending: Psychiatry | Admitting: *Deleted

## 2022-01-21 DIAGNOSIS — F332 Major depressive disorder, recurrent severe without psychotic features: Secondary | ICD-10-CM

## 2022-01-21 LAB — QUANTIFERON-TB GOLD PLUS
Mitogen-NIL: >10 IU/mL
NIL: 0.03 IU/mL
QuantiFERON-TB Gold Plus: NEGATIVE
TB1-NIL: 0.01 IU/mL
TB2-NIL: 0 IU/mL

## 2022-01-21 NOTE — Progress Notes (Signed)
Patient reported to [facility] for his 2nd session of Repetitive Transcranial Magnetic Stimulation treatment for severe episode of recurrent major depressive disorder, without psychotic features. Patient presented with appropriate affect, level mood and denied any suicidal or homicidal ideations.Patient reported no change in alcohol/substance use, caffeine consumption, sleep pattern or metal implant status since previous tx. Pt reported no sedation or headache after treatment yesterday.  Power was titrated to 120% for the last 5 minutes of treatment. Patient reported no complaints.

## 2022-01-25 ENCOUNTER — Other Ambulatory Visit (HOSPITAL_COMMUNITY): Payer: 59 | Attending: Psychiatry | Admitting: *Deleted

## 2022-01-25 DIAGNOSIS — F332 Major depressive disorder, recurrent severe without psychotic features: Secondary | ICD-10-CM | POA: Diagnosis not present

## 2022-01-25 NOTE — Progress Notes (Signed)
Patient reported to [facility] for his 3rd session of Repetitive Transcranial Magnetic Stimulation treatment for severe episode of recurrent major depressive disorder, without psychotic features. Patient presented with appropriate affect, level mood and denied any suicidal or homicidal ideations.Patient reported no change in alcohol/substance use, caffeine consumption, sleep pattern or metal implant status since previous tx. Pt reported no sedation or headache after treatment yesterday.  Pt had a good weekend; hiking about 6 miles in [place]. Power was titrated to 120%.  Patient reported no complaints.

## 2022-01-25 NOTE — Progress Notes (Signed)
Chief Complaint:   OBESITY Albert [name] is here to discuss his progress with his obesity treatment plan along with follow-up of his obesity related diagnoses. [name] is on keeping a food journal and adhering to recommended goals of 1900-2000 calories and 155+ grams protein and states he is following his eating plan approximately 40% of the time. Brantley states he is running and weight training 60 minutes 5 times per week.  Today's visit was #: 20 Starting weight: 236 lbs Starting date: 05/28/2019 Today's weight: 207 lbs Today's date: 01/17/2022 Total lbs lost to date: 29 Total lbs lost since last in-office visit: +5  Interim History: Nyxon lost his job 2 weeks ago. He is on prednisone and triamcinolone nasally for 2 weeks, as well, which increased his appetite. Pt has been struggling with his emotions recently.  Subjective:   1. Insulin resistance When on plan, Albert [name] denies hunger or cravings. When he struggles emotionally, he doesn't eat. Pt was recently on prednisone, which has caused increased hunger.   2. Vitamin D deficiency He is currently taking prescription vitamin D 50,000 IU each week. He denies nausea, vomiting or muscle weakness.  3. Mood disorder ([place]) Albert [name] lost his job due to having some forgetfulness, wasn't fast enough, and was "let go." He has a psychiatrist and will be doing ECT or [place]. Jarmaine denies suicidal or homicidal ideations. Pt does have some feelings of hopelessness but no severe symptoms currently.  Assessment/Plan:  No orders of the defined types were placed in this encounter.   Medications Discontinued During This Encounter  Medication Reason   Vitamin D, Ergocalciferol, (DRISDOL) 1.25 MG (50000 UNIT) CAPS capsule Reorder     Meds ordered this encounter  Medications   Vitamin D, Ergocalciferol, (DRISDOL) 1.25 MG (50000 UNIT) CAPS capsule    Sig: Take 1 capsule (50,000 Units total) by mouth every 7 (seven) days.    Dispense:  4 capsule    Refill:   0     1. Insulin resistance Albert [name] will continue to work on weight loss, exercise, and decreasing simple carbohydrates to help decrease the risk of diabetes. Albert [name] agreed to follow-up with [name] as directed to closely monitor his progress. Pt is on Wegovy at 2.4 mg weekly. He denies need for refill. Continue prudent nutritional plan and weight loss. Pt will follow up with PCP regarding further treatment for chronic sinus issues.  2. Vitamin D deficiency Low Vitamin D level contributes to fatigue and are associated with obesity, breast, and colon cancer. He agrees to continue to take prescription Vitamin D '@50'$ ,000 IU every week and will follow-up for routine testing of Vitamin D, at least 2-3 times per year to avoid over-replacement.  Refill- Vitamin D, Ergocalciferol, (DRISDOL) 1.25 MG (50000 UNIT) CAPS capsule; Take 1 capsule (50,000 Units total) by mouth every 7 (seven) days.  Dispense: 4 capsule; Refill: 0  3. Mood disorder ([place]) Continue meds per psychiatrist. Continue every 1-2 weeks with counselor and maybe more often is needed. Encouraged pt to increase exercise to help with mood and further self-care activities.  4. Obesity with current BMI of 31.6 Cap is currently in the action stage of change. As such, his goal is to continue with weight loss efforts. He has agreed to keeping a food journal and adhering to recommended goals of 1900-2000 calories and 155+ grams protein.   Try to journal and bring in log to next OV.  Exercise goals:  Increase exercise to doing cardio 30 minutes 5 days a  week and continue weight training as is.  Behavioral modification strategies: keeping a strict food journal.  Albert [name] has agreed to follow-up with our clinic in 3-4 weeks. He was informed of the importance of frequent follow-up visits to maximize his success with intensive lifestyle modifications for his multiple health conditions.   Objective:   Blood pressure 106/69, pulse 64, temperature  98.1 F (36.7 C), height '5\' 9"'$  (1.753 m), weight 207 lb (93.9 kg), SpO2 99 %. Body mass index is 30.57 kg/m.  General: Cooperative, alert, well developed, in no acute distress. HEENT: Conjunctivae and lids unremarkable. Cardiovascular: Regular rhythm.  Lungs: Normal work of breathing. Neurologic: No focal deficits.   Lab Results  Component Value Date   CREATININE 1.07 11/11/2021   BUN 15 11/11/2021   NA 140 11/11/2021   K 4.1 11/11/2021   CL 102 11/11/2021   CO2 22 11/11/2021   Lab Results  Component Value Date   ALT 32 11/11/2021   AST 31 11/11/2021   ALKPHOS 68 11/11/2021   BILITOT 0.5 11/11/2021   Lab Results  Component Value Date   HGBA1C 4.7 (L) 11/11/2021   HGBA1C 5.0 02/03/2020   HGBA1C 4.8 05/28/2019   Lab Results  Component Value Date   INSULIN 16.5 11/11/2021   INSULIN 6.2 02/03/2020   INSULIN 19.0 05/28/2019   Lab Results  Component Value Date   TSH 0.346 (L) 02/03/2020   Lab Results  Component Value Date   CHOL 127 11/03/2020   HDL 47.70 11/03/2020   LDLCALC 63 11/03/2020   TRIG 80.0 11/03/2020   CHOLHDL 3 11/03/2020   Lab Results  Component Value Date   VD25OH 54.6 11/11/2021   VD25OH 97.9 03/22/2021   VD25OH 65.6 02/03/2020   Lab Results  Component Value Date   WBC 10.3 04/21/2021   HGB 14.9 04/21/2021   HCT 44.1 04/21/2021   MCV 88.9 04/21/2021   PLT 209 04/21/2021    Attestation Statements:   Reviewed by clinician on day of visit: allergies, medications, problem list, medical history, surgical history, family history, social history, and previous encounter notes.  Time spent on visit including pre-visit chart review and post-visit care and charting was 30 minutes.   I, Kathlene November, BS, CMA, am acting as transcriptionist for [employer], DO.   I have reviewed the above documentation for accuracy and completeness, and I agree with the above. Marjory Sneddon, D.O.  The [place] was signed into law in  2016 which includes the topic of electronic health records.  This provides immediate access to information in MyChart.  This includes consultation notes, operative notes, office notes, lab results and pathology reports.  If you have any questions about what you read please let us know at your next visit so we can discuss your concerns and take corrective action if need be.  We are right here with you.

## 2022-01-26 ENCOUNTER — Other Ambulatory Visit (HOSPITAL_COMMUNITY): Payer: 59 | Attending: Psychiatry | Admitting: *Deleted

## 2022-01-26 ENCOUNTER — Telehealth (HOSPITAL_COMMUNITY): Payer: Self-pay | Admitting: Psychiatry

## 2022-01-26 DIAGNOSIS — F332 Major depressive disorder, recurrent severe without psychotic features: Secondary | ICD-10-CM

## 2022-01-26 NOTE — Progress Notes (Signed)
Patient reported to [facility] for his 4th session of Repetitive Transcranial Magnetic Stimulation treatment for severe episode of recurrent major depressive disorder, without psychotic features. Patient presented with appropriate affect, level mood and denied any suicidal or homicidal ideations.Patient reported no change in alcohol/substance use, caffeine consumption, sleep pattern or metal implant status since previous tx. Pt reported no sedation or headache after treatment yesterday. Power was titrated to 120%.  Patient reported no complaints.

## 2022-01-27 ENCOUNTER — Other Ambulatory Visit (HOSPITAL_COMMUNITY): Payer: 59 | Attending: Psychiatry | Admitting: *Deleted

## 2022-01-27 ENCOUNTER — Other Ambulatory Visit (HOSPITAL_BASED_OUTPATIENT_CLINIC_OR_DEPARTMENT_OTHER): Payer: Self-pay

## 2022-01-27 DIAGNOSIS — F332 Major depressive disorder, recurrent severe without psychotic features: Secondary | ICD-10-CM

## 2022-01-27 NOTE — Progress Notes (Signed)
Patient reported to [facility] for his 5th session of Repetitive Transcranial Magnetic Stimulation treatment for severe episode of recurrent major depressive disorder, without psychotic features. Patient presented with appropriate affect, level mood and denied any suicidal or homicidal ideations.Patient reported no change in alcohol/substance use, caffeine consumption, sleep pattern or metal implant status since previous tx. Pt reported no sedation or headache after treatment yesterday. 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 Power was titrated to 120%.  Patient reported no complaints

## 2022-01-28 ENCOUNTER — Other Ambulatory Visit (HOSPITAL_COMMUNITY): Payer: 59 | Attending: Psychiatry | Admitting: *Deleted

## 2022-01-28 DIAGNOSIS — F332 Major depressive disorder, recurrent severe without psychotic features: Secondary | ICD-10-CM

## 2022-01-28 NOTE — Progress Notes (Signed)
Patient reported to [facility] for his 6th session of Repetitive Transcranial Magnetic Stimulation treatment for severe episode of recurrent major depressive disorder, without psychotic features. Patient presented with appropriate affect, level mood and denied any suicidal or homicidal ideations.Patient reported no change in alcohol/substance use, caffeine consumption, sleep pattern or metal implant status since previous tx. Pt reported no sedation or headache after treatment yesterday. Pt reports that he plans to play D&D with friends this weekend. Power was titrated to 120%.  Patient reported no complaints. PHQ-9 today is 16.

## 2022-01-29 ENCOUNTER — Encounter: Payer: Self-pay | Admitting: Family Medicine

## 2022-01-31 ENCOUNTER — Institutional Professional Consult (permissible substitution) (HOSPITAL_COMMUNITY): Payer: Self-pay | Admitting: Student in an Organized Health Care Education/Training Program

## 2022-01-31 ENCOUNTER — Other Ambulatory Visit (HOSPITAL_BASED_OUTPATIENT_CLINIC_OR_DEPARTMENT_OTHER): Payer: Self-pay

## 2022-01-31 ENCOUNTER — Other Ambulatory Visit (HOSPITAL_COMMUNITY): Payer: 59 | Attending: Psychiatry | Admitting: *Deleted

## 2022-01-31 DIAGNOSIS — F332 Major depressive disorder, recurrent severe without psychotic features: Secondary | ICD-10-CM | POA: Diagnosis not present

## 2022-01-31 DIAGNOSIS — F32A Depression, unspecified: Secondary | ICD-10-CM | POA: Diagnosis not present

## 2022-01-31 MED ORDER — PRAMIPEXOLE DIHYDROCHLORIDE 1 MG PO TABS
1.0000 mg | ORAL_TABLET | Freq: Every day | ORAL | 1 refills | Status: DC
  Filled 2022-01-31 – 2022-03-23 (×2): qty 90, 90d supply, fill #0

## 2022-01-31 MED ORDER — PHENELZINE SULFATE 15 MG PO TABS
30.0000 mg | ORAL_TABLET | Freq: Three times a day (TID) | ORAL | 1 refills | Status: DC
  Filled 2022-01-31 – 2022-02-08 (×2): qty 270, 45d supply, fill #0
  Filled 2022-03-23: qty 270, 45d supply, fill #1

## 2022-01-31 NOTE — Progress Notes (Signed)
Patient reported to [facility] for his 7th session of Repetitive Transcranial Magnetic Stimulation treatment for severe episode of recurrent major depressive disorder, without psychotic features. Patient presented with appropriate affect, level mood and denied any suicidal or homicidal ideations.Patient reported no change in alcohol/substance use, caffeine consumption, sleep pattern or metal implant status since previous tx. Pt reported no sedation or headache after treatments. Pt reports that he had fun playing D&D with his friends this weekend. Power was titrated to 120%. Pt had no complaints .

## 2022-02-01 ENCOUNTER — Other Ambulatory Visit (HOSPITAL_COMMUNITY): Payer: 59 | Attending: Psychiatry | Admitting: *Deleted

## 2022-02-01 DIAGNOSIS — F332 Major depressive disorder, recurrent severe without psychotic features: Secondary | ICD-10-CM

## 2022-02-01 NOTE — Progress Notes (Signed)
Patient reported to [facility] for his 8th session of Repetitive Transcranial Magnetic Stimulation treatment for severe episode of recurrent major depressive disorder, without psychotic features. Patient presented with appropriate affect, level mood and denied any suicidal or homicidal ideations.Patient reported no change in alcohol/substance use, caffeine consumption, sleep pattern or metal implant status since previous tx. Pt reported no sedation or headache after treatments. Power was titrated to 120%. Pt had no complaints .

## 2022-02-02 ENCOUNTER — Other Ambulatory Visit (HOSPITAL_COMMUNITY): Payer: 59 | Attending: Psychiatry | Admitting: *Deleted

## 2022-02-02 DIAGNOSIS — F332 Major depressive disorder, recurrent severe without psychotic features: Secondary | ICD-10-CM

## 2022-02-02 NOTE — Progress Notes (Addendum)
Patient reported to [facility] for his 9th session of Repetitive Transcranial Magnetic Stimulation treatment for severe episode of recurrent major depressive disorder, without psychotic features. Patient presented with appropriate affect, level mood and denied any suicidal or homicidal ideations.Patient reported no change in alcohol/substance use, caffeine consumption, sleep pattern or metal implant status since previous tx. Pt stated that at night he has been experiencing involuntary muscle twitches in his hands, feet, and shoulders. He wanted to know if TMS could be causing it. Will message Dr. Kai Levins. Pt reported no sedation or headache after treatments. Power was titrated to 120%. Pt had no complaints .

## 2022-02-03 ENCOUNTER — Other Ambulatory Visit (HOSPITAL_COMMUNITY): Payer: 59 | Attending: Psychiatry | Admitting: *Deleted

## 2022-02-03 DIAGNOSIS — F332 Major depressive disorder, recurrent severe without psychotic features: Secondary | ICD-10-CM | POA: Diagnosis not present

## 2022-02-03 NOTE — Progress Notes (Signed)
Patient reported to [facility] for his 10th session of Repetitive Transcranial Magnetic Stimulation treatment for severe episode of recurrent major depressive disorder, without psychotic features. Patient presented with appropriate affect, level mood and denied any suicidal or homicidal ideations.Patient reported no change in alcohol/substance use, caffeine consumption, sleep pattern or metal implant status since previous tx. Pt reports that yesterday was a good day, though he said he felt more tired than usual. Pt reported no sedation or headache after treatments. Power was titrated to 120%. Pt had no complaints.

## 2022-02-04 ENCOUNTER — Encounter: Payer: Self-pay | Admitting: Family Medicine

## 2022-02-04 ENCOUNTER — Telehealth (HOSPITAL_COMMUNITY): Payer: Self-pay | Admitting: Psychiatry

## 2022-02-04 ENCOUNTER — Other Ambulatory Visit (HOSPITAL_COMMUNITY): Payer: 59 | Attending: Psychiatry | Admitting: *Deleted

## 2022-02-04 ENCOUNTER — Ambulatory Visit (INDEPENDENT_AMBULATORY_CARE_PROVIDER_SITE_OTHER): Payer: 59 | Admitting: Family Medicine

## 2022-02-04 VITALS — BP 110/72 | HR 83 | Temp 97.5°F | Ht 69.0 in | Wt 212.2 lb

## 2022-02-04 DIAGNOSIS — F332 Major depressive disorder, recurrent severe without psychotic features: Secondary | ICD-10-CM | POA: Diagnosis not present

## 2022-02-04 DIAGNOSIS — M2550 Pain in unspecified joint: Secondary | ICD-10-CM | POA: Diagnosis not present

## 2022-02-04 DIAGNOSIS — Z23 Encounter for immunization: Secondary | ICD-10-CM | POA: Diagnosis not present

## 2022-02-04 NOTE — Progress Notes (Signed)
Chief Complaint  Patient presents with   Follow-up    Lab work Flu shot     Subjective: Patient is a 26 y.o. male here for jt pain.  Over past 3 mo, has had b/l shoulder/hip pain. No correlation w physical activity. No inj or change in activity. No rashes, fevers, tick bites, famhx of AI disease that he is aware of. +photosensitivity. No oral ulcers, chest pain, SOB. Sometimes will get a butterfly rash.   Past Medical History:  Diagnosis Date   Anxiety with depression 09/30/2016   Bipolar disorder (HCC)    Constipation    History of PSVT (paroxysmal supraventricular tachycardia)    Hyperhidrosis    IBS (irritable bowel syndrome)    Joint pain    Low back pain    Major depression, melancholic type    Schizophrenia (HCC)    Thyroid disorder    Vitamin D deficiency     Objective: BP 110/72   Pulse 83   Temp (!) 97.5 F (36.4 C) (Oral)   Ht '5\' 9"'$  (1.753 m)   Wt 212 lb 4 oz (96.3 kg)   SpO2 98%   BMI 31.34 kg/m  General: Awake, appears stated age Heart: RRR, no LE edema Lungs: CTAB, no rales, wheezes or rhonchi. No accessory muscle use MSK: TTP over the coracoid processes bilaterally, no other bony or muscular TTP over the shoulder region or hips Neuro: Gait is normal Skin: No external lesions noted on exposed skin Psych: Age appropriate judgment and insight, normal affect and mood  Assessment and Plan: Arthralgia, unspecified joint - Plan: Antinuclear Antib (ANA), ANA,IFA RA Diag Pnl w/rflx Tit/Patn, Sedimentation rate, C-reactive protein, Aldolase, Anti-DNA antibody, double-stranded, Anti-Smith antibody, Sjogrens syndrome-A extractable nuclear antibody, Sjogrens syndrome-B extractable nuclear antibody, Rheumatoid Factor, Uric acid  Need for influenza vaccination - Plan: Flu Vaccine QUAD 6+ mos PF IM (Fluarix Quad PF)  We will rule out standard rheumatologic conditions as above.  He is realistic about this testing.  We will consider a long COVID syndrome or simple  musculoskeletal explanation if these are normal.  Could consider referral to physical therapy.  Tylenol, ice, heat, gentle stretches, list of foods that may reduce inflammation given.  Flu shot today. The patient voiced understanding and agreement to the plan.  [place], DO 02/04/22  3:08 PM

## 2022-02-04 NOTE — Patient Instructions (Addendum)
Give [name] 4-5 business days to get the results of your labs back.   Foods that may reduce pain: 1) Ginger 2) Blueberries 3) Salmon 4) Pumpkin seeds 5) dark chocolate 6) turmeric 7) tart cherries 8) virgin olive oil 9) chilli peppers 10) mint 11) krill oil  Let us know if you need anything.

## 2022-02-04 NOTE — Progress Notes (Signed)
Patient reported to [facility] for his 11th session of Repetitive Transcranial Magnetic Stimulation treatment for severe episode of recurrent major depressive disorder, without psychotic features. Patient presented with appropriate affect, level mood and denied any suicidal or homicidal ideations.Patient reported no change in alcohol/substance use, caffeine consumption, sleep pattern or metal implant status since previous tx. Pt. Talkative throughout treatment. Pt reported no sedation or headache after treatments. Power was titrated to 120%. Pt had no complaints. PHQ-9 today a 17.

## 2022-02-07 ENCOUNTER — Other Ambulatory Visit (HOSPITAL_BASED_OUTPATIENT_CLINIC_OR_DEPARTMENT_OTHER): Payer: Self-pay

## 2022-02-07 ENCOUNTER — Other Ambulatory Visit (HOSPITAL_COMMUNITY): Payer: 59 | Attending: Psychiatry | Admitting: *Deleted

## 2022-02-07 ENCOUNTER — Ambulatory Visit (INDEPENDENT_AMBULATORY_CARE_PROVIDER_SITE_OTHER): Payer: 59 | Admitting: Family Medicine

## 2022-02-07 DIAGNOSIS — F332 Major depressive disorder, recurrent severe without psychotic features: Secondary | ICD-10-CM | POA: Diagnosis not present

## 2022-02-07 DIAGNOSIS — L738 Other specified follicular disorders: Secondary | ICD-10-CM | POA: Diagnosis not present

## 2022-02-07 DIAGNOSIS — L988 Other specified disorders of the skin and subcutaneous tissue: Secondary | ICD-10-CM | POA: Diagnosis not present

## 2022-02-07 MED ORDER — AMOXICILLIN-POT CLAVULANATE 875-125 MG PO TABS
ORAL_TABLET | ORAL | 1 refills | Status: DC
  Filled 2022-02-07: qty 30, 15d supply, fill #0
  Filled 2022-02-17 – 2022-02-23 (×2): qty 30, 15d supply, fill #1

## 2022-02-07 MED ORDER — CLINDAMYCIN PHOSPHATE 1 % EX GEL
CUTANEOUS | 2 refills | Status: DC
  Filled 2022-02-07: qty 30, 30d supply, fill #0
  Filled 2022-02-23: qty 30, 30d supply, fill #1

## 2022-02-07 NOTE — Progress Notes (Signed)
Patient reported to [facility] for his 12th session of Repetitive Transcranial Magnetic Stimulation treatment for severe episode of recurrent major depressive disorder, without psychotic features. Patient presented with appropriate affect, level mood and denied any suicidal or homicidal ideations.Patient reported no change in alcohol/substance use, caffeine consumption, sleep pattern or metal implant status since previous tx. Pt. Michela Pitcher his weekend was good; worked on Friday night and made a lot of money. Pt reported no sedation or headache after treatments. Power was titrated to 120%. Pt had no complaints.

## 2022-02-08 ENCOUNTER — Other Ambulatory Visit (HOSPITAL_COMMUNITY): Payer: 59 | Attending: Psychiatry | Admitting: *Deleted

## 2022-02-08 ENCOUNTER — Other Ambulatory Visit (HOSPITAL_BASED_OUTPATIENT_CLINIC_OR_DEPARTMENT_OTHER): Payer: Self-pay

## 2022-02-08 DIAGNOSIS — F332 Major depressive disorder, recurrent severe without psychotic features: Secondary | ICD-10-CM | POA: Diagnosis not present

## 2022-02-08 NOTE — Progress Notes (Signed)
Patient reported to [facility] for his 13th session of Repetitive Transcranial Magnetic Stimulation treatment for severe episode of recurrent major depressive disorder, without psychotic features. Patient presented with appropriate affect, level mood and denied any suicidal or homicidal ideations.Patient reported no change in alcohol/substance use, caffeine consumption, sleep pattern or metal implant status since previous tx. Pt. said his sinus' are giving him trouble; seeing a ENT soon. Pt reported no sedation or headache after treatments. Power was titrated to 120%. Pt had no complaints.

## 2022-02-09 ENCOUNTER — Other Ambulatory Visit (HOSPITAL_COMMUNITY): Payer: 59 | Attending: Psychiatry | Admitting: *Deleted

## 2022-02-09 DIAGNOSIS — F332 Major depressive disorder, recurrent severe without psychotic features: Secondary | ICD-10-CM | POA: Diagnosis not present

## 2022-02-09 NOTE — Progress Notes (Signed)
Patient reported to [facility] for his 14th session of Repetitive Transcranial Magnetic Stimulation treatment for severe episode of recurrent major depressive disorder, without psychotic features. Patient presented with appropriate affect, level mood and denied any suicidal or homicidal ideations.Patient reported no change in alcohol/substance use, caffeine consumption, sleep pattern or metal implant status since previous tx. Pt. said his sinus' are giving him trouble; seeing a ENT soon. Pt reported he started on Augmentin yesterday for his sinus infection. Pt reported no sedation or headache after treatments. Power was titrated to 120%. Pt had no complaints.

## 2022-02-10 ENCOUNTER — Other Ambulatory Visit (HOSPITAL_COMMUNITY): Payer: 59 | Attending: Psychiatry | Admitting: *Deleted

## 2022-02-10 ENCOUNTER — Other Ambulatory Visit (HOSPITAL_BASED_OUTPATIENT_CLINIC_OR_DEPARTMENT_OTHER): Payer: Self-pay

## 2022-02-10 DIAGNOSIS — L700 Acne vulgaris: Secondary | ICD-10-CM | POA: Diagnosis not present

## 2022-02-10 DIAGNOSIS — J0140 Acute pansinusitis, unspecified: Secondary | ICD-10-CM | POA: Diagnosis not present

## 2022-02-10 DIAGNOSIS — Z79899 Other long term (current) drug therapy: Secondary | ICD-10-CM | POA: Diagnosis not present

## 2022-02-10 DIAGNOSIS — F332 Major depressive disorder, recurrent severe without psychotic features: Secondary | ICD-10-CM

## 2022-02-10 DIAGNOSIS — L738 Other specified follicular disorders: Secondary | ICD-10-CM | POA: Diagnosis not present

## 2022-02-10 MED ORDER — PREDNISONE 10 MG PO TABS
ORAL_TABLET | ORAL | 0 refills | Status: DC
  Filled 2022-02-10: qty 10, 10d supply, fill #0

## 2022-02-10 MED ORDER — LEVOFLOXACIN 500 MG PO TABS
ORAL_TABLET | ORAL | 0 refills | Status: DC
  Filled 2022-02-10: qty 20, 20d supply, fill #0

## 2022-02-10 NOTE — Progress Notes (Signed)
Patient reported to [facility] for his 15th session of Repetitive Transcranial Magnetic Stimulation treatment for severe episode of recurrent major depressive disorder, without psychotic features. Patient presented with appropriate affect, level mood and denied any suicidal or homicidal ideations.Patient reported no change in alcohol/substance use, caffeine consumption, sleep pattern or metal implant status since previous tx. Pt. said his sinus' are giving him trouble; seeing a ENT today.  Pt reported no sedation or headache after treatments. Power was titrated to 120%. Pt had no complaints.

## 2022-02-11 ENCOUNTER — Other Ambulatory Visit (HOSPITAL_COMMUNITY): Payer: 59 | Attending: Psychiatry | Admitting: *Deleted

## 2022-02-11 DIAGNOSIS — F332 Major depressive disorder, recurrent severe without psychotic features: Secondary | ICD-10-CM

## 2022-02-11 NOTE — Progress Notes (Signed)
Patient reported to [facility] for his 16th session of Repetitive Transcranial Magnetic Stimulation treatment for severe episode of recurrent major depressive disorder, without psychotic features. Patient presented with appropriate affect, level mood and denied any suicidal or homicidal ideations.Patient reported no change in alcohol/substance use, caffeine consumption, sleep pattern or metal implant status since previous tx. Pt. said his sinus' are giving him trouble; saw  a ENT today. They started him on a new antibiotic and prednisone. He is hopeful it will help. Pt reported no sedation or headache after treatments. Power was titrated to 120%. Pt had no complaints. PHQ-9 a 15 today.

## 2022-02-14 ENCOUNTER — Encounter: Payer: Self-pay | Admitting: Family Medicine

## 2022-02-14 ENCOUNTER — Other Ambulatory Visit (HOSPITAL_COMMUNITY): Payer: 59 | Attending: Psychiatry | Admitting: *Deleted

## 2022-02-14 DIAGNOSIS — F332 Major depressive disorder, recurrent severe without psychotic features: Secondary | ICD-10-CM | POA: Diagnosis not present

## 2022-02-14 NOTE — Progress Notes (Signed)
Patient reported to [facility] for his 17th session of Repetitive Transcranial Magnetic Stimulation treatment for severe episode of recurrent major depressive disorder, without psychotic features. Patient presented with appropriate affect, level mood and denied any suicidal or homicidal ideations.Patient reported no change in alcohol/substance use, caffeine consumption, sleep pattern or metal implant status since previous tx. Pt. said his sinus' are giving him trouble. He is hopeful it will help. Pt reported no sedation or headache after treatments. Power was titrated to 120%. Pt had no complaints.

## 2022-02-15 ENCOUNTER — Other Ambulatory Visit (HOSPITAL_BASED_OUTPATIENT_CLINIC_OR_DEPARTMENT_OTHER): Payer: Self-pay

## 2022-02-15 ENCOUNTER — Other Ambulatory Visit (HOSPITAL_COMMUNITY): Payer: 59 | Attending: Psychiatry | Admitting: *Deleted

## 2022-02-15 DIAGNOSIS — F332 Major depressive disorder, recurrent severe without psychotic features: Secondary | ICD-10-CM | POA: Diagnosis not present

## 2022-02-15 MED ORDER — ISOTRETINOIN 40 MG PO CAPS
40.0000 mg | ORAL_CAPSULE | Freq: Every day | ORAL | 0 refills | Status: DC
  Filled 2022-02-15: qty 30, 30d supply, fill #0

## 2022-02-15 NOTE — Progress Notes (Signed)
Patient reported to [facility] for his 18th session of Repetitive Transcranial Magnetic Stimulation treatment for severe episode of recurrent major depressive disorder, without psychotic features. Patient presented with appropriate affect, level mood and denied any suicidal or homicidal ideations.Patient reported no change in alcohol/substance use, caffeine consumption, sleep pattern or metal implant status since previous tx. Pt. said his sinus' are giving him trouble. Despite antibiotics, he feels about the same. Pt reported no sedation or headache after treatments. Power was titrated to 120%. Pt had no complaints.

## 2022-02-16 ENCOUNTER — Other Ambulatory Visit (HOSPITAL_BASED_OUTPATIENT_CLINIC_OR_DEPARTMENT_OTHER): Payer: Self-pay

## 2022-02-16 ENCOUNTER — Other Ambulatory Visit (HOSPITAL_COMMUNITY): Payer: 59 | Attending: Psychiatry | Admitting: *Deleted

## 2022-02-16 DIAGNOSIS — F332 Major depressive disorder, recurrent severe without psychotic features: Secondary | ICD-10-CM | POA: Diagnosis not present

## 2022-02-16 NOTE — Progress Notes (Signed)
Patient reported to [facility] for his 19th session of Repetitive Transcranial Magnetic Stimulation treatment for severe episode of recurrent major depressive disorder, without psychotic features. Patient presented with brighter affect, level mood and denied any suicidal or homicidal ideations.Patient reported no change in alcohol/substance use, caffeine consumption, sleep pattern or metal implant status since previous tx. Pt. sounded less congested today; hoping the antibiotics are starting to work. Pt reported no sedation or headache after treatments. Power was titrated to 120%. Pt had no complaints

## 2022-02-17 ENCOUNTER — Other Ambulatory Visit (HOSPITAL_BASED_OUTPATIENT_CLINIC_OR_DEPARTMENT_OTHER): Payer: Self-pay

## 2022-02-17 ENCOUNTER — Other Ambulatory Visit (HOSPITAL_COMMUNITY): Payer: 59 | Attending: Psychiatry | Admitting: *Deleted

## 2022-02-17 ENCOUNTER — Other Ambulatory Visit (INDEPENDENT_AMBULATORY_CARE_PROVIDER_SITE_OTHER): Payer: Self-pay | Admitting: Family Medicine

## 2022-02-17 DIAGNOSIS — F332 Major depressive disorder, recurrent severe without psychotic features: Secondary | ICD-10-CM

## 2022-02-17 DIAGNOSIS — E8881 Metabolic syndrome: Secondary | ICD-10-CM

## 2022-02-17 DIAGNOSIS — E669 Obesity, unspecified: Secondary | ICD-10-CM

## 2022-02-17 NOTE — Progress Notes (Signed)
Patient reported to [facility] for his 20th session of Repetitive Transcranial Magnetic Stimulation treatment for severe episode of recurrent major depressive disorder, without psychotic features. Patient presented with flat affect, level mood and denied any suicidal or homicidal ideations.Patient reported no change in alcohol/substance use, caffeine consumption, sleep pattern or metal implant status since previous tx. Pt. sounded less congested today. Pt reported no sedation or headache after treatments. Power was titrated to 120%. Pt had no complaints

## 2022-02-18 ENCOUNTER — Encounter (HOSPITAL_COMMUNITY): Payer: 59

## 2022-02-21 ENCOUNTER — Other Ambulatory Visit (HOSPITAL_COMMUNITY): Payer: 59 | Attending: Psychiatry | Admitting: *Deleted

## 2022-02-21 DIAGNOSIS — F332 Major depressive disorder, recurrent severe without psychotic features: Secondary | ICD-10-CM

## 2022-02-21 NOTE — Progress Notes (Signed)
Patient reported to [facility] for his 21st session of Repetitive Transcranial Magnetic Stimulation treatment for severe episode of recurrent major depressive disorder, without psychotic features. Patient presented with flat affect, level mood and denied any suicidal or homicidal ideations.Patient reported no change in alcohol/substance use, caffeine consumption, sleep pattern or metal implant status since previous tx. Pt. coughed throughout treatment today. Had a good visit with his grandparents this weekend. Pt reported no sedation or headache after treatments. Power was titrated to 120%. Pt had no complaints

## 2022-02-22 ENCOUNTER — Other Ambulatory Visit (HOSPITAL_COMMUNITY): Payer: 59 | Attending: Psychiatry | Admitting: *Deleted

## 2022-02-22 ENCOUNTER — Encounter: Payer: Self-pay | Admitting: Family Medicine

## 2022-02-22 ENCOUNTER — Other Ambulatory Visit (HOSPITAL_BASED_OUTPATIENT_CLINIC_OR_DEPARTMENT_OTHER): Payer: Self-pay

## 2022-02-22 DIAGNOSIS — F332 Major depressive disorder, recurrent severe without psychotic features: Secondary | ICD-10-CM

## 2022-02-22 LAB — ANTI-NUCLEAR AB-TITER (ANA TITER): ANA Titer 1: 1:80 {titer} — ABNORMAL HIGH

## 2022-02-22 LAB — ANA,IFA RA DIAG PNL W/RFLX TIT/PATN
Anti Nuclear Antibody (ANA): POSITIVE — AB
Cyclic Citrullin Peptide Ab: <16 UNITS
Rheumatoid fact SerPl-aCnc: <14 IU/mL (ref <14)

## 2022-02-22 LAB — ANTI-DNA ANTIBODY, DOUBLE-STRANDED: ds DNA Ab: <1 IU/mL

## 2022-02-22 LAB — C-REACTIVE PROTEIN: CRP: 2.4 mg/L (ref <8.0)

## 2022-02-22 LAB — ANTI-SMITH ANTIBODY: ENA SM Ab Ser-aCnc: <1 AI

## 2022-02-22 LAB — SEDIMENTATION RATE: Sed Rate: 2 mm/h (ref 0–15)

## 2022-02-22 LAB — SJOGRENS SYNDROME-B EXTRACTABLE NUCLEAR ANTIBODY: SSB (La) (ENA) Antibody, IgG: <1 AI

## 2022-02-22 LAB — URIC ACID: Uric Acid, Serum: 5.6 mg/dL (ref 4.0–8.0)

## 2022-02-22 LAB — ALDOLASE

## 2022-02-22 LAB — SJOGRENS SYNDROME-A EXTRACTABLE NUCLEAR ANTIBODY: SSA (Ro) (ENA) Antibody, IgG: <1 AI

## 2022-02-22 MED ORDER — OMEPRAZOLE 40 MG PO CPDR
40.0000 mg | DELAYED_RELEASE_CAPSULE | Freq: Every day | ORAL | 0 refills | Status: DC
  Filled 2022-02-22: qty 30, 30d supply, fill #0

## 2022-02-22 MED ORDER — FAMOTIDINE 40 MG PO TABS
40.0000 mg | ORAL_TABLET | Freq: Every day | ORAL | 0 refills | Status: DC
  Filled 2022-02-22: qty 30, 30d supply, fill #0

## 2022-02-22 NOTE — Progress Notes (Signed)
Patient reported to [facility] for his 22nd session of Repetitive Transcranial Magnetic Stimulation treatment for severe episode of recurrent major depressive disorder, without psychotic features. Patient presented with flat affect, but brightens during treatment, level mood and denied any suicidal or homicidal ideations.Patient reported no change in alcohol/substance use, caffeine consumption, sleep pattern or metal implant status since previous tx. Pt contacted his ENT and was started on Prilosec for acid reflux, which may be the cause of his cough/congestion. Pt reported no sedation or headache after treatments. Power was titrated to 120%. Pt had no complaints.

## 2022-02-23 ENCOUNTER — Other Ambulatory Visit (INDEPENDENT_AMBULATORY_CARE_PROVIDER_SITE_OTHER): Payer: Self-pay | Admitting: Family Medicine

## 2022-02-23 ENCOUNTER — Encounter (INDEPENDENT_AMBULATORY_CARE_PROVIDER_SITE_OTHER): Payer: Self-pay | Admitting: Family Medicine

## 2022-02-23 ENCOUNTER — Telehealth: Payer: Self-pay | Admitting: *Deleted

## 2022-02-23 ENCOUNTER — Other Ambulatory Visit (HOSPITAL_BASED_OUTPATIENT_CLINIC_OR_DEPARTMENT_OTHER): Payer: Self-pay

## 2022-02-23 ENCOUNTER — Telehealth (HOSPITAL_COMMUNITY): Payer: Self-pay | Admitting: Psychiatry

## 2022-02-23 ENCOUNTER — Ambulatory Visit (INDEPENDENT_AMBULATORY_CARE_PROVIDER_SITE_OTHER): Payer: 59 | Admitting: Family Medicine

## 2022-02-23 ENCOUNTER — Other Ambulatory Visit (HOSPITAL_COMMUNITY): Payer: 59 | Attending: Psychiatry | Admitting: *Deleted

## 2022-02-23 ENCOUNTER — Telehealth (INDEPENDENT_AMBULATORY_CARE_PROVIDER_SITE_OTHER): Payer: 59 | Admitting: Family Medicine

## 2022-02-23 DIAGNOSIS — R632 Polyphagia: Secondary | ICD-10-CM | POA: Diagnosis not present

## 2022-02-23 DIAGNOSIS — E669 Obesity, unspecified: Secondary | ICD-10-CM

## 2022-02-23 DIAGNOSIS — F339 Major depressive disorder, recurrent, unspecified: Secondary | ICD-10-CM

## 2022-02-23 DIAGNOSIS — E559 Vitamin D deficiency, unspecified: Secondary | ICD-10-CM

## 2022-02-23 DIAGNOSIS — F332 Major depressive disorder, recurrent severe without psychotic features: Secondary | ICD-10-CM

## 2022-02-23 DIAGNOSIS — K219 Gastro-esophageal reflux disease without esophagitis: Secondary | ICD-10-CM | POA: Diagnosis not present

## 2022-02-23 DIAGNOSIS — Z6830 Body mass index (BMI) 30.0-30.9, adult: Secondary | ICD-10-CM

## 2022-02-23 MED ORDER — WEGOVY 1.7 MG/0.75ML ~~LOC~~ SOAJ
1.7000 mg | SUBCUTANEOUS | 0 refills | Status: DC
  Filled 2022-02-23: qty 3, 28d supply, fill #0

## 2022-02-23 NOTE — Progress Notes (Signed)
TeleHealth Visit:  This visit was completed with telemedicine (audio/video) technology. Albert [name] has verbally consented to this TeleHealth visit. The patient is located at home, the provider is located at home. The participants in this visit include the listed provider and patient. The visit was conducted today via MyChart video.  OBESITY Albert [name] is here to discuss his progress with his obesity treatment plan along with follow-up of his obesity related diagnoses.   Today's visit was # 21 Starting weight: 236 lbs Starting date: 05/28/2019 Weight at last in office visit: 207 lbs on 01/17/22 Total weight loss: 29 lbs at last in office visit on 01/17/22. Today's reported weight: 203 lbs   Nutrition Plan: keeping a food journal and adhering to recommended goals of 1900-2000 calories and 155 grams protein daily.  Current exercise: weight training 120 minutes 5 times per week. Walks for 45 minutes 4 days per week.   Interim History: Albert [name] has been struggling recently with recurrent episode of depression.  However he has been journaling consistently.  He is unable to consistently hit 155 g of protein but he does often get in 140 g but tends to go over on calories sometimes.  Reported weight today reflects 4 pound weight loss. He is consistently doing weight training and walking.  He has been plagued by health issues recently-restarted Accutane due to cystic acne groin/buttocks area, sinus/ear issues, positive ANA test (negative for RA).  He will be having an upcoming CT of the head, GERD.  He request that his next visit be virtual.  Assessment/Plan:  1.  Recurrent major depressive disorder He has had 23 out of a cycle of 36 TMS treatments.  He is unsure if this is helped but he is feeling much better than he was.  However he reports these episodes of depression are cyclical for him.  He plans on pursuing ECT if Albert [name] is not effective. He is on Librium 10 mg nightly as needed for anxiety,  phenelzine 15 mg 3 times daily. Sees his psychiatrist every 4 to 6 weeks, psychoanalyst weekly, and therapist every other week.  Denies suicidal/homicidal ideation.  Plan: Continue medications as directed. Follow-up with therapist, psychoanalyst, and psychiatrist as directed  2. GERD Has had issues with GERD ever since he has been on the [facility] but only recently connected the symptoms.  He is on Pepcid and omeprazole.  Has symptoms day and night.  Plan: Continue Prilosec and Pepcid as ordered Chew Tums or take Mylanta at bedtime. Avoid eating 3 hours before bedtime. Avoid trigger foods. Reduce dose of Wegovy to 1.7 mg weekly.  3. Polyphagia Hunger well-controlled but has severe GERD symptoms.  On Wegovy 2.4 mg weekly.  Plan: Reduce dose Wegovy to 1.7 mg weekly and refill.  Obesity: Current BMI 30.5 Albert [name] is currently in the action stage of change. As such, his goal is to continue with weight loss efforts.  He has agreed to keeping a food journal and adhering to recommended goals of 1900-2000 calories and 155 grams protein daily.  Exercise goals: as is  Behavioral modification strategies: increasing lean protein intake, planning for success, and keeping a strict food journal.  [name] has agreed to follow-up with our clinic in 3 weeks.   No orders of the defined types were placed in this encounter.   Medications Discontinued During This Encounter  Medication Reason   Semaglutide-Weight Management (WEGOVY) 2.4 MG/0.75ML SOAJ Dose change     Meds ordered this encounter  Medications   Semaglutide-Weight Management (WEGOVY) 1.7 MG/0.75ML  SOAJ    Sig: Inject 1.7 mg into the skin once a week.    Dispense:  3 mL    Refill:  0    Order Specific Question:   Supervising Provider    Answer:   Dell Ponto [[id number]]      Objective:   VITALS: Per patient if applicable, see vitals. GENERAL: Alert and in no acute distress. CARDIOPULMONARY: No increased WOB. Speaking in clear  sentences.  PSYCH: [name] and cooperative. Speech normal rate and rhythm. Affect is appropriate. Insight and judgement are appropriate. Attention is focused, linear, and appropriate.  NEURO: Oriented as arrived to appointment on time with no prompting.   Lab Results  Component Value Date   CREATININE 1.07 11/11/2021   BUN 15 11/11/2021   NA 140 11/11/2021   K 4.1 11/11/2021   CL 102 11/11/2021   CO2 22 11/11/2021   Lab Results  Component Value Date   ALT 32 11/11/2021   AST 31 11/11/2021   ALKPHOS 68 11/11/2021   BILITOT 0.5 11/11/2021   Lab Results  Component Value Date   HGBA1C 4.7 (L) 11/11/2021   HGBA1C 5.0 02/03/2020   HGBA1C 4.8 05/28/2019   Lab Results  Component Value Date   INSULIN 16.5 11/11/2021   INSULIN 6.2 02/03/2020   INSULIN 19.0 05/28/2019   Lab Results  Component Value Date   TSH 0.346 (L) 02/03/2020   Lab Results  Component Value Date   CHOL 127 11/03/2020   HDL 47.70 11/03/2020   LDLCALC 63 11/03/2020   TRIG 80.0 11/03/2020   CHOLHDL 3 11/03/2020   Lab Results  Component Value Date   WBC 10.3 04/21/2021   HGB 14.9 04/21/2021   HCT 44.1 04/21/2021   MCV 88.9 04/21/2021   PLT 209 04/21/2021   No results found for: "IRON", "TIBC", "FERRITIN" Lab Results  Component Value Date   VD25OH 54.6 11/11/2021   VD25OH 97.9 03/22/2021   VD25OH 65.6 02/03/2020    Attestation Statements:   Reviewed by clinician on day of visit: allergies, medications, problem list, medical history, surgical history, family history, social history, and previous encounter notes.

## 2022-02-23 NOTE — Telephone Encounter (Signed)
We received a fax from [facility] stating Aldolase level from 9/15 cannot be preformed because " due to a laboratory error, the specimen was inadvertently routed to the incorrect department or laboratory and is no longer valid for testing".  Please advise?

## 2022-02-23 NOTE — Progress Notes (Signed)
Patient reported to [facility] for his 23rd session of Repetitive Transcranial Magnetic Stimulation treatment for severe episode of recurrent major depressive disorder, without psychotic features. Patient presented with flat affect, but brightens during treatment, level mood and denied any suicidal or homicidal ideations.Patient reported no change in alcohol/substance use, caffeine use. Pt reported no sedation or headache after treatments. Power was titrated to 120%. Pt had no complaints.

## 2022-02-24 ENCOUNTER — Other Ambulatory Visit (HOSPITAL_BASED_OUTPATIENT_CLINIC_OR_DEPARTMENT_OTHER): Payer: Self-pay

## 2022-02-24 ENCOUNTER — Other Ambulatory Visit (HOSPITAL_COMMUNITY): Payer: 59 | Attending: Psychiatry | Admitting: *Deleted

## 2022-02-24 ENCOUNTER — Ambulatory Visit (INDEPENDENT_AMBULATORY_CARE_PROVIDER_SITE_OTHER): Payer: 59 | Admitting: Family Medicine

## 2022-02-24 DIAGNOSIS — F332 Major depressive disorder, recurrent severe without psychotic features: Secondary | ICD-10-CM | POA: Diagnosis not present

## 2022-02-24 NOTE — Progress Notes (Signed)
Patient reported to [facility] for his 24th session of Repetitive Transcranial Magnetic Stimulation treatment for severe episode of recurrent major depressive disorder, without psychotic features. Patient presented with flat affect, but brightens during treatment, level mood and denied any suicidal or homicidal ideations.Patient reported no change in alcohol/substance use, caffeine use. Pt reported no sedation or headache after treatments. Power was titrated to 120%. Pt had no complaints.

## 2022-02-25 ENCOUNTER — Other Ambulatory Visit (HOSPITAL_COMMUNITY): Payer: 59 | Attending: Psychiatry | Admitting: *Deleted

## 2022-02-25 ENCOUNTER — Other Ambulatory Visit (HOSPITAL_BASED_OUTPATIENT_CLINIC_OR_DEPARTMENT_OTHER): Payer: Self-pay

## 2022-02-25 DIAGNOSIS — F332 Major depressive disorder, recurrent severe without psychotic features: Secondary | ICD-10-CM

## 2022-02-25 NOTE — Progress Notes (Signed)
Patient reported to [facility] for his 25th session of Repetitive Transcranial Magnetic Stimulation treatment for severe episode of recurrent major depressive disorder, without psychotic features. Patient presented with bright affect.. reports he is finally getting some relief from his physical health issues. denied any suicidal or homicidal ideations.Patient reported no change in alcohol/substance use, caffeine use. Pt reported no sedation or headache after treatments. Power was titrated to 120%. Pt had no complaints. PHQ-9 is a 18 today, but states it's mainly due to residual physical health issues, though resolving.

## 2022-02-28 ENCOUNTER — Other Ambulatory Visit (HOSPITAL_BASED_OUTPATIENT_CLINIC_OR_DEPARTMENT_OTHER): Payer: Self-pay

## 2022-02-28 ENCOUNTER — Other Ambulatory Visit (HOSPITAL_COMMUNITY): Payer: 59 | Attending: Psychiatry | Admitting: *Deleted

## 2022-02-28 DIAGNOSIS — F332 Major depressive disorder, recurrent severe without psychotic features: Secondary | ICD-10-CM | POA: Diagnosis not present

## 2022-02-28 NOTE — Progress Notes (Signed)
Patient reported to [facility] for his 26th session of Repetitive Transcranial Magnetic Stimulation treatment for severe episode of recurrent major depressive disorder, without psychotic features. Patient presented with bright affect.. reports he is finally getting some relief from his physical health issues. denied any suicidal or homicidal ideations.Patient reported no change in alcohol/substance use, caffeine use. Pt reported no sedation or headache after treatments. Power was titrated to 120%. Pt had no complaints. Pt had a good weekend. Did a lot of exercising, played D&D with friends.

## 2022-03-01 ENCOUNTER — Other Ambulatory Visit (HOSPITAL_COMMUNITY): Payer: 59 | Attending: Psychiatry | Admitting: *Deleted

## 2022-03-01 DIAGNOSIS — F332 Major depressive disorder, recurrent severe without psychotic features: Secondary | ICD-10-CM | POA: Diagnosis not present

## 2022-03-01 NOTE — Progress Notes (Signed)
Patient reported to [facility] for his 27th session of Repetitive Transcranial Magnetic Stimulation treatment for severe episode of recurrent major depressive disorder, without psychotic features. Patient presented with bright affect.. reports he is finally getting some relief from his physical health issues. denied any suicidal or homicidal ideations.Patient reported no change in alcohol/substance use, caffeine use. Pt reported no sedation or headache after treatments. Power was titrated to 120%. Pt had no complaints.

## 2022-03-02 ENCOUNTER — Other Ambulatory Visit (HOSPITAL_COMMUNITY): Payer: 59 | Attending: Psychiatry | Admitting: *Deleted

## 2022-03-02 DIAGNOSIS — F332 Major depressive disorder, recurrent severe without psychotic features: Secondary | ICD-10-CM | POA: Diagnosis not present

## 2022-03-02 NOTE — Progress Notes (Signed)
Patient reported to [facility] for his 28th session of Repetitive Transcranial Magnetic Stimulation treatment for severe episode of recurrent major depressive disorder, without psychotic features. Patient presented with bright affect. Reports he is finally getting some relief from his physical health issues. He is looking forward to a beach trip next week. He denied any suicidal or homicidal ideations.Patient reported no change in alcohol/substance use, caffeine use. Pt reported no sedation or headache after treatments. Power was titrated to 120%. Pt had no complaints.

## 2022-03-03 ENCOUNTER — Other Ambulatory Visit (HOSPITAL_COMMUNITY): Payer: 59 | Attending: Psychiatry | Admitting: *Deleted

## 2022-03-03 DIAGNOSIS — F332 Major depressive disorder, recurrent severe without psychotic features: Secondary | ICD-10-CM | POA: Diagnosis not present

## 2022-03-03 NOTE — Progress Notes (Signed)
Patient reported to [facility] for his 29th session of Repetitive Transcranial Magnetic Stimulation treatment for severe episode of recurrent major depressive disorder, without psychotic features. Patient presented with bright affect. Reports he is finally getting some relief from his physical health issues. He is looking forward to a beach trip next week. He denied any suicidal or homicidal ideations.Patient reported no change in alcohol/substance use, caffeine use. Pt reported no sedation or headache after treatments. Power was titrated to 120%. Pt had no complaints.

## 2022-03-04 ENCOUNTER — Encounter (HOSPITAL_COMMUNITY): Payer: 59

## 2022-03-04 ENCOUNTER — Other Ambulatory Visit (HOSPITAL_COMMUNITY): Payer: 59 | Attending: Psychiatry

## 2022-03-04 DIAGNOSIS — F332 Major depressive disorder, recurrent severe without psychotic features: Secondary | ICD-10-CM | POA: Diagnosis not present

## 2022-03-04 NOTE — Progress Notes (Signed)
Patient reported to [facility] for his 30th session of Repetitive Transcranial Magnetic Stimulation treatment for severe episode of recurrent major depressive disorder, without psychotic features. Patient presented with bright affect. Reports he is still feeling better since starting on Prilosec. He is looking forward to playing D&D with his friends tonight. He denied any suicidal or homicidal ideations.Patient reported no change in alcohol/substance use, caffeine use. Pt reported no sedation or headache after treatments. Power was titrated to 120%. Pt had no complaints.

## 2022-03-07 ENCOUNTER — Other Ambulatory Visit (HOSPITAL_COMMUNITY): Payer: 59 | Attending: Psychiatry | Admitting: *Deleted

## 2022-03-07 DIAGNOSIS — F332 Major depressive disorder, recurrent severe without psychotic features: Secondary | ICD-10-CM | POA: Diagnosis not present

## 2022-03-07 DIAGNOSIS — F32A Depression, unspecified: Secondary | ICD-10-CM | POA: Diagnosis not present

## 2022-03-07 DIAGNOSIS — F411 Generalized anxiety disorder: Secondary | ICD-10-CM | POA: Diagnosis not present

## 2022-03-07 NOTE — Progress Notes (Signed)
Patient reported to [facility] for his 31th session of Repetitive Transcranial Magnetic Stimulation treatment for severe episode of recurrent major depressive disorder, without psychotic features. Patient presented with bright affect. Reports he is still feeling better since starting on Prilosec. He had a good weekend; worked on a Friday. He denied any suicidal or homicidal ideations.Patient reported no change in alcohol/substance use, caffeine use. Pt reported no sedation or headache after treatments. Power was titrated to 120%. Pt had no complaints.

## 2022-03-08 ENCOUNTER — Other Ambulatory Visit (HOSPITAL_COMMUNITY): Payer: 59 | Attending: Psychiatry | Admitting: *Deleted

## 2022-03-08 DIAGNOSIS — F332 Major depressive disorder, recurrent severe without psychotic features: Secondary | ICD-10-CM

## 2022-03-08 NOTE — Progress Notes (Signed)
Patient reported to [facility] for his 32nd session of Repetitive Transcranial Magnetic Stimulation treatment for severe episode of recurrent major depressive disorder, without psychotic features. Patient presented with bright affect. Reports he is still feeling better since starting on Prilosec. Pt is excited about going to the beach with family starting tomorrow. He denied any suicidal or homicidal ideations.Patient reported no change in alcohol/substance use, caffeine use. Pt reported no sedation or headache after treatments. Power was titrated to 120%. Pt had no complaints.

## 2022-03-09 ENCOUNTER — Telehealth (HOSPITAL_COMMUNITY): Payer: Self-pay | Admitting: Psychiatry

## 2022-03-09 NOTE — Telephone Encounter (Signed)
D:  It was requested that the [place] Interim Coordinator reach out to pt to inquire about scheduling his four taper treatments; since pt is currently out of town for the rest of the week.  A:  Placed call to pt.  Inquired how he was currently feeling?  "I feel a little better, but there's been positive changes in my life's situation; so I don't know if I would attribute feeling better d/t the [place] or if it's my changed situation."  Pt went on to add, maybe it's a bit of both, but he feels somewhat better.  According to pt, he is good with next week tapering or he can wait until 03-24-22 upon Debarah Crape, RN, return from vacation.  Coordinator informed pt that she would touch bases with the [place] team and get back with him to schedule.  Inform [place] team.  R:  Pt receptive.

## 2022-03-10 NOTE — Progress Notes (Signed)
TeleHealth Visit:  This visit was completed with telemedicine (audio/video) technology. [name] has verbally consented to this TeleHealth visit. The patient is located at home, the provider is located at home. The participants in this visit include the listed provider and patient. The visit was conducted today via MyChart video.  OBESITY Albert [name] is here to discuss his progress with his obesity treatment plan along with follow-up of his obesity related diagnoses.   Today's visit was # 14 Starting weight: 236 lbs Starting date: 05/28/2019 Weight at last in office visit: 207 lbs on 01/17/22 Total weight loss: 29 lbs at last in office visit on 01/17/22. Weight reported last virtual office visit: 203 pounds Today's reported weight: 203 lbs   Nutrition Plan: keeping a food journal and adhering to recommended goals of 1900-2000 calories and 155 grams protein daily.  Current exercise:  weight training 120 minutes 4 times per week. Walks for 45 minutes 2-3 days per week.   Interim History: [name] recently went on a trip to [facility] but has maintained his weight.  He plans to restart journaling today.  He has been very consistent with exercise and journaling despite recurrent episode of depression. He said he almost always gets at least 120 g of protein daily.  He has had chronic sinusitis for the past 7 to 8 months and has upcoming head CT.  Sees a provider at [facility], PA-C    Assessment/Plan:  1.  Recurrent major depressive disorder He has been undergoing TMS and has done 32 treatments.  He will have 4 more in November.  He does not feel this has helped and will discuss the possibility of ECT with his psychiatrist mid November.  He is seeing a psychoanalyst weekly that he has to pay out-of-pocket for and also seeing his regular counselor but plans to stop seeing his regular counselor. He is doing a good job with self-care despite the depression. Denies suicidal/homicidal  ideation.  Plan: Follow-up with psychiatrist as directed-he reports next appointment is mid November. Continue to follow-up with counselor/psychoanalyst as directed. Continue all medications as directed.  2. Vitamin D Deficiency Vitamin D is at goal of 50.  Last vitamin D level was 54 on 11/11/2021. He is on weekly prescription Vitamin D 50,000 IU.  Lab Results  Component Value Date   VD25OH 54.6 11/11/2021   VD25OH 97.9 03/22/2021   VD25OH 65.6 02/03/2020    Plan: Refill prescription vitamin D 50,000 IU weekly.   3. Polyphagia Appetite well controlled with Wegovy 2.4 mg weekly.  Denies side effects.  Plan: Refill Wegovy 2.4 mg weekly.   4. Obesity: Current BMI 29.96 [name] is currently in the action stage of change. As such, his goal is to continue with weight loss efforts.  He has agreed to keeping a food journal and adhering to recommended goals of 1900-2000 calories and 155 grams protein daily.   Exercise goals:  as is  Behavioral modification strategies: increasing lean protein intake, decreasing simple carbohydrates, and planning for success.  [name] has agreed to follow-up with our clinic in 4 weeks.   No orders of the defined types were placed in this encounter.   Medications Discontinued During This Encounter  Medication Reason   Vitamin D, Ergocalciferol, (DRISDOL) 1.25 MG (50000 UNIT) CAPS capsule Reorder   Semaglutide-Weight Management (WEGOVY) 1.7 MG/0.75ML SOAJ Reorder     Meds ordered this encounter  Medications   Semaglutide-Weight Management (WEGOVY) 1.7 MG/0.75ML SOAJ    Sig: Inject 1.7 mg into the skin  once a week.    Dispense:  3 mL    Refill:  0    Order Specific Question:   Supervising Provider    Answer:   Netty Starring   Vitamin D, Ergocalciferol, (DRISDOL) 1.25 MG (50000 UNIT) CAPS capsule    Sig: Take 1 capsule (50,000 Units total) by mouth every 7 (seven) days.    Dispense:  4 capsule    Refill:  0    Order Specific  Question:   Supervising Provider    Answer:   Dell Ponto [[id number]]      Objective:   VITALS: Per patient if applicable, see vitals. GENERAL: Alert and in no acute distress. CARDIOPULMONARY: No increased WOB. Speaking in clear sentences.  PSYCH: Pleasant and cooperative. Speech normal rate and rhythm. Affect is appropriate. Insight and judgement are appropriate. Attention is focused, linear, and appropriate.  NEURO: Oriented as arrived to appointment on time with no prompting.   Lab Results  Component Value Date   CREATININE 1.07 11/11/2021   BUN 15 11/11/2021   NA 140 11/11/2021   K 4.1 11/11/2021   CL 102 11/11/2021   CO2 22 11/11/2021   Lab Results  Component Value Date   ALT 32 11/11/2021   AST 31 11/11/2021   ALKPHOS 68 11/11/2021   BILITOT 0.5 11/11/2021   Lab Results  Component Value Date   HGBA1C 4.7 (L) 11/11/2021   HGBA1C 5.0 02/03/2020   HGBA1C 4.8 05/28/2019   Lab Results  Component Value Date   INSULIN 16.5 11/11/2021   INSULIN 6.2 02/03/2020   INSULIN 19.0 05/28/2019   Lab Results  Component Value Date   TSH 0.346 (L) 02/03/2020   Lab Results  Component Value Date   CHOL 127 11/03/2020   HDL 47.70 11/03/2020   LDLCALC 63 11/03/2020   TRIG 80.0 11/03/2020   CHOLHDL 3 11/03/2020   Lab Results  Component Value Date   WBC 10.3 04/21/2021   HGB 14.9 04/21/2021   HCT 44.1 04/21/2021   MCV 88.9 04/21/2021   PLT 209 04/21/2021   No results found for: "IRON", "TIBC", "FERRITIN" Lab Results  Component Value Date   VD25OH 54.6 11/11/2021   VD25OH 97.9 03/22/2021   VD25OH 65.6 02/03/2020    Attestation Statements:   Reviewed by clinician on day of visit: allergies, medications, problem list, medical history, surgical history, family history, social history, and previous encounter notes.

## 2022-03-14 ENCOUNTER — Telehealth (INDEPENDENT_AMBULATORY_CARE_PROVIDER_SITE_OTHER): Payer: 59 | Admitting: Family Medicine

## 2022-03-14 ENCOUNTER — Other Ambulatory Visit (HOSPITAL_BASED_OUTPATIENT_CLINIC_OR_DEPARTMENT_OTHER): Payer: Self-pay

## 2022-03-14 ENCOUNTER — Encounter (INDEPENDENT_AMBULATORY_CARE_PROVIDER_SITE_OTHER): Payer: Self-pay | Admitting: Family Medicine

## 2022-03-14 VITALS — Ht 69.0 in | Wt 203.0 lb

## 2022-03-14 DIAGNOSIS — E559 Vitamin D deficiency, unspecified: Secondary | ICD-10-CM

## 2022-03-14 DIAGNOSIS — E669 Obesity, unspecified: Secondary | ICD-10-CM

## 2022-03-14 DIAGNOSIS — R632 Polyphagia: Secondary | ICD-10-CM

## 2022-03-14 DIAGNOSIS — F339 Major depressive disorder, recurrent, unspecified: Secondary | ICD-10-CM

## 2022-03-14 DIAGNOSIS — Z6829 Body mass index (BMI) 29.0-29.9, adult: Secondary | ICD-10-CM | POA: Diagnosis not present

## 2022-03-14 MED ORDER — WEGOVY 1.7 MG/0.75ML ~~LOC~~ SOAJ
1.7000 mg | SUBCUTANEOUS | 0 refills | Status: DC
  Filled 2022-03-14 – 2022-03-23 (×2): qty 3, 28d supply, fill #0

## 2022-03-14 MED ORDER — VITAMIN D (ERGOCALCIFEROL) 1.25 MG (50000 UNIT) PO CAPS
50000.0000 [IU] | ORAL_CAPSULE | ORAL | 0 refills | Status: DC
  Filled 2022-03-14: qty 4, 28d supply, fill #0

## 2022-03-15 ENCOUNTER — Other Ambulatory Visit (HOSPITAL_BASED_OUTPATIENT_CLINIC_OR_DEPARTMENT_OTHER): Payer: Self-pay | Admitting: Anesthesiology

## 2022-03-15 DIAGNOSIS — J324 Chronic pansinusitis: Secondary | ICD-10-CM

## 2022-03-16 ENCOUNTER — Ambulatory Visit (HOSPITAL_BASED_OUTPATIENT_CLINIC_OR_DEPARTMENT_OTHER)
Admission: RE | Admit: 2022-03-16 | Discharge: 2022-03-16 | Disposition: A | Discharge status: Home / Self Care | Payer: 59 | Source: Ambulatory Visit | Attending: Anesthesiology | Admitting: Anesthesiology

## 2022-03-16 DIAGNOSIS — J324 Chronic pansinusitis: Secondary | ICD-10-CM | POA: Insufficient documentation

## 2022-03-16 DIAGNOSIS — J3489 Other specified disorders of nose and nasal sinuses: Secondary | ICD-10-CM | POA: Diagnosis not present

## 2022-03-18 ENCOUNTER — Other Ambulatory Visit (HOSPITAL_BASED_OUTPATIENT_CLINIC_OR_DEPARTMENT_OTHER): Payer: Self-pay

## 2022-03-18 MED ORDER — PROMETHAZINE HCL 12.5 MG PO TABS
12.5000 mg | ORAL_TABLET | Freq: Three times a day (TID) | ORAL | 3 refills | Status: DC | PRN
  Filled 2022-03-18 (×2): qty 30, 10d supply, fill #0
  Filled 2022-06-03: qty 30, 10d supply, fill #1

## 2022-03-21 ENCOUNTER — Ambulatory Visit: Payer: 59 | Admitting: Family Medicine

## 2022-03-21 ENCOUNTER — Other Ambulatory Visit (HOSPITAL_BASED_OUTPATIENT_CLINIC_OR_DEPARTMENT_OTHER): Payer: Self-pay

## 2022-03-21 ENCOUNTER — Encounter: Payer: Self-pay | Admitting: Family Medicine

## 2022-03-21 VITALS — BP 108/76 | HR 79 | Temp 98.1°F | Ht 68.0 in | Wt 209.2 lb

## 2022-03-21 DIAGNOSIS — R42 Dizziness and giddiness: Secondary | ICD-10-CM

## 2022-03-21 MED ORDER — ONDANSETRON 4 MG PO TBDP
4.0000 mg | ORAL_TABLET | Freq: Three times a day (TID) | ORAL | 0 refills | Status: DC | PRN
  Filled 2022-03-21: qty 20, 7d supply, fill #0

## 2022-03-21 NOTE — Progress Notes (Signed)
Chief Complaint  Patient presents with   Dizziness    Nausea Should he take prescription Vitamin D    Albert [name] Self is 26 y.o. pt here for dizziness.  Duration: 2 weeks; worsened 3 d ago Pass out? No Can last up to the whole day.  Spinning? Yes Recent illness/fever? No Headache? Yes Neurologic signs? No Change in PO intake? No He does get associated headaches, nausea and light sensitivity.  No known hx of migraines.  No changes in stress levels.  Palpitations? No  Past Medical History:  Diagnosis Date   Anxiety with depression 09/30/2016   Bipolar disorder (HCC)    Constipation    History of PSVT (paroxysmal supraventricular tachycardia)    Hyperhidrosis    IBS (irritable bowel syndrome)    Joint pain    Low back pain    Major depression, melancholic type    Schizophrenia ([place])    Thyroid disorder    Vitamin D deficiency     Family History  Problem Relation Age of Onset   Anxiety disorder Mother    Depression Mother    Sudden death Neg Hx    Heart attack Neg Hx     Allergies as of 03/21/2022       Reactions   Sulfa Antibiotics    Family history of severe reactions        Medication List        Accurate as of March 21, 2022  4:40 PM. If you have any questions, ask your nurse or doctor.          STOP taking these medications    amoxicillin-clavulanate 875-125 MG tablet Commonly known as: AUGMENTIN Stopped by: Shelda Pal, DO   clindamycin 1 % gel Commonly known as: CLINDAGEL Stopped by: Shelda Pal, DO   doxycycline 100 MG capsule Commonly known as: MONODOX Stopped by: Shelda Pal, DO   levofloxacin 500 MG tablet Commonly known as: LEVAQUIN Stopped by: Shelda Pal, DO   levothyroxine 25 MCG tablet Commonly known as: SYNTHROID Stopped by: Shelda Pal, DO   liothyronine 5 MCG tablet Commonly known as: CYTOMEL Stopped by: Shelda Pal, DO   montelukast 10 MG  tablet Commonly known as: SINGULAIR Stopped by: Shelda Pal, DO   oxybutynin 5 MG tablet Commonly known as: DITROPAN Stopped by: Shelda Pal, DO   predniSONE 10 MG tablet Commonly known as: DELTASONE Stopped by: Shelda Pal, DO   Vitamin D (Ergocalciferol) 1.25 MG (50000 UNIT) Caps capsule Commonly known as: DRISDOL Stopped by: Shelda Pal, DO       TAKE these medications    chlordiazePOXIDE 10 MG capsule Commonly known as: LIBRIUM Take 1 capsule (10 mg total) by mouth at bedtime as needed for Anxiety   famotidine 40 MG tablet Commonly known as: PEPCID Take 1 tablet (40 mg total) by mouth daily.   levocetirizine 5 MG tablet Commonly known as: XYZAL Take 1 tablet (5 mg total) by mouth every evening.   Linzess 290 MCG Caps capsule Generic drug: linaclotide TAKE 1 CAPSULE (290 MCG TOTAL) BY MOUTH DAILY BEFORE BREAKFAST.   MethylFolate 400 MCG Caps Generic drug: Levomefolate Glucosamine Take by mouth.   omeprazole 40 MG capsule Commonly known as: PRILOSEC Take 1 capsule (40 mg total) by mouth daily.   ondansetron 4 MG disintegrating tablet Commonly known as: ZOFRAN-ODT Take 1 tablet (4 mg total) by mouth every 8 (eight) hours as needed for nausea or vomiting. Started  by: Shelda Pal, DO   phenelzine 15 MG tablet Commonly known as: NARDIL Take 2 tablets (30 mg total) by mouth 3 (three) times daily.   Potassium Citrate 15 MEQ (1620 MG) Tbcr Commonly known as: Urocit-K 15 Take 1 tablet by mouth 2 (two) times daily.   pramipexole 1 MG tablet Commonly known as: MIRAPEX Take 1 tablet (1 mg total) by mouth at bedtime.   promethazine 12.5 MG tablet Commonly known as: PHENERGAN Take 1 tablet (12.5 mg total) by mouth every 8 (eight) hours as needed for nausea.   pyridoxine 100 MG tablet Commonly known as: B-6 Take 100 mg by mouth daily.   Wegovy 1.7 MG/0.75ML Soaj Generic drug: Semaglutide-Weight  Management Inject 1.7 mg into the skin once a week.   Zenatane 40 MG capsule Generic drug: ISOtretinoin Take 1 capsule by mouth once a day        BP 108/76 (BP Location: Left Arm, Patient Position: Sitting, Cuff Size: Normal)   Pulse 79   Temp 98.1 F (36.7 C) (Oral)   Ht '5\' 8"'$  (1.727 m)   Wt 209 lb 4 oz (94.9 kg)   SpO2 99%   BMI 31.82 kg/m  General: Awake, alert, appears stated age Eyes: PERRLA, EOMi Ears: Patent, TM's neg b/l Heart: RRR, no murmurs, no carotid bruits Lungs: CTAB, no accessory muscle use MSK: 5/5 strength throughout, gait normal Neuro: No cerebellar signs, patellar reflex 1/4 b/l wo clonus, calcaneal reflex 0/4 b/l wo clonus, biceps reflex 1/4 b/l wo clonus; Dix-Hall-Pike negative b/l. Psych: Age appropriate judgment and insight, normal mood and affect  Dizziness - Plan: ondansetron (ZOFRAN-ODT) 4 MG disintegrating tablet  Excedrin prn as this could be a vest migraine. Zofran prn, OK to use w phenergan if needed.  With a normal neuro exam in a young person, this is unlikely to be anything sinister.  Negative Dix-Hallpike makes labyrinthitis and BPPV less likely, also not supported by history and age.  If no improvement in the next few days, he will send me a message and we will place ASAP referral to Neuro.  F/u prn. Pt voiced understanding and agreement to the plan.  Law[place], DO 03/21/22 4:40 PM

## 2022-03-21 NOTE — Patient Instructions (Signed)
Excedrin migraine as needed.   Zofran can be used with phenergan if needed.  Send me a message in a few days if no better.  Let us know if you need anything.

## 2022-03-23 ENCOUNTER — Other Ambulatory Visit: Payer: Self-pay | Admitting: Family Medicine

## 2022-03-23 ENCOUNTER — Other Ambulatory Visit (HOSPITAL_BASED_OUTPATIENT_CLINIC_OR_DEPARTMENT_OTHER): Payer: Self-pay

## 2022-03-23 MED ORDER — METHYLFOLATE 400 MCG PO CAPS
400.0000 | ORAL_CAPSULE | Freq: Every day | ORAL | 3 refills | Status: DC
  Filled 2022-03-23: qty 30, fill #0

## 2022-03-23 MED ORDER — FAMOTIDINE 40 MG PO TABS
40.0000 mg | ORAL_TABLET | Freq: Every day | ORAL | 5 refills | Status: DC
  Filled 2022-03-23: qty 30, 30d supply, fill #0
  Filled 2022-04-22: qty 30, 30d supply, fill #1
  Filled 2022-06-03: qty 30, 30d supply, fill #2

## 2022-03-23 MED ORDER — OMEPRAZOLE 40 MG PO CPDR
40.0000 mg | DELAYED_RELEASE_CAPSULE | Freq: Every day | ORAL | 5 refills | Status: DC
  Filled 2022-03-23: qty 30, 30d supply, fill #0
  Filled 2022-04-22: qty 30, 30d supply, fill #1

## 2022-03-23 NOTE — Telephone Encounter (Signed)
He was getting OTC, but is less expensive by prescription.

## 2022-03-24 ENCOUNTER — Other Ambulatory Visit (HOSPITAL_COMMUNITY): Payer: 59 | Attending: Psychiatry | Admitting: *Deleted

## 2022-03-24 ENCOUNTER — Other Ambulatory Visit (HOSPITAL_BASED_OUTPATIENT_CLINIC_OR_DEPARTMENT_OTHER): Payer: Self-pay

## 2022-03-24 DIAGNOSIS — F332 Major depressive disorder, recurrent severe without psychotic features: Secondary | ICD-10-CM

## 2022-03-24 NOTE — Progress Notes (Signed)
Patient reported to [facility] for his 33rd session of Repetitive Transcranial Magnetic Stimulation treatment for severe episode of recurrent major depressive disorder, without psychotic features. Patient presented with flat affect. Reports he his having issues again with his sinuses. He had a CT scan with showed occlusions, which may need to be surgically repaired. He also has been having vestibular migraines, causing nausea. He denied any suicidal or homicidal ideations.Patient reported no change in alcohol/substance use, caffeine use. Pt reported no sedation or headache after treatments. Power was titrated to 120%.

## 2022-03-25 ENCOUNTER — Other Ambulatory Visit (HOSPITAL_BASED_OUTPATIENT_CLINIC_OR_DEPARTMENT_OTHER): Payer: Self-pay

## 2022-03-25 ENCOUNTER — Other Ambulatory Visit: Payer: Self-pay | Admitting: Family Medicine

## 2022-03-25 ENCOUNTER — Other Ambulatory Visit: Payer: Self-pay

## 2022-03-25 ENCOUNTER — Other Ambulatory Visit (HOSPITAL_COMMUNITY): Payer: 59 | Attending: Psychiatry | Admitting: *Deleted

## 2022-03-25 DIAGNOSIS — F332 Major depressive disorder, recurrent severe without psychotic features: Secondary | ICD-10-CM | POA: Diagnosis not present

## 2022-03-25 MED ORDER — METHYLFOLATE 400 MCG PO CAPS
1.0000 | ORAL_CAPSULE | Freq: Every day | ORAL | 3 refills | Status: DC
  Filled 2022-03-25: qty 30, 30d supply, fill #0

## 2022-03-25 NOTE — Progress Notes (Signed)
Patient reported to [facility] for his 34th session of Repetitive Transcranial Magnetic Stimulation treatment for severe episode of recurrent major depressive disorder, without psychotic features. Patient presented with flat affect. Reports he his having issues again with his sinuses. He had a CT scan with showed occlusions, which may need to be surgically repaired. He also has been having vestibular migraines, causing nausea. He denied any suicidal or homicidal ideations. 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 Patient reported no change in alcohol/substance use, caffeine use. Pt reported no sedation or headache after treatments. Power was titrated to 120%. PHQ-9 score today is 18.

## 2022-03-25 NOTE — Telephone Encounter (Signed)
Spoke to the patient and he had been getting it at a [facility], his psy never prescribed it only recommended it.  He said for now he will just continue to get OTC at the [facility], he does not want  Folic acid

## 2022-03-28 ENCOUNTER — Other Ambulatory Visit (HOSPITAL_BASED_OUTPATIENT_CLINIC_OR_DEPARTMENT_OTHER): Payer: Self-pay

## 2022-03-28 ENCOUNTER — Other Ambulatory Visit (HOSPITAL_COMMUNITY): Payer: 59 | Attending: Psychiatry | Admitting: *Deleted

## 2022-03-28 DIAGNOSIS — F332 Major depressive disorder, recurrent severe without psychotic features: Secondary | ICD-10-CM

## 2022-03-28 DIAGNOSIS — L738 Other specified follicular disorders: Secondary | ICD-10-CM | POA: Diagnosis not present

## 2022-03-28 NOTE — Progress Notes (Signed)
Patient reported to [facility] for his 35th session of Repetitive Transcranial Magnetic Stimulation treatment for severe episode of recurrent major depressive disorder, without psychotic features. Patient presented with flat affect. Reports he his having issues again with his sinuses. He had a CT scan with showed occlusions, which may need to be surgically repaired. He also has been having vestibular migraines, causing nausea. He denied any suicidal or homicidal ideations. 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 Patient reported no change in alcohol/substance use, caffeine use. Pt reported no sedation or headache after treatments. Power was titrated to 120%.

## 2022-03-29 ENCOUNTER — Other Ambulatory Visit (HOSPITAL_COMMUNITY): Payer: 59 | Attending: Psychiatry | Admitting: *Deleted

## 2022-03-29 DIAGNOSIS — F332 Major depressive disorder, recurrent severe without psychotic features: Secondary | ICD-10-CM

## 2022-03-29 NOTE — Progress Notes (Signed)
Patient reported to [facility] for his 36th session of Repetitive Transcranial Magnetic Stimulation treatment for severe episode of recurrent major depressive disorder, without psychotic features. Patient presented with flat affect, but brightened. Reports he his having issues again with his sinuses. He had a CT scan with showed occlusions, which may need to be surgically repaired. He also has been having vestibular migraines, causing nausea. He denied any suicidal or homicidal ideations. Patient reported no change in alcohol/substance use, caffeine use. Pt reported no sedation or headache after treatments. Power was titrated to 120%. Pt feels like treatments have been beneficial, though he's not feeling quite as well as he'd hoped. He thinks that his sinus and migraine issues have been a barrier and he hopes that once those issues are resolved that he'll feel even better.

## 2022-03-30 ENCOUNTER — Other Ambulatory Visit (HOSPITAL_BASED_OUTPATIENT_CLINIC_OR_DEPARTMENT_OTHER): Payer: Self-pay

## 2022-03-31 ENCOUNTER — Other Ambulatory Visit (HOSPITAL_BASED_OUTPATIENT_CLINIC_OR_DEPARTMENT_OTHER): Payer: Self-pay

## 2022-03-31 MED ORDER — AZITHROMYCIN 250 MG PO TABS
ORAL_TABLET | ORAL | 0 refills | Status: DC
  Filled 2022-03-31: qty 6, 5d supply, fill #0

## 2022-04-04 ENCOUNTER — Other Ambulatory Visit (HOSPITAL_BASED_OUTPATIENT_CLINIC_OR_DEPARTMENT_OTHER): Payer: Self-pay

## 2022-04-04 DIAGNOSIS — F32A Depression, unspecified: Secondary | ICD-10-CM | POA: Diagnosis not present

## 2022-04-04 DIAGNOSIS — F411 Generalized anxiety disorder: Secondary | ICD-10-CM | POA: Diagnosis not present

## 2022-04-04 MED ORDER — CHLORDIAZEPOXIDE HCL 10 MG PO CAPS
10.0000 mg | ORAL_CAPSULE | Freq: Every evening | ORAL | 0 refills | Status: DC | PRN
  Filled 2022-04-04: qty 90, 90d supply, fill #0

## 2022-04-05 ENCOUNTER — Ambulatory Visit: Payer: Self-pay | Admitting: Family Medicine

## 2022-04-05 ENCOUNTER — Ambulatory Visit: Payer: 59 | Admitting: Family Medicine

## 2022-04-05 ENCOUNTER — Other Ambulatory Visit (HOSPITAL_BASED_OUTPATIENT_CLINIC_OR_DEPARTMENT_OTHER): Payer: Self-pay

## 2022-04-06 ENCOUNTER — Other Ambulatory Visit (HOSPITAL_BASED_OUTPATIENT_CLINIC_OR_DEPARTMENT_OTHER): Payer: Self-pay

## 2022-04-07 ENCOUNTER — Telehealth: Payer: Self-pay | Admitting: Family Medicine

## 2022-04-07 ENCOUNTER — Encounter: Payer: Self-pay | Admitting: Family Medicine

## 2022-04-07 NOTE — Telephone Encounter (Signed)
Pt stated he would like a neurology referral. Stated he has discussed this with pcp before.

## 2022-04-08 ENCOUNTER — Other Ambulatory Visit: Payer: Self-pay | Admitting: Family Medicine

## 2022-04-08 DIAGNOSIS — G43809 Other migraine, not intractable, without status migrainosus: Secondary | ICD-10-CM

## 2022-04-08 DIAGNOSIS — J301 Allergic rhinitis due to pollen: Secondary | ICD-10-CM | POA: Diagnosis not present

## 2022-04-08 NOTE — Telephone Encounter (Signed)
OK to refer, dx vestibular migraines. Ty.

## 2022-04-08 NOTE — Telephone Encounter (Signed)
Referral done/patient informed

## 2022-04-11 ENCOUNTER — Ambulatory Visit (INDEPENDENT_AMBULATORY_CARE_PROVIDER_SITE_OTHER): Payer: 59 | Admitting: Family Medicine

## 2022-04-13 DIAGNOSIS — F332 Major depressive disorder, recurrent severe without psychotic features: Secondary | ICD-10-CM | POA: Diagnosis not present

## 2022-04-15 ENCOUNTER — Encounter: Payer: Self-pay | Admitting: Family Medicine

## 2022-04-15 ENCOUNTER — Other Ambulatory Visit: Payer: Self-pay

## 2022-04-15 MED ORDER — PREDNISONE 10 MG PO TABS
ORAL_TABLET | ORAL | 0 refills | Status: DC
  Filled 2022-04-15 – 2022-04-18 (×2): qty 21, 6d supply, fill #0

## 2022-04-15 MED ORDER — AZITHROMYCIN 250 MG PO TABS
ORAL_TABLET | ORAL | 0 refills | Status: AC
  Filled 2022-04-15 – 2022-04-18 (×2): qty 6, 5d supply, fill #0

## 2022-04-18 ENCOUNTER — Other Ambulatory Visit (HOSPITAL_BASED_OUTPATIENT_CLINIC_OR_DEPARTMENT_OTHER): Payer: Self-pay

## 2022-04-18 ENCOUNTER — Other Ambulatory Visit: Payer: Self-pay

## 2022-04-18 ENCOUNTER — Ambulatory Visit (HOSPITAL_BASED_OUTPATIENT_CLINIC_OR_DEPARTMENT_OTHER)
Admission: RE | Admit: 2022-04-18 | Discharge: 2022-04-18 | Disposition: A | Discharge status: Home / Self Care | Payer: 59 | Source: Ambulatory Visit | Attending: Family Medicine | Admitting: Family Medicine

## 2022-04-18 ENCOUNTER — Ambulatory Visit (INDEPENDENT_AMBULATORY_CARE_PROVIDER_SITE_OTHER): Payer: 59 | Admitting: Family Medicine

## 2022-04-18 ENCOUNTER — Encounter: Payer: Self-pay | Admitting: Family Medicine

## 2022-04-18 VITALS — BP 118/70 | Ht 68.0 in | Wt 205.0 lb

## 2022-04-18 DIAGNOSIS — S43432D Superior glenoid labrum lesion of left shoulder, subsequent encounter: Secondary | ICD-10-CM | POA: Insufficient documentation

## 2022-04-18 DIAGNOSIS — M25412 Effusion, left shoulder: Secondary | ICD-10-CM | POA: Insufficient documentation

## 2022-04-18 DIAGNOSIS — M25512 Pain in left shoulder: Secondary | ICD-10-CM | POA: Diagnosis not present

## 2022-04-18 DIAGNOSIS — M778 Other enthesopathies, not elsewhere classified: Secondary | ICD-10-CM | POA: Insufficient documentation

## 2022-04-18 NOTE — Assessment & Plan Note (Signed)
Acute on chronic in nature. Has tightness on exam noticeable on external rotation  - counseled on home exercise therapy and supportive care - xray  - could consider physical therapy or injection

## 2022-04-18 NOTE — Progress Notes (Signed)
  Albert [name] - 26 y.o. male MRN [id number]  Date of birth: Feb 19, 1996  SUBJECTIVE:  Including CC & ROS.  No chief complaint on file.   Albert [name] is a 26 y.o. male that is  presenting with acute on chronic left shoulder pain. Notices the pain with lifting. No prior surgery or injury.    Review of Systems See HPI   HISTORY: Past Medical, Surgical, Social, and Family History Reviewed & Updated per EMR.   Pertinent Historical Findings include:  Past Medical History:  Diagnosis Date   Anxiety with depression 09/30/2016   Bipolar disorder (HCC)    Constipation    History of PSVT (paroxysmal supraventricular tachycardia)    Hyperhidrosis    IBS (irritable bowel syndrome)    Joint pain    Low back pain    Major depression, melancholic type    Schizophrenia ([place])    Thyroid disorder    Vitamin D deficiency     Past Surgical History:  Procedure Laterality Date   HAND RECONSTRUCTION Right 11/2017   WISDOM TOOTH EXTRACTION  11/2015     PHYSICAL EXAM:  VS: BP 118/70   Ht '5\' 8"'$  (1.727 m)   Wt 205 lb (93 kg)   BMI 31.17 kg/m  Physical Exam Gen: NAD, alert, cooperative with exam, well-appearing MSK:  Neurovascularly intact       ASSESSMENT & PLAN:   Capsulitis of left shoulder Acute on chronic in nature. Has tightness on exam noticeable on external rotation  - counseled on home exercise therapy and supportive care - xray  - could consider physical therapy or injection

## 2022-04-18 NOTE — Patient Instructions (Signed)
Good to see you Please try heat before exercises and ice after  Please try the exercises  Please adjusts your lifts and work on your motion   Please send me a message in [place] with any questions or updates.  Please see me back in 6-8 weeks.   --Dr. Raeford Razor

## 2022-04-20 ENCOUNTER — Ambulatory Visit (INDEPENDENT_AMBULATORY_CARE_PROVIDER_SITE_OTHER): Payer: 59 | Admitting: Family Medicine

## 2022-04-20 ENCOUNTER — Encounter (INDEPENDENT_AMBULATORY_CARE_PROVIDER_SITE_OTHER): Payer: Self-pay | Admitting: Family Medicine

## 2022-04-20 VITALS — BP 105/67 | HR 84 | Temp 98.3°F | Ht 68.0 in | Wt 206.0 lb

## 2022-04-20 DIAGNOSIS — Z6830 Body mass index (BMI) 30.0-30.9, adult: Secondary | ICD-10-CM | POA: Diagnosis not present

## 2022-04-20 DIAGNOSIS — E669 Obesity, unspecified: Secondary | ICD-10-CM | POA: Diagnosis not present

## 2022-04-20 DIAGNOSIS — R7401 Elevation of levels of liver transaminase levels: Secondary | ICD-10-CM | POA: Diagnosis not present

## 2022-04-21 ENCOUNTER — Other Ambulatory Visit (HOSPITAL_BASED_OUTPATIENT_CLINIC_OR_DEPARTMENT_OTHER): Payer: Self-pay

## 2022-04-21 DIAGNOSIS — J3089 Other allergic rhinitis: Secondary | ICD-10-CM | POA: Diagnosis not present

## 2022-04-21 MED ORDER — FLUTICASONE PROPIONATE 50 MCG/ACT NA SUSP
NASAL | 3 refills | Status: DC
  Filled 2022-04-21: qty 48, 90d supply, fill #0

## 2022-04-21 MED ORDER — MONTELUKAST SODIUM 10 MG PO TABS
10.0000 mg | ORAL_TABLET | Freq: Every day | ORAL | 3 refills | Status: DC
  Filled 2022-04-21: qty 90, 90d supply, fill #0

## 2022-04-22 ENCOUNTER — Other Ambulatory Visit (HOSPITAL_BASED_OUTPATIENT_CLINIC_OR_DEPARTMENT_OTHER): Payer: Self-pay

## 2022-04-22 ENCOUNTER — Other Ambulatory Visit: Payer: Self-pay | Admitting: Family Medicine

## 2022-04-22 DIAGNOSIS — H6591 Unspecified nonsuppurative otitis media, right ear: Secondary | ICD-10-CM

## 2022-04-22 MED ORDER — LEVOCETIRIZINE DIHYDROCHLORIDE 5 MG PO TABS
5.0000 mg | ORAL_TABLET | Freq: Every evening | ORAL | 2 refills | Status: DC
  Filled 2022-04-22: qty 30, 30d supply, fill #0
  Filled 2022-06-03: qty 30, 30d supply, fill #1

## 2022-04-25 DIAGNOSIS — J3489 Other specified disorders of nose and nasal sinuses: Secondary | ICD-10-CM | POA: Diagnosis not present

## 2022-04-25 DIAGNOSIS — Z79899 Other long term (current) drug therapy: Secondary | ICD-10-CM | POA: Diagnosis not present

## 2022-04-25 DIAGNOSIS — K219 Gastro-esophageal reflux disease without esophagitis: Secondary | ICD-10-CM | POA: Diagnosis not present

## 2022-04-25 DIAGNOSIS — Z01818 Encounter for other preprocedural examination: Secondary | ICD-10-CM | POA: Diagnosis not present

## 2022-04-25 DIAGNOSIS — Z79890 Hormone replacement therapy (postmenopausal): Secondary | ICD-10-CM | POA: Diagnosis not present

## 2022-04-25 DIAGNOSIS — F329 Major depressive disorder, single episode, unspecified: Secondary | ICD-10-CM | POA: Diagnosis not present

## 2022-04-25 DIAGNOSIS — E039 Hypothyroidism, unspecified: Secondary | ICD-10-CM | POA: Diagnosis not present

## 2022-04-25 DIAGNOSIS — F332 Major depressive disorder, recurrent severe without psychotic features: Secondary | ICD-10-CM | POA: Diagnosis not present

## 2022-04-25 DIAGNOSIS — K6282 Dysplasia of anus: Secondary | ICD-10-CM | POA: Diagnosis not present

## 2022-04-25 DIAGNOSIS — J309 Allergic rhinitis, unspecified: Secondary | ICD-10-CM | POA: Diagnosis not present

## 2022-04-25 DIAGNOSIS — F411 Generalized anxiety disorder: Secondary | ICD-10-CM | POA: Diagnosis not present

## 2022-04-26 ENCOUNTER — Other Ambulatory Visit (HOSPITAL_BASED_OUTPATIENT_CLINIC_OR_DEPARTMENT_OTHER): Payer: Self-pay

## 2022-04-26 DIAGNOSIS — G43009 Migraine without aura, not intractable, without status migrainosus: Secondary | ICD-10-CM | POA: Diagnosis not present

## 2022-04-26 MED ORDER — ONDANSETRON 4 MG PO TBDP
4.0000 mg | ORAL_TABLET | Freq: Three times a day (TID) | ORAL | 0 refills | Status: DC | PRN
  Filled 2022-04-26: qty 20, 7d supply, fill #0

## 2022-04-26 MED ORDER — UBRELVY 100 MG PO TABS
100.0000 mg | ORAL_TABLET | Freq: Every day | ORAL | 5 refills | Status: DC | PRN
  Filled 2022-04-26: qty 10, 30d supply, fill #0

## 2022-04-27 ENCOUNTER — Other Ambulatory Visit (HOSPITAL_BASED_OUTPATIENT_CLINIC_OR_DEPARTMENT_OTHER): Payer: Self-pay

## 2022-04-27 DIAGNOSIS — Z6830 Body mass index (BMI) 30.0-30.9, adult: Secondary | ICD-10-CM | POA: Diagnosis not present

## 2022-04-27 DIAGNOSIS — E669 Obesity, unspecified: Secondary | ICD-10-CM | POA: Diagnosis not present

## 2022-04-27 DIAGNOSIS — F332 Major depressive disorder, recurrent severe without psychotic features: Secondary | ICD-10-CM | POA: Diagnosis not present

## 2022-04-28 ENCOUNTER — Other Ambulatory Visit (HOSPITAL_BASED_OUTPATIENT_CLINIC_OR_DEPARTMENT_OTHER): Payer: Self-pay

## 2022-04-29 DIAGNOSIS — F332 Major depressive disorder, recurrent severe without psychotic features: Secondary | ICD-10-CM | POA: Diagnosis not present

## 2022-05-02 ENCOUNTER — Other Ambulatory Visit (HOSPITAL_BASED_OUTPATIENT_CLINIC_OR_DEPARTMENT_OTHER): Payer: Self-pay

## 2022-05-02 DIAGNOSIS — F332 Major depressive disorder, recurrent severe without psychotic features: Secondary | ICD-10-CM | POA: Diagnosis not present

## 2022-05-03 ENCOUNTER — Other Ambulatory Visit (HOSPITAL_BASED_OUTPATIENT_CLINIC_OR_DEPARTMENT_OTHER): Payer: Self-pay

## 2022-05-04 DIAGNOSIS — F332 Major depressive disorder, recurrent severe without psychotic features: Secondary | ICD-10-CM | POA: Diagnosis not present

## 2022-05-04 NOTE — Progress Notes (Signed)
Chief Complaint:   OBESITY Albert [name] is here to discuss his progress with his obesity treatment plan along with follow-up of his obesity related diagnoses. Albert [name] is on keeping a food journal and adhering to recommended goals of 1900-2000 calories and 155+ grams of protein and states he is following his eating plan approximately 0% of the time. Albert [name] states he is going to the gym 90 minutes 4-5 times per week.  Today's visit was #: 68 Starting weight: 236 lbs Starting date: 05/28/2019 Today's weight: 206 lbs Today's date: 04/20/2022 Total lbs lost to date: 30 lbs Total lbs lost since last in-office visit: 1  Interim History: Albert [name] is about to start ECT at [facility]. Stopped taking Wegovy a few weeks ago due to knowing he would not be able to take it while undergoing ECT.  Subjective:   1. Transaminitis Albert [name]'s last ALT of 54, AST of 46, Alk Phos of 53. No history of NAFLD.  Assessment/Plan:   1. Transaminitis Will obtain labs in 3 months.  2. Obesity with current BMI of 30.5 Albert [name] is currently in the action stage of change. As such, his goal is to continue with weight loss efforts. He has agreed to practicing portion control and making smarter food choices, such as increasing vegetables and decreasing simple carbohydrates.   Exercise goals: No exercise has been prescribed at this time.  Behavioral modification strategies: increasing lean protein intake, meal planning and cooking strategies, keeping healthy foods in the home, and planning for success.  Albert [name] has agreed to follow-up with our clinic in as needed. He was informed of the importance of frequent follow-up visits to maximize his success with intensive lifestyle modifications for his multiple health conditions.   Objective:   Blood pressure 105/67, pulse 84, temperature 98.3 F (36.8 C), height _0  (1.727 m), weight 206 lb (93.4 kg), SpO2 99 %. Body mass index is 31.32 kg/m.  General: Cooperative, alert, well  developed, in no acute distress. HEENT: Conjunctivae and lids unremarkable. Cardiovascular: Regular rhythm.  Lungs: Normal work of breathing. Neurologic: No focal deficits.   Lab Results  Component Value Date   CREATININE 1.07 11/11/2021   BUN 15 11/11/2021   NA 140 11/11/2021   K 4.1 11/11/2021   CL 102 11/11/2021   CO2 22 11/11/2021   Lab Results  Component Value Date   ALT 32 11/11/2021   AST 31 11/11/2021   ALKPHOS 68 11/11/2021   BILITOT 0.5 11/11/2021   Lab Results  Component Value Date   HGBA1C 4.7 (L) 11/11/2021   HGBA1C 5.0 02/03/2020   HGBA1C 4.8 05/28/2019   Lab Results  Component Value Date   INSULIN 16.5 11/11/2021   INSULIN 6.2 02/03/2020   INSULIN 19.0 05/28/2019   Lab Results  Component Value Date   TSH 0.346 (L) 02/03/2020   Lab Results  Component Value Date   CHOL 127 11/03/2020   HDL 47.70 11/03/2020   LDLCALC 63 11/03/2020   TRIG 80.0 11/03/2020   CHOLHDL 3 11/03/2020   Lab Results  Component Value Date   VD25OH 54.6 11/11/2021   VD25OH 97.9 03/22/2021   VD25OH 65.6 02/03/2020   Lab Results  Component Value Date   WBC 10.3 04/21/2021   HGB 14.9 04/21/2021   HCT 44.1 04/21/2021   MCV 88.9 04/21/2021   PLT 209 04/21/2021   No results found for: "IRON", "TIBC", "FERRITIN"  Attestation Statements:   Reviewed by clinician on day of visit: allergies, medications, problem list, medical history,  surgical history, family history, social history, and previous encounter notes.  I, Elnora Morrison, RMA am acting as transcriptionist for Coralie Common, MD.  I have reviewed the above documentation for accuracy and completeness, and I agree with the above. - Coralie Common, MD

## 2022-05-05 ENCOUNTER — Telehealth (INDEPENDENT_AMBULATORY_CARE_PROVIDER_SITE_OTHER): Payer: 59 | Admitting: Family Medicine

## 2022-05-05 ENCOUNTER — Other Ambulatory Visit (HOSPITAL_BASED_OUTPATIENT_CLINIC_OR_DEPARTMENT_OTHER): Payer: Self-pay

## 2022-05-05 ENCOUNTER — Encounter (INDEPENDENT_AMBULATORY_CARE_PROVIDER_SITE_OTHER): Payer: Self-pay | Admitting: Family Medicine

## 2022-05-05 DIAGNOSIS — E669 Obesity, unspecified: Secondary | ICD-10-CM

## 2022-05-05 DIAGNOSIS — Z6831 Body mass index (BMI) 31.0-31.9, adult: Secondary | ICD-10-CM

## 2022-05-05 DIAGNOSIS — F339 Major depressive disorder, recurrent, unspecified: Secondary | ICD-10-CM | POA: Diagnosis not present

## 2022-05-05 DIAGNOSIS — R632 Polyphagia: Secondary | ICD-10-CM | POA: Diagnosis not present

## 2022-05-05 NOTE — Progress Notes (Signed)
TeleHealth Visit:  This visit was completed with telemedicine (audio/video) technology. Albert [name] has verbally consented to this TeleHealth visit. The patient is located at home, the provider is located at home. The participants in this visit include the listed provider and patient. The visit was conducted today via MyChart video.  OBESITY Albert [name] is here to discuss his progress with his obesity treatment plan along with follow-up of his obesity related diagnoses.   Today's visit was # 63 Starting weight: 236 lbs Starting date: 05/28/19 Weight at last in office visit: 206 lbs on 04/20/22 Total weight loss: 30 lbs at last in office visit on 04/20/22. Today's reported weight:  No weight reported.  Nutrition Plan: practicing portion control and making smarter food choices, such as increasing vegetables and decreasing simple carbohydrates.   Current exercise:  going to the gym 90 minutes 4-5 times per week.  Interim History:  Albert [name] is undergoing ECT at [facility] 3 times per week.  He has been off of Wegovy for about a month due to these sedation involved with ECT.  He notices increase in hunger and says he is eating more than he was.  He has not weighed recently.  Water intake is adequate.  He says protein intake is "decent".  He continues to exercise consistently 4 to 5 days/week.  Assessment/Plan:  1. Polyphagia Albert [name] endorses excessive hunger.  Off of Wegovy 1.7 mg for about a month.  Plans on resuming after ECT complete and has 2 injections left.  Plan: Resume Wegovy 1.7 mg when allowed by psychiatry.   2.  Recurrent major depressive disorder Completed series of TMS treatments without remission of depression.  Started ECT at [facility] 3 times per week last week (04/26/22).  He just had treatment #5.  Already notices improvement in depression.  Says that cognitive impairment is worse than he expected.  He does not remember making our appointment for today. Physically he feels fine.  He  is off of Librium during the treatment.  Not seeing counselor during treatment.  Plan: Continue to follow treatment plan as set forth by psychiatry at [facility].  3. Obesity: Current BMI 31.4 Albert [name] is not currently in the action stage of change. As such, his goal is to maintain weight for now.  He has agreed to practicing portion control and making smarter food choices, such as increasing vegetables and decreasing simple carbohydrates.   Exercise goals:  as is  Behavioral modification strategies: increasing lean protein intake and decreasing simple carbohydrates.  Albert [name] has agreed to follow-up in office in 4 weeks.   No orders of the defined types were placed in this encounter.   There are no discontinued medications.   No orders of the defined types were placed in this encounter.     Objective:   VITALS: Per patient if applicable, see vitals. GENERAL: Alert and in no acute distress. CARDIOPULMONARY: No increased WOB. Speaking in clear sentences.  PSYCH: Pleasant and cooperative. Speech normal rate and rhythm. Affect is appropriate. Insight and judgement are appropriate. Attention is focused, linear, and appropriate.  NEURO: Oriented as arrived to appointment on time with no prompting.   Lab Results  Component Value Date   CREATININE 1.07 11/11/2021   BUN 15 11/11/2021   NA 140 11/11/2021   K 4.1 11/11/2021   CL 102 11/11/2021   CO2 22 11/11/2021   Lab Results  Component Value Date   ALT 32 11/11/2021   AST 31 11/11/2021   ALKPHOS 68 11/11/2021   BILITOT 0.5  11/11/2021   Lab Results  Component Value Date   HGBA1C 4.7 (L) 11/11/2021   HGBA1C 5.0 02/03/2020   HGBA1C 4.8 05/28/2019   Lab Results  Component Value Date   INSULIN 16.5 11/11/2021   INSULIN 6.2 02/03/2020   INSULIN 19.0 05/28/2019   Lab Results  Component Value Date   TSH 0.346 (L) 02/03/2020   Lab Results  Component Value Date   CHOL 127 11/03/2020   HDL 47.70 11/03/2020   LDLCALC 63  11/03/2020   TRIG 80.0 11/03/2020   CHOLHDL 3 11/03/2020   Lab Results  Component Value Date   WBC 10.3 04/21/2021   HGB 14.9 04/21/2021   HCT 44.1 04/21/2021   MCV 88.9 04/21/2021   PLT 209 04/21/2021   No results found for: "IRON", "TIBC", "FERRITIN" Lab Results  Component Value Date   VD25OH 54.6 11/11/2021   VD25OH 97.9 03/22/2021   VD25OH 65.6 02/03/2020    Attestation Statements:   Reviewed by clinician on day of visit: allergies, medications, problem list, medical history, surgical history, family history, social history, and previous encounter notes.

## 2022-05-06 DIAGNOSIS — F332 Major depressive disorder, recurrent severe without psychotic features: Secondary | ICD-10-CM | POA: Diagnosis not present

## 2022-05-09 ENCOUNTER — Telehealth (INDEPENDENT_AMBULATORY_CARE_PROVIDER_SITE_OTHER): Payer: 59 | Admitting: Family Medicine

## 2022-05-09 ENCOUNTER — Other Ambulatory Visit (HOSPITAL_BASED_OUTPATIENT_CLINIC_OR_DEPARTMENT_OTHER): Payer: Self-pay

## 2022-05-10 ENCOUNTER — Other Ambulatory Visit (HOSPITAL_BASED_OUTPATIENT_CLINIC_OR_DEPARTMENT_OTHER): Payer: Self-pay

## 2022-05-11 ENCOUNTER — Other Ambulatory Visit (HOSPITAL_BASED_OUTPATIENT_CLINIC_OR_DEPARTMENT_OTHER): Payer: Self-pay

## 2022-05-11 DIAGNOSIS — F332 Major depressive disorder, recurrent severe without psychotic features: Secondary | ICD-10-CM | POA: Diagnosis not present

## 2022-05-17 ENCOUNTER — Other Ambulatory Visit (HOSPITAL_BASED_OUTPATIENT_CLINIC_OR_DEPARTMENT_OTHER): Payer: Self-pay

## 2022-05-17 DIAGNOSIS — F332 Major depressive disorder, recurrent severe without psychotic features: Secondary | ICD-10-CM | POA: Diagnosis not present

## 2022-05-18 ENCOUNTER — Other Ambulatory Visit (HOSPITAL_BASED_OUTPATIENT_CLINIC_OR_DEPARTMENT_OTHER): Payer: Self-pay

## 2022-05-18 DIAGNOSIS — R569 Unspecified convulsions: Secondary | ICD-10-CM | POA: Diagnosis not present

## 2022-05-18 DIAGNOSIS — F332 Major depressive disorder, recurrent severe without psychotic features: Secondary | ICD-10-CM | POA: Diagnosis not present

## 2022-05-19 ENCOUNTER — Other Ambulatory Visit (HOSPITAL_BASED_OUTPATIENT_CLINIC_OR_DEPARTMENT_OTHER): Payer: Self-pay

## 2022-05-21 DIAGNOSIS — F329 Major depressive disorder, single episode, unspecified: Secondary | ICD-10-CM | POA: Diagnosis not present

## 2022-05-21 DIAGNOSIS — F419 Anxiety disorder, unspecified: Secondary | ICD-10-CM | POA: Diagnosis not present

## 2022-05-21 DIAGNOSIS — F39 Unspecified mood [affective] disorder: Secondary | ICD-10-CM | POA: Diagnosis not present

## 2022-05-21 DIAGNOSIS — R45851 Suicidal ideations: Secondary | ICD-10-CM | POA: Diagnosis not present

## 2022-05-21 DIAGNOSIS — G4700 Insomnia, unspecified: Secondary | ICD-10-CM | POA: Diagnosis not present

## 2022-05-21 DIAGNOSIS — K219 Gastro-esophageal reflux disease without esophagitis: Secondary | ICD-10-CM | POA: Diagnosis not present

## 2022-05-21 DIAGNOSIS — K5900 Constipation, unspecified: Secondary | ICD-10-CM | POA: Diagnosis not present

## 2022-05-21 DIAGNOSIS — F332 Major depressive disorder, recurrent severe without psychotic features: Secondary | ICD-10-CM | POA: Diagnosis not present

## 2022-05-21 DIAGNOSIS — E039 Hypothyroidism, unspecified: Secondary | ICD-10-CM | POA: Diagnosis not present

## 2022-05-21 DIAGNOSIS — J309 Allergic rhinitis, unspecified: Secondary | ICD-10-CM | POA: Diagnosis not present

## 2022-05-21 DIAGNOSIS — G2581 Restless legs syndrome: Secondary | ICD-10-CM | POA: Diagnosis not present

## 2022-05-21 DIAGNOSIS — Z882 Allergy status to sulfonamides status: Secondary | ICD-10-CM | POA: Diagnosis not present

## 2022-05-22 DIAGNOSIS — Z882 Allergy status to sulfonamides status: Secondary | ICD-10-CM | POA: Diagnosis not present

## 2022-05-22 DIAGNOSIS — G4700 Insomnia, unspecified: Secondary | ICD-10-CM | POA: Diagnosis not present

## 2022-05-22 DIAGNOSIS — F339 Major depressive disorder, recurrent, unspecified: Secondary | ICD-10-CM | POA: Diagnosis not present

## 2022-05-22 DIAGNOSIS — G2581 Restless legs syndrome: Secondary | ICD-10-CM | POA: Diagnosis not present

## 2022-05-22 DIAGNOSIS — F419 Anxiety disorder, unspecified: Secondary | ICD-10-CM | POA: Diagnosis not present

## 2022-05-22 DIAGNOSIS — K5900 Constipation, unspecified: Secondary | ICD-10-CM | POA: Diagnosis not present

## 2022-05-22 DIAGNOSIS — F332 Major depressive disorder, recurrent severe without psychotic features: Secondary | ICD-10-CM | POA: Diagnosis not present

## 2022-05-22 DIAGNOSIS — K219 Gastro-esophageal reflux disease without esophagitis: Secondary | ICD-10-CM | POA: Diagnosis not present

## 2022-05-22 DIAGNOSIS — F39 Unspecified mood [affective] disorder: Secondary | ICD-10-CM | POA: Diagnosis not present

## 2022-05-22 DIAGNOSIS — J309 Allergic rhinitis, unspecified: Secondary | ICD-10-CM | POA: Diagnosis not present

## 2022-05-22 DIAGNOSIS — E039 Hypothyroidism, unspecified: Secondary | ICD-10-CM | POA: Diagnosis not present

## 2022-05-23 DIAGNOSIS — F332 Major depressive disorder, recurrent severe without psychotic features: Secondary | ICD-10-CM | POA: Diagnosis not present

## 2022-05-23 DIAGNOSIS — K5900 Constipation, unspecified: Secondary | ICD-10-CM | POA: Diagnosis not present

## 2022-05-23 DIAGNOSIS — Z882 Allergy status to sulfonamides status: Secondary | ICD-10-CM | POA: Diagnosis not present

## 2022-05-23 DIAGNOSIS — F419 Anxiety disorder, unspecified: Secondary | ICD-10-CM | POA: Diagnosis not present

## 2022-05-23 DIAGNOSIS — G2581 Restless legs syndrome: Secondary | ICD-10-CM | POA: Diagnosis not present

## 2022-05-23 DIAGNOSIS — F339 Major depressive disorder, recurrent, unspecified: Secondary | ICD-10-CM | POA: Diagnosis not present

## 2022-05-23 DIAGNOSIS — E039 Hypothyroidism, unspecified: Secondary | ICD-10-CM | POA: Diagnosis not present

## 2022-05-23 DIAGNOSIS — G4700 Insomnia, unspecified: Secondary | ICD-10-CM | POA: Diagnosis not present

## 2022-05-23 DIAGNOSIS — K219 Gastro-esophageal reflux disease without esophagitis: Secondary | ICD-10-CM | POA: Diagnosis not present

## 2022-05-23 DIAGNOSIS — J309 Allergic rhinitis, unspecified: Secondary | ICD-10-CM | POA: Diagnosis not present

## 2022-05-24 ENCOUNTER — Other Ambulatory Visit (HOSPITAL_BASED_OUTPATIENT_CLINIC_OR_DEPARTMENT_OTHER): Payer: Self-pay

## 2022-05-25 ENCOUNTER — Other Ambulatory Visit (HOSPITAL_BASED_OUTPATIENT_CLINIC_OR_DEPARTMENT_OTHER): Payer: Self-pay

## 2022-05-25 DIAGNOSIS — F332 Major depressive disorder, recurrent severe without psychotic features: Secondary | ICD-10-CM | POA: Diagnosis not present

## 2022-05-26 ENCOUNTER — Other Ambulatory Visit (HOSPITAL_BASED_OUTPATIENT_CLINIC_OR_DEPARTMENT_OTHER): Payer: Self-pay

## 2022-05-27 ENCOUNTER — Other Ambulatory Visit (HOSPITAL_BASED_OUTPATIENT_CLINIC_OR_DEPARTMENT_OTHER): Payer: Self-pay

## 2022-05-27 MED ORDER — LITHIUM CARBONATE ER 300 MG PO TBCR: EXTENDED_RELEASE_TABLET | ORAL | 0 refills | Status: DC

## 2022-05-27 MED ORDER — LITHIUM CARBONATE ER 300 MG PO TBCR
EXTENDED_RELEASE_TABLET | ORAL | 0 refills | Status: DC
  Filled 2022-05-27: qty 90, 30d supply, fill #0

## 2022-05-30 ENCOUNTER — Other Ambulatory Visit (HOSPITAL_BASED_OUTPATIENT_CLINIC_OR_DEPARTMENT_OTHER): Payer: Self-pay

## 2022-05-31 ENCOUNTER — Other Ambulatory Visit (HOSPITAL_BASED_OUTPATIENT_CLINIC_OR_DEPARTMENT_OTHER): Payer: Self-pay

## 2022-06-01 ENCOUNTER — Other Ambulatory Visit (HOSPITAL_BASED_OUTPATIENT_CLINIC_OR_DEPARTMENT_OTHER): Payer: Self-pay

## 2022-06-01 DIAGNOSIS — F332 Major depressive disorder, recurrent severe without psychotic features: Secondary | ICD-10-CM | POA: Diagnosis not present

## 2022-06-02 ENCOUNTER — Ambulatory Visit (INDEPENDENT_AMBULATORY_CARE_PROVIDER_SITE_OTHER): Payer: Commercial Managed Care - PPO | Admitting: Family Medicine

## 2022-06-02 ENCOUNTER — Other Ambulatory Visit (HOSPITAL_BASED_OUTPATIENT_CLINIC_OR_DEPARTMENT_OTHER): Payer: Self-pay

## 2022-06-02 ENCOUNTER — Encounter (INDEPENDENT_AMBULATORY_CARE_PROVIDER_SITE_OTHER): Payer: Self-pay | Admitting: Family Medicine

## 2022-06-02 VITALS — BP 119/79 | HR 99 | Temp 98.5°F | Ht 68.0 in | Wt 199.0 lb

## 2022-06-02 DIAGNOSIS — F332 Major depressive disorder, recurrent severe without psychotic features: Secondary | ICD-10-CM | POA: Diagnosis not present

## 2022-06-02 DIAGNOSIS — Z6830 Body mass index (BMI) 30.0-30.9, adult: Secondary | ICD-10-CM

## 2022-06-02 DIAGNOSIS — E88819 Insulin resistance, unspecified: Secondary | ICD-10-CM

## 2022-06-02 DIAGNOSIS — R55 Syncope and collapse: Secondary | ICD-10-CM | POA: Diagnosis not present

## 2022-06-02 DIAGNOSIS — E669 Obesity, unspecified: Secondary | ICD-10-CM

## 2022-06-02 DIAGNOSIS — G43919 Migraine, unspecified, intractable, without status migrainosus: Secondary | ICD-10-CM | POA: Diagnosis not present

## 2022-06-10 DIAGNOSIS — F332 Major depressive disorder, recurrent severe without psychotic features: Secondary | ICD-10-CM | POA: Diagnosis not present

## 2022-06-10 DIAGNOSIS — G43919 Migraine, unspecified, intractable, without status migrainosus: Secondary | ICD-10-CM | POA: Diagnosis not present

## 2022-06-10 DIAGNOSIS — R55 Syncope and collapse: Secondary | ICD-10-CM | POA: Diagnosis not present

## 2022-06-10 DIAGNOSIS — F339 Major depressive disorder, recurrent, unspecified: Secondary | ICD-10-CM | POA: Diagnosis not present

## 2022-06-13 NOTE — Progress Notes (Signed)
Chief Complaint:   OBESITY Albert [name] is here to discuss his progress with his obesity treatment plan along with follow-up of his obesity related diagnoses. Albert [name] is on practicing portion control and making smarter food choices, such as increasing vegetables and decreasing simple carbohydrates and states he is following his eating plan approximately 75% of the time. Albert [name] states he is working out 90 minutes 5 times per week.  Today's visit was #: 28 Starting weight: 236 lbs Starting date: 05/28/2019 Today's weight: 199 lbs Today's date: 06/02/2022 Total lbs lost to date: 37 lbs Total lbs lost since last in-office visit: 7  Interim History: Albert [name] returns to clinic after undergoing ECT (Electroconvulsive therapy).  Possible last treatment will be in 2 weeks.  Overall he feels good-feels grateful and thankful ECT worked.  Subjective:   1. Insulin resistance Previously on GLP-1.  Denies GI side effects.  Off medication for 2 months.  Assessment/Plan:   1. Insulin resistance Labs with PCP.  2. Obesity with current BMI of 30.3 Albert [name] is currently in the action stage of change. As such, his goal is to continue with weight loss efforts. He has agreed to keeping a food journal and adhering to recommended goals of 2000-2200 calories and 130+ grams of protein daily.   Exercise goals: As is.  Ultimate gaol 150-300 minutes of resistance training weekly.  Behavioral modification strategies: increasing lean protein intake, meal planning and cooking strategies, keeping healthy foods in the home, planning for success, and keeping a strict food journal.  Albert [name] has agreed to follow-up with our clinic in as needed. He was informed of the importance of frequent follow-up visits to maximize his success with intensive lifestyle modifications for his multiple health conditions.   Objective:   Blood pressure 119/79, pulse 99, temperature 98.5 F (36.9 C), height '5\' 8"'$  (1.727 m), weight 199 lb (90.3  kg), SpO2 99 %. Body mass index is 30.26 kg/m.  General: Cooperative, alert, well developed, in no acute distress. HEENT: Conjunctivae and lids unremarkable. Cardiovascular: Regular rhythm.  Lungs: Normal work of breathing. Neurologic: No focal deficits.   Lab Results  Component Value Date   CREATININE 1.07 11/11/2021   BUN 15 11/11/2021   NA 140 11/11/2021   K 4.1 11/11/2021   CL 102 11/11/2021   CO2 22 11/11/2021   Lab Results  Component Value Date   ALT 32 11/11/2021   AST 31 11/11/2021   ALKPHOS 68 11/11/2021   BILITOT 0.5 11/11/2021   Lab Results  Component Value Date   HGBA1C 4.7 (L) 11/11/2021   HGBA1C 5.0 02/03/2020   HGBA1C 4.8 05/28/2019   Lab Results  Component Value Date   INSULIN 16.5 11/11/2021   INSULIN 6.2 02/03/2020   INSULIN 19.0 05/28/2019   Lab Results  Component Value Date   TSH 0.346 (L) 02/03/2020   Lab Results  Component Value Date   CHOL 127 11/03/2020   HDL 47.70 11/03/2020   LDLCALC 63 11/03/2020   TRIG 80.0 11/03/2020   CHOLHDL 3 11/03/2020   Lab Results  Component Value Date   VD25OH 54.6 11/11/2021   VD25OH 97.9 03/22/2021   VD25OH 65.6 02/03/2020   Lab Results  Component Value Date   WBC 10.3 04/21/2021   HGB 14.9 04/21/2021   HCT 44.1 04/21/2021   MCV 88.9 04/21/2021   PLT 209 04/21/2021   No results found for: "IRON", "TIBC", "FERRITIN"  Attestation Statements:   Reviewed by clinician on day of visit: allergies, medications, problem  list, medical history, surgical history, family history, social history, and previous encounter notes.  I, Elnora Morrison, RMA am acting as transcriptionist for Coralie Common, MD.  I have reviewed the above documentation for accuracy and completeness, and I agree with the above. - Coralie Common, MD

## 2022-06-15 DIAGNOSIS — F339 Major depressive disorder, recurrent, unspecified: Secondary | ICD-10-CM | POA: Diagnosis not present

## 2022-06-17 ENCOUNTER — Ambulatory Visit: Payer: Self-pay | Admitting: Family Medicine

## 2022-06-17 DIAGNOSIS — R55 Syncope and collapse: Secondary | ICD-10-CM | POA: Diagnosis not present

## 2022-06-17 DIAGNOSIS — F332 Major depressive disorder, recurrent severe without psychotic features: Secondary | ICD-10-CM | POA: Diagnosis not present

## 2022-06-17 DIAGNOSIS — G43919 Migraine, unspecified, intractable, without status migrainosus: Secondary | ICD-10-CM | POA: Diagnosis not present

## 2022-06-21 ENCOUNTER — Encounter: Payer: Self-pay | Admitting: Family Medicine

## 2022-06-21 ENCOUNTER — Ambulatory Visit: Payer: Commercial Managed Care - PPO | Admitting: Family Medicine

## 2022-06-21 ENCOUNTER — Ambulatory Visit: Payer: Self-pay

## 2022-06-21 VITALS — BP 122/86 | Ht 69.0 in | Wt 200.0 lb

## 2022-06-21 DIAGNOSIS — M25412 Effusion, left shoulder: Secondary | ICD-10-CM

## 2022-06-21 DIAGNOSIS — M778 Other enthesopathies, not elsewhere classified: Secondary | ICD-10-CM

## 2022-06-21 NOTE — Assessment & Plan Note (Signed)
Acute on chronic in nature.  Symptoms are more on the proximal portion of the shoulder with changes appreciated with the [facility] joint. -Counseled on home exercise therapy and supportive care. -Counseled on Voltaren gel. -Referral to physical therapy. -Could consider injection or PRP.

## 2022-06-21 NOTE — Patient Instructions (Signed)
Good to see you Please try ice on the area  Please try voltaren over the counter gel  We have made a referral to physical therapy  Please send me a message in MyChart with any questions or updates.  Please see me back in 4-6 weeks.   --Dr. Raeford Razor

## 2022-06-21 NOTE — Progress Notes (Signed)
  Albert [name] - 27 y.o. male MRN [id number]  Date of birth: 05-Oct-1995  SUBJECTIVE:  Including CC & ROS.  No chief complaint on file.   Albert [name] is a 27 y.o. male that is following up for his left shoulder pain.  Continues to have pain.  No improvement with modifications in place.  Pain is more on the superior portion of the shoulder and worse with pushing.    Review of Systems See HPI   HISTORY: Past Medical, Surgical, Social, and Family History Reviewed & Updated per EMR.   Pertinent Historical Findings include:  Past Medical History:  Diagnosis Date   Anxiety with depression 09/30/2016   Bipolar disorder (HCC)    Constipation    History of PSVT (paroxysmal supraventricular tachycardia)    Hyperhidrosis    IBS (irritable bowel syndrome)    Joint pain    Low back pain    Major depression, melancholic type    Schizophrenia ([place])    Thyroid disorder    Vitamin D deficiency     Past Surgical History:  Procedure Laterality Date   HAND RECONSTRUCTION Right 11/2017   WISDOM TOOTH EXTRACTION  11/2015     PHYSICAL EXAM:  VS: BP 122/86   Ht '5\' 9"'$  (1.753 m)   Wt 200 lb (90.7 kg)   BMI 29.53 kg/m  Physical Exam Gen: NAD, alert, cooperative with exam, well-appearing MSK:  Neurovascularly intact    Limited ultrasound: Left shoulder pain:  No effusion in the bicipital tendon sheath. Normal-appearing subscapularis. Moderate subacromial bursitis. Hyperlucency appreciated within the [facility] joint and effusion appreciated. The right AC joint has a mild effusion but no hyperlucency  Summary: Findings consistent with an AC joint effusion.  Ultrasound and interpretation by Clearance Coots, MD    ASSESSMENT & PLAN:   Effusion of acromioclavicular joint, left Acute on chronic in nature.  Symptoms are more on the proximal portion of the shoulder with changes appreciated with the [facility] joint. -Counseled on home exercise therapy and supportive care. -Counseled on Voltaren  gel. -Referral to physical therapy. -Could consider injection or PRP.

## 2022-06-23 DIAGNOSIS — M25412 Effusion, left shoulder: Secondary | ICD-10-CM | POA: Diagnosis not present

## 2022-06-23 DIAGNOSIS — S4352XD Sprain of left acromioclavicular joint, subsequent encounter: Secondary | ICD-10-CM | POA: Diagnosis not present

## 2022-06-23 DIAGNOSIS — M25512 Pain in left shoulder: Secondary | ICD-10-CM | POA: Diagnosis not present

## 2022-06-23 IMAGING — US US THYROID
1 series · 14 of 25 positions shown · non-contrast
Comparison: None.

CLINICAL DATA: Thyromegaly

EXAM:
THYROID ULTRASOUND
TECHNIQUE: Ultrasound examination of the thyroid gland and adjacent soft
tissues was performed.

[Series 1: us thyroid · 14 of 25 slices shown]
[im 1/25]
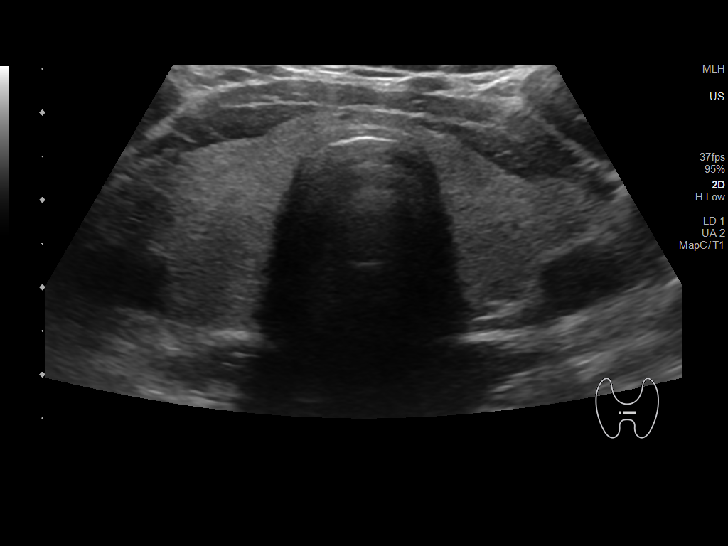
[im 3/25]
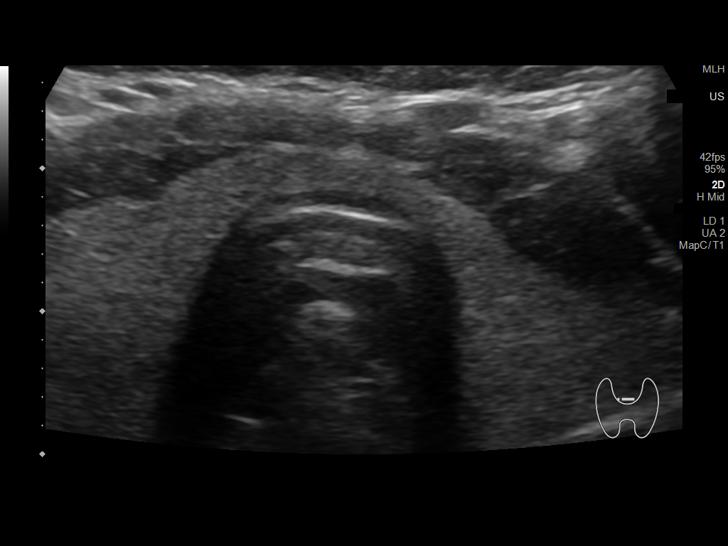
[im 5/25]
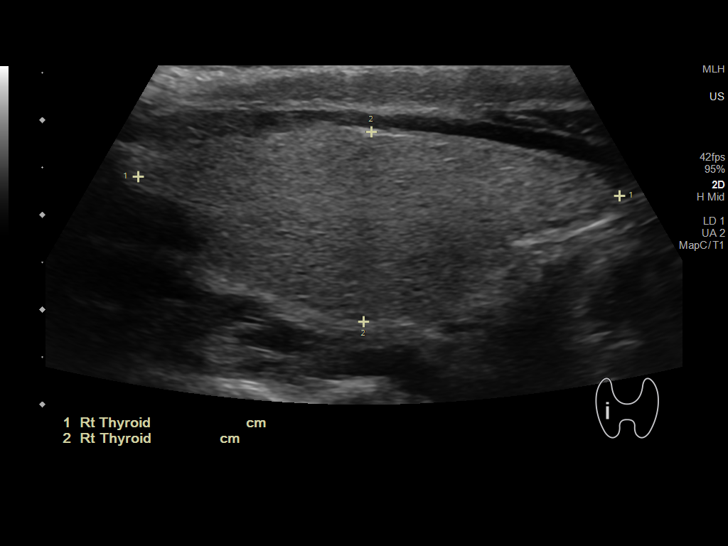
[im 7/25]
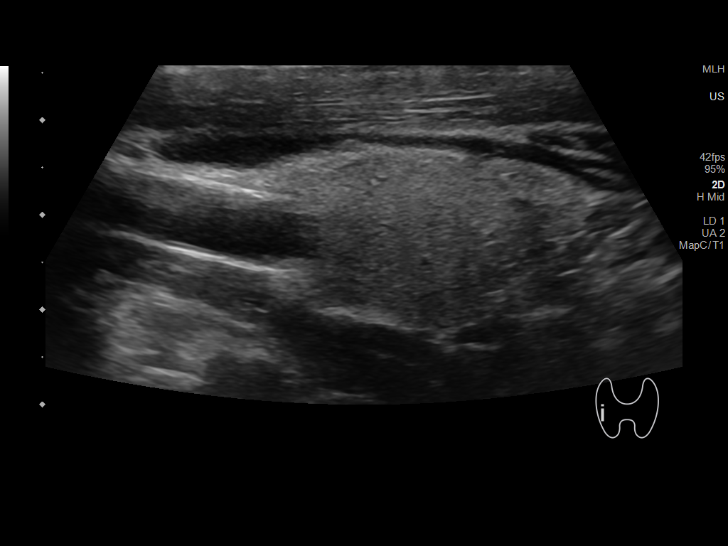
[im 9/25]
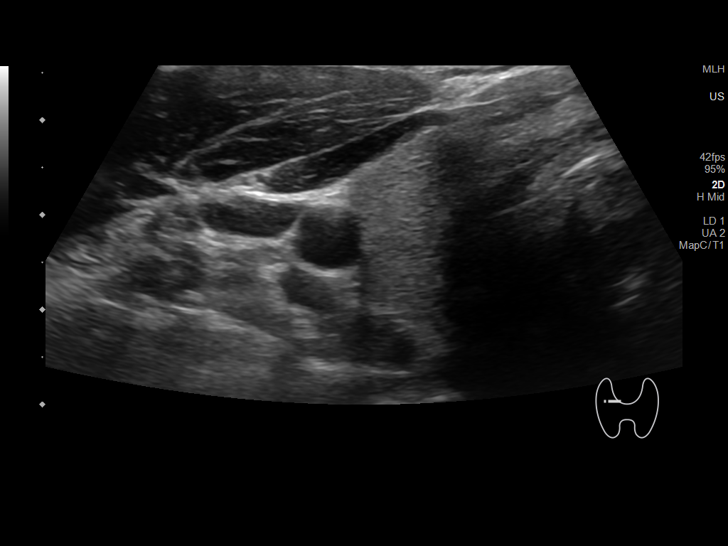
[im 10/25]
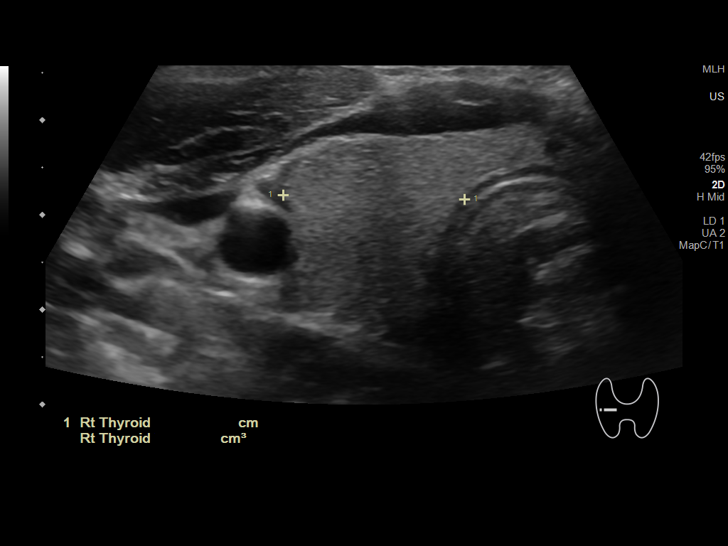
[im 12/25]
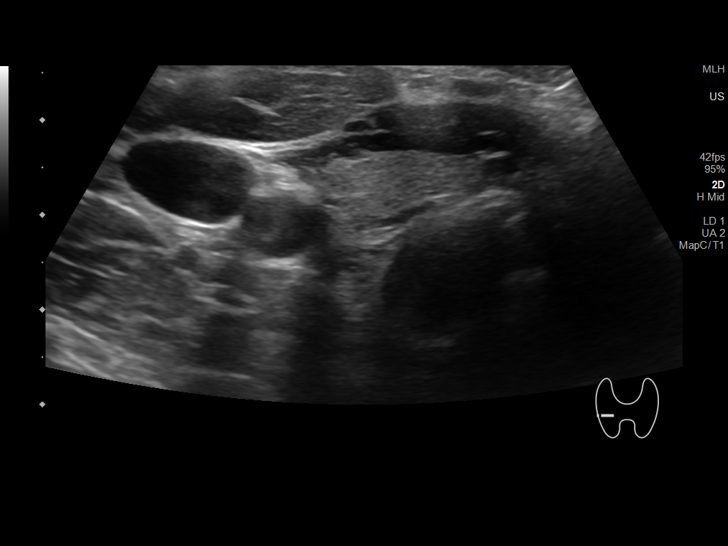
[im 14/25]
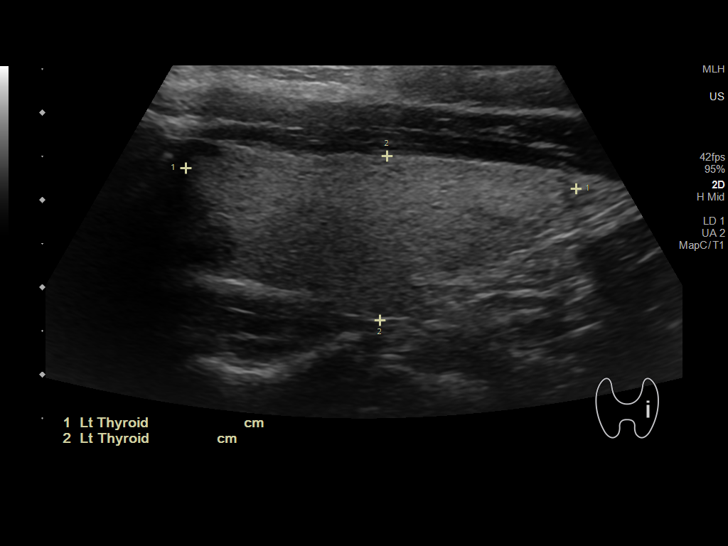
[im 16/25]
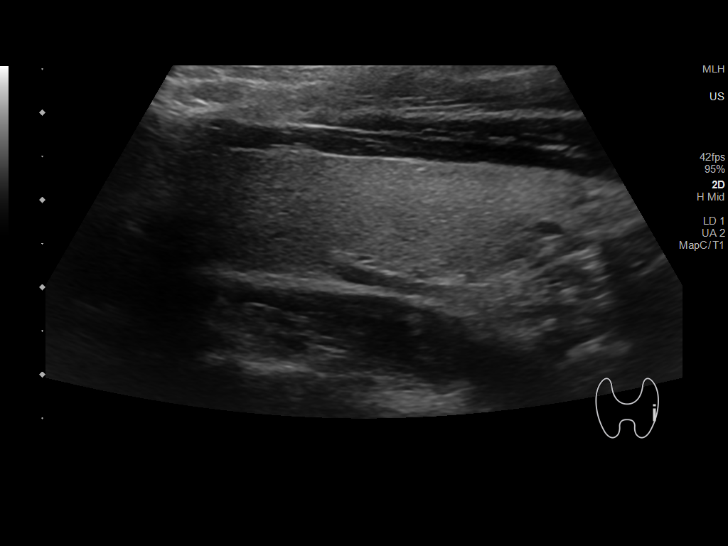
[im 17/25]
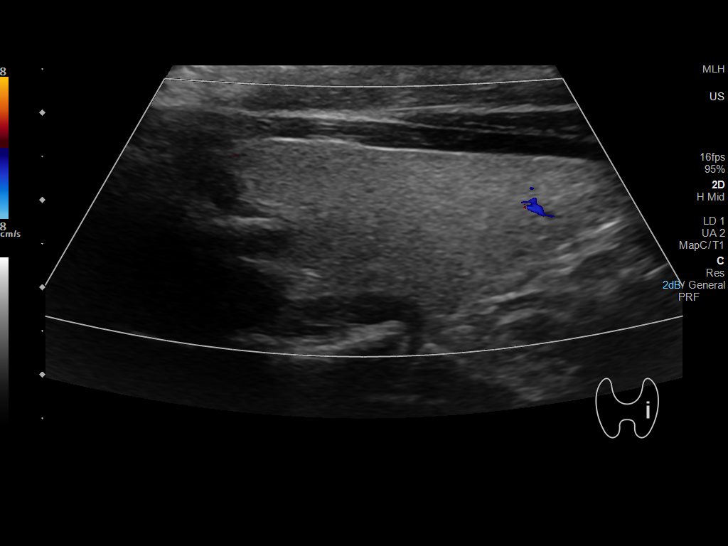
[im 19/25]
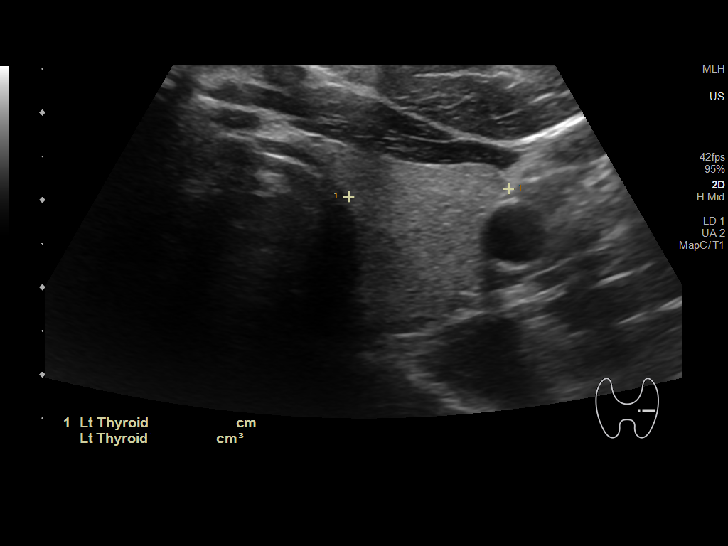
[im 21/25]
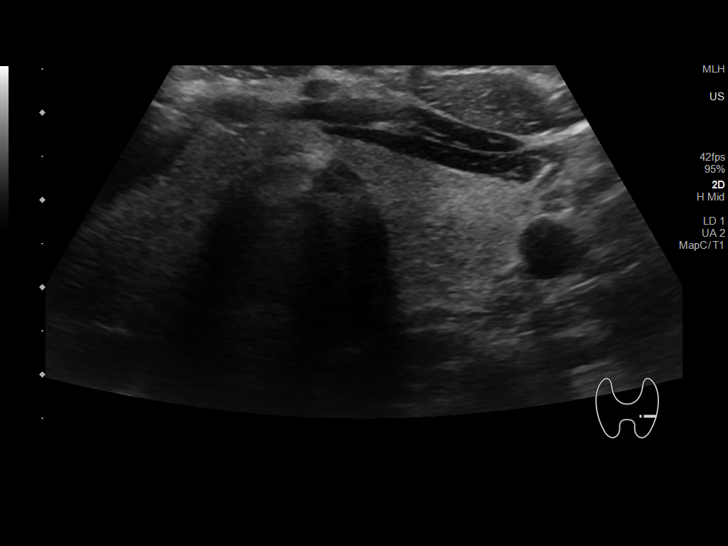
[im 23/25]
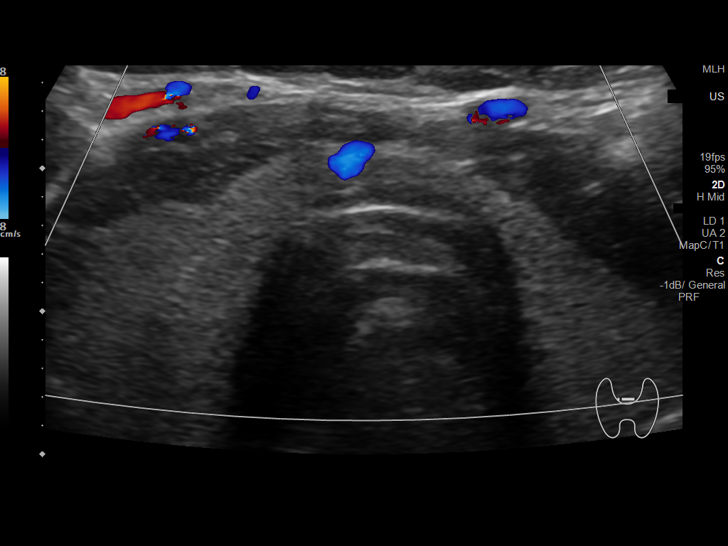
[im 25/25]
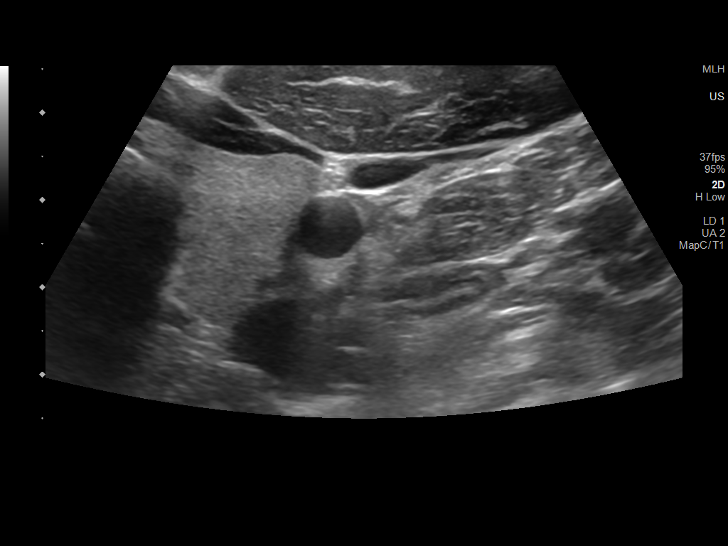

[14 of 25 positions shown; findings below may reference images not displayed]

FINDINGS: Parenchymal Echotexture: Normal

Isthmus: 0.3 cm

Right lobe: 5.1 x 2.0 x 1.9 cm

Left lobe: 4.5 x 1.9 x 1.8 cm

_________________________________________________________

Estimated total number of nodules >/= 1 cm: 0

Number of spongiform nodules >/=  2 cm not described below (TR1): 0

Number of mixed cystic and solid nodules >/= 1.5 cm not described
below (TR2): 0

_________________________________________________________

No discrete nodules are seen within the thyroid gland.
IMPRESSION: Normal thyroid ultrasound

The above is in keeping with the ACR TI-RADS recommendations - [HOSPITAL] 8882;[DATE].

## 2022-06-24 ENCOUNTER — Other Ambulatory Visit (HOSPITAL_BASED_OUTPATIENT_CLINIC_OR_DEPARTMENT_OTHER): Payer: Self-pay

## 2022-06-24 DIAGNOSIS — G43919 Migraine, unspecified, intractable, without status migrainosus: Secondary | ICD-10-CM | POA: Diagnosis not present

## 2022-06-24 DIAGNOSIS — F332 Major depressive disorder, recurrent severe without psychotic features: Secondary | ICD-10-CM | POA: Diagnosis not present

## 2022-06-24 DIAGNOSIS — R55 Syncope and collapse: Secondary | ICD-10-CM | POA: Diagnosis not present

## 2022-06-27 DIAGNOSIS — M25512 Pain in left shoulder: Secondary | ICD-10-CM | POA: Diagnosis not present

## 2022-06-27 DIAGNOSIS — S4352XD Sprain of left acromioclavicular joint, subsequent encounter: Secondary | ICD-10-CM | POA: Diagnosis not present

## 2022-06-27 DIAGNOSIS — M25412 Effusion, left shoulder: Secondary | ICD-10-CM | POA: Diagnosis not present

## 2022-06-28 DIAGNOSIS — F339 Major depressive disorder, recurrent, unspecified: Secondary | ICD-10-CM | POA: Diagnosis not present

## 2022-06-28 DIAGNOSIS — R110 Nausea: Secondary | ICD-10-CM | POA: Diagnosis not present

## 2022-06-28 DIAGNOSIS — Z79899 Other long term (current) drug therapy: Secondary | ICD-10-CM | POA: Diagnosis not present

## 2022-06-28 DIAGNOSIS — F332 Major depressive disorder, recurrent severe without psychotic features: Secondary | ICD-10-CM | POA: Diagnosis not present

## 2022-06-30 ENCOUNTER — Other Ambulatory Visit (HOSPITAL_BASED_OUTPATIENT_CLINIC_OR_DEPARTMENT_OTHER): Payer: Self-pay

## 2022-07-01 DIAGNOSIS — F3342 Major depressive disorder, recurrent, in full remission: Secondary | ICD-10-CM | POA: Diagnosis not present

## 2022-07-04 DIAGNOSIS — J301 Allergic rhinitis due to pollen: Secondary | ICD-10-CM | POA: Diagnosis not present

## 2022-07-04 DIAGNOSIS — J3089 Other allergic rhinitis: Secondary | ICD-10-CM | POA: Diagnosis not present

## 2022-07-05 ENCOUNTER — Other Ambulatory Visit (HOSPITAL_BASED_OUTPATIENT_CLINIC_OR_DEPARTMENT_OTHER): Payer: Self-pay

## 2022-07-05 DIAGNOSIS — S4352XD Sprain of left acromioclavicular joint, subsequent encounter: Secondary | ICD-10-CM | POA: Diagnosis not present

## 2022-07-05 DIAGNOSIS — M25412 Effusion, left shoulder: Secondary | ICD-10-CM | POA: Diagnosis not present

## 2022-07-05 DIAGNOSIS — M25512 Pain in left shoulder: Secondary | ICD-10-CM | POA: Diagnosis not present

## 2022-07-05 MED ORDER — EPINEPHRINE 0.3 MG/0.3ML IJ SOAJ
INTRAMUSCULAR | 2 refills | Status: AC
  Filled 2022-07-05: qty 2, 1d supply, fill #0

## 2022-07-07 DIAGNOSIS — J301 Allergic rhinitis due to pollen: Secondary | ICD-10-CM | POA: Diagnosis not present

## 2022-07-08 DIAGNOSIS — F3342 Major depressive disorder, recurrent, in full remission: Secondary | ICD-10-CM | POA: Diagnosis not present

## 2022-07-08 DIAGNOSIS — F3341 Major depressive disorder, recurrent, in partial remission: Secondary | ICD-10-CM | POA: Diagnosis not present

## 2022-07-12 DIAGNOSIS — M25412 Effusion, left shoulder: Secondary | ICD-10-CM | POA: Diagnosis not present

## 2022-07-12 DIAGNOSIS — M25512 Pain in left shoulder: Secondary | ICD-10-CM | POA: Diagnosis not present

## 2022-07-12 DIAGNOSIS — S4352XD Sprain of left acromioclavicular joint, subsequent encounter: Secondary | ICD-10-CM | POA: Diagnosis not present

## 2022-07-14 DIAGNOSIS — J301 Allergic rhinitis due to pollen: Secondary | ICD-10-CM | POA: Diagnosis not present

## 2022-07-15 DIAGNOSIS — F3342 Major depressive disorder, recurrent, in full remission: Secondary | ICD-10-CM | POA: Diagnosis not present

## 2022-07-19 ENCOUNTER — Ambulatory Visit: Payer: Commercial Managed Care - PPO | Admitting: Family Medicine

## 2022-07-19 ENCOUNTER — Encounter: Payer: Self-pay | Admitting: Family Medicine

## 2022-07-19 VITALS — BP 122/76 | Ht 69.0 in | Wt 200.0 lb

## 2022-07-19 DIAGNOSIS — S43432D Superior glenoid labrum lesion of left shoulder, subsequent encounter: Secondary | ICD-10-CM | POA: Diagnosis not present

## 2022-07-19 DIAGNOSIS — M25412 Effusion, left shoulder: Secondary | ICD-10-CM | POA: Diagnosis not present

## 2022-07-19 DIAGNOSIS — S4352XD Sprain of left acromioclavicular joint, subsequent encounter: Secondary | ICD-10-CM | POA: Diagnosis not present

## 2022-07-19 DIAGNOSIS — M25512 Pain in left shoulder: Secondary | ICD-10-CM | POA: Diagnosis not present

## 2022-07-19 NOTE — Progress Notes (Signed)
  Albert [name] - 27 y.o. male MRN [id number]  Date of birth: 09-May-1996  SUBJECTIVE:  Including CC & ROS.  No chief complaint on file.   Albert [name] is a 27 y.o. male that is presenting with acute on chronic left shoulder pain.  The pain initially began in November of last year.  He has been in physical therapy and having continued pain.  No improvement with medications.  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   Review of Systems See HPI   HISTORY: Past Medical, Surgical, Social, and Family History Reviewed & Updated per EMR.   Pertinent Historical Findings include:  Past Medical History:  Diagnosis Date   Anxiety with depression 09/30/2016   Bipolar disorder (HCC)    Constipation    History of PSVT (paroxysmal supraventricular tachycardia)    Hyperhidrosis    IBS (irritable bowel syndrome)    Joint pain    Low back pain    Major depression, melancholic type    Schizophrenia ([place])    Thyroid disorder    Vitamin D deficiency     Past Surgical History:  Procedure Laterality Date   HAND RECONSTRUCTION Right 11/2017   WISDOM TOOTH EXTRACTION  11/2015     PHYSICAL EXAM:  VS: BP 122/76   Ht 5' 9"$  (1.753 m)   Wt 200 lb (90.7 kg)   BMI 29.53 kg/m  Physical Exam Gen: NAD, alert, cooperative with exam, well-appearing MSK:  Left shoulder: Limited with external rotation. Pain with external rotation. Weakness with resistance on exam. Positive empty can test and O'Brien's test. 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  Initially presented with pain November 2023.  He was presented with home exercises that included external rotation and scapular range of motion and active shoulder flexion that was to be done 2 times with sets of 3.  He was sent to physical therapy on 06/21/2022 and continues to have pain.  X-ray up to this  point has been normal. -Counseled on home exercise therapy and supportive care. Jennefer Bravo of the left shoulder to evaluate for labral tear and for presurgical planning.

## 2022-07-19 NOTE — Patient Instructions (Signed)
Good to see you Please alternate heat and ice as needed  We'll get the MRI at [facility] imaging   Please send me a message in [place] with any questions or updates.  We'll setup a virtual visit once the MRI is resulted .   --Dr. Raeford Razor

## 2022-07-19 NOTE — Assessment & Plan Note (Signed)
Acute on chronic in nature.  Initially presented with pain November 2023.  He was presented with home exercises that included external rotation and scapular range of motion and active shoulder flexion that was to be done 2 times with sets of 3.  He was sent to physical therapy on 06/21/2022 and continues to have pain.  X-ray up to this point has been normal. -Counseled on home exercise therapy and supportive care. Albert [name] of the left shoulder to evaluate for labral tear and for presurgical planning.

## 2022-07-21 DIAGNOSIS — J301 Allergic rhinitis due to pollen: Secondary | ICD-10-CM | POA: Diagnosis not present

## 2022-07-22 DIAGNOSIS — F3342 Major depressive disorder, recurrent, in full remission: Secondary | ICD-10-CM | POA: Diagnosis not present

## 2022-07-25 ENCOUNTER — Ambulatory Visit
Admission: RE | Admit: 2022-07-25 | Discharge: 2022-07-25 | Disposition: A | Discharge status: Home / Self Care | Payer: Commercial Managed Care - PPO | Source: Ambulatory Visit | Attending: Family Medicine | Admitting: Family Medicine

## 2022-07-25 DIAGNOSIS — S43432D Superior glenoid labrum lesion of left shoulder, subsequent encounter: Secondary | ICD-10-CM

## 2022-07-25 DIAGNOSIS — S43432A Superior glenoid labrum lesion of left shoulder, initial encounter: Secondary | ICD-10-CM | POA: Diagnosis not present

## 2022-07-26 ENCOUNTER — Telehealth (INDEPENDENT_AMBULATORY_CARE_PROVIDER_SITE_OTHER): Payer: Commercial Managed Care - PPO | Admitting: Family Medicine

## 2022-07-26 ENCOUNTER — Encounter: Payer: Self-pay | Admitting: Family Medicine

## 2022-07-26 VITALS — Ht 69.0 in | Wt 200.0 lb

## 2022-07-26 DIAGNOSIS — M19019 Primary osteoarthritis, unspecified shoulder: Secondary | ICD-10-CM | POA: Insufficient documentation

## 2022-07-26 DIAGNOSIS — S43432D Superior glenoid labrum lesion of left shoulder, subsequent encounter: Secondary | ICD-10-CM | POA: Diagnosis not present

## 2022-07-26 NOTE — Progress Notes (Signed)
Virtual Visit via Video Note  I connected with Albert [name] on 07/26/22 at  1:10 PM EST by a video enabled telemedicine application and verified that I am speaking with the correct person using two identifiers.  Location: Patient: home Provider: office   I discussed the limitations of evaluation and management by telemedicine and the availability of in person appointments. The patient expressed understanding and agreed to proceed.  History of Present Illness:  Albert [name] is a 27 year old male that is following up for the MRI of his left shoulder.  This was demonstrating a small posterior labral tear and arthropathy of the [facility] joint and severe marrow edema on either side of the joint.  Observations/Objective:   Assessment and Plan:  Labral tear of left shoulder: Pain is acute on chronic and a small labral tear was appreciated on MRI. -Counseled on home exercise therapy and supportive care. -Could pursue an junction of the [facility] joint or of the glenohumeral joint to determine the source of this pain.  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 - Could consider testing his inflammatory markers as it was weakly positive a few months ago.  Follow Up Instructions:    I discussed the assessment and treatment plan with the patient. The patient was provided an opportunity to ask questions and all were answered. The patient agreed with the plan and demonstrated an understanding of the instructions.   The patient was advised to call back or seek an in-person evaluation if the symptoms worsen or if the condition fails to improve as anticipated.    Clearance Coots, MD

## 2022-07-26 NOTE — Assessment & Plan Note (Signed)
MRI was revealing for severe degenerative changes as well as bone marrow edema around the [facility] joint.: - Counseled on home exercise therapy and supportive care. - Could consider testing his inflammatory markers as it was weakly positive a few months ago.

## 2022-07-26 NOTE — Assessment & Plan Note (Signed)
Pain is acute on chronic and a small labral tear was appreciated on MRI. -Counseled on home exercise therapy and supportive care. -Could pursue an junction of the [facility] joint or of the glenohumeral joint to determine the source of this pain.

## 2022-08-02 ENCOUNTER — Ambulatory Visit: Payer: Self-pay

## 2022-08-02 ENCOUNTER — Encounter: Payer: Self-pay | Admitting: Family Medicine

## 2022-08-02 ENCOUNTER — Ambulatory Visit: Payer: Commercial Managed Care - PPO | Admitting: Family Medicine

## 2022-08-02 VITALS — BP 108/76 | Ht 69.0 in | Wt 200.0 lb

## 2022-08-02 DIAGNOSIS — M19019 Primary osteoarthritis, unspecified shoulder: Secondary | ICD-10-CM | POA: Diagnosis not present

## 2022-08-02 MED ORDER — TRIAMCINOLONE ACETONIDE 40 MG/ML IJ SUSP
40.0000 mg | Freq: Once | INTRAMUSCULAR | Status: AC
  Administered 2022-08-02: 40 mg via INTRA_ARTICULAR

## 2022-08-02 NOTE — Progress Notes (Signed)
  Albert [name] - 27 y.o. male MRN [id number]  Date of birth: 06-12-1995  SUBJECTIVE:  Including CC & ROS.  No chief complaint on file.   Albert [name] is a 27 y.o. male that is  here for a left ac joint injection.    Review of Systems See HPI   HISTORY: Past Medical, Surgical, Social, and Family History Reviewed & Updated per EMR.   Pertinent Historical Findings include:  Past Medical History:  Diagnosis Date   Anxiety with depression 09/30/2016   Bipolar disorder (HCC)    Constipation    History of PSVT (paroxysmal supraventricular tachycardia)    Hyperhidrosis    IBS (irritable bowel syndrome)    Joint pain    Low back pain    Major depression, melancholic type    Schizophrenia ([place])    Thyroid disorder    Vitamin D deficiency     Past Surgical History:  Procedure Laterality Date   HAND RECONSTRUCTION Right 11/2017   WISDOM TOOTH EXTRACTION  11/2015     PHYSICAL EXAM:  VS: BP 108/76 (BP Location: Right Arm, Patient Position: Sitting)   Ht 5\' 9"  (1.753 m)   Wt 200 lb (90.7 kg)   BMI 29.53 kg/m  Physical Exam Gen: NAD, alert, cooperative with exam, well-appearing MSK:  Neurovascularly intact     Aspiration/Injection Procedure Note Albert [name] 08-28-1995  Procedure: Injection Indications: left ac joint pain  Procedure Details Consent: Risks of procedure as well as the alternatives and risks of each were explained to the (patient/caregiver).  Consent for procedure obtained. Time Out: Verified patient identification, verified procedure, site/side was marked, verified correct patient position, special equipment/implants available, medications/allergies/relevent history reviewed, required imaging and test results available.  Performed.  The area was cleaned with iodine and alcohol swabs.    The left AC joint was injected with a mixture containing 1 cc's of 40 mg Kenalog and 1 cc's of 0.25% bupivacaine was injected on a 25-gauge 1-1/2 inch needle.   Ultrasound was used. Images were obtained in short views showing the injection.     A sterile dressing was applied.  Patient did tolerate procedure well.    ASSESSMENT & PLAN:   AC joint arthropathy Locates most of the pain of the proximal portion of the shoulder with changes appreciated on MRI.  He does have a history of a positive ANA and unsure if this is contributing to the changes that are appreciated in his [facility] joint. -Injection today. -ANA, sed rate, CK, CRP

## 2022-08-02 NOTE — Addendum Note (Signed)
Addended by: Cresenciano Lick on: 08/02/2022 10:02 AM   Modules accepted: Orders

## 2022-08-02 NOTE — Patient Instructions (Signed)
Good to see you Please use ice as needed  We'll call with the lab results.   Please send me a message in MyChart with any questions or updates.  Please let me know how you feel in one week.   --Dr. Raeford Razor

## 2022-08-02 NOTE — Assessment & Plan Note (Signed)
Locates most of the pain of the proximal portion of the shoulder with changes appreciated on MRI.  He does have a history of a positive ANA and unsure if this is contributing to the changes that are appreciated in his [facility] joint. -Injection today. -ANA, sed rate, CK, CRP

## 2022-08-03 DIAGNOSIS — M25512 Pain in left shoulder: Secondary | ICD-10-CM | POA: Diagnosis not present

## 2022-08-03 DIAGNOSIS — S4352XD Sprain of left acromioclavicular joint, subsequent encounter: Secondary | ICD-10-CM | POA: Diagnosis not present

## 2022-08-03 DIAGNOSIS — M25412 Effusion, left shoulder: Secondary | ICD-10-CM | POA: Diagnosis not present

## 2022-08-04 DIAGNOSIS — J301 Allergic rhinitis due to pollen: Secondary | ICD-10-CM | POA: Diagnosis not present

## 2022-08-08 ENCOUNTER — Telehealth: Payer: Commercial Managed Care - PPO | Admitting: Family Medicine

## 2022-08-09 DIAGNOSIS — M19019 Primary osteoarthritis, unspecified shoulder: Secondary | ICD-10-CM | POA: Diagnosis not present

## 2022-08-10 DIAGNOSIS — S4352XD Sprain of left acromioclavicular joint, subsequent encounter: Secondary | ICD-10-CM | POA: Diagnosis not present

## 2022-08-10 DIAGNOSIS — M25512 Pain in left shoulder: Secondary | ICD-10-CM | POA: Diagnosis not present

## 2022-08-10 DIAGNOSIS — M25412 Effusion, left shoulder: Secondary | ICD-10-CM | POA: Diagnosis not present

## 2022-08-11 DIAGNOSIS — J301 Allergic rhinitis due to pollen: Secondary | ICD-10-CM | POA: Diagnosis not present

## 2022-08-11 LAB — ANA,IFA RA DIAG PNL W/RFLX TIT/PATN
ANA Titer 1: NEGATIVE
Cyclic Citrullin Peptide Ab: 7 units (ref 0–19)
Rheumatoid fact SerPl-aCnc: 10.9 IU/mL (ref <14.0)

## 2022-08-11 LAB — CK: Total CK: 146 U/L (ref 49–439)

## 2022-08-11 LAB — SEDIMENTATION RATE: Sed Rate: 2 mm/hr (ref 0–15)

## 2022-08-11 LAB — C-REACTIVE PROTEIN: CRP: 1 mg/L (ref 0–10)

## 2022-08-12 DIAGNOSIS — F3342 Major depressive disorder, recurrent, in full remission: Secondary | ICD-10-CM | POA: Diagnosis not present

## 2022-08-15 ENCOUNTER — Telehealth: Payer: Self-pay | Admitting: Family Medicine

## 2022-08-15 NOTE — Telephone Encounter (Signed)
Unable to leave VM for patient. If he calls back please have him speak with a nurse/CMA and inform that his labs returned normal.   If any questions then please take the best time and phone number to call and I will try to call him back.   Rosemarie Ax, MD [facility] 08/15/2022, 12:11 PM

## 2022-08-16 ENCOUNTER — Encounter: Payer: Self-pay | Admitting: Family Medicine

## 2022-08-18 DIAGNOSIS — F3342 Major depressive disorder, recurrent, in full remission: Secondary | ICD-10-CM | POA: Diagnosis not present

## 2022-08-18 DIAGNOSIS — J301 Allergic rhinitis due to pollen: Secondary | ICD-10-CM | POA: Diagnosis not present

## 2022-08-19 DIAGNOSIS — F3341 Major depressive disorder, recurrent, in partial remission: Secondary | ICD-10-CM | POA: Diagnosis not present

## 2022-08-23 DIAGNOSIS — M25412 Effusion, left shoulder: Secondary | ICD-10-CM | POA: Diagnosis not present

## 2022-08-23 DIAGNOSIS — S4352XD Sprain of left acromioclavicular joint, subsequent encounter: Secondary | ICD-10-CM | POA: Diagnosis not present

## 2022-08-23 DIAGNOSIS — M25512 Pain in left shoulder: Secondary | ICD-10-CM | POA: Diagnosis not present

## 2022-08-25 DIAGNOSIS — J301 Allergic rhinitis due to pollen: Secondary | ICD-10-CM | POA: Diagnosis not present

## 2022-08-31 ENCOUNTER — Other Ambulatory Visit (HOSPITAL_BASED_OUTPATIENT_CLINIC_OR_DEPARTMENT_OTHER): Payer: Self-pay

## 2022-09-01 DIAGNOSIS — J301 Allergic rhinitis due to pollen: Secondary | ICD-10-CM | POA: Diagnosis not present

## 2022-09-02 DIAGNOSIS — F3342 Major depressive disorder, recurrent, in full remission: Secondary | ICD-10-CM | POA: Diagnosis not present

## 2022-09-05 ENCOUNTER — Encounter: Payer: Self-pay | Admitting: *Deleted

## 2022-09-06 DIAGNOSIS — M25412 Effusion, left shoulder: Secondary | ICD-10-CM | POA: Diagnosis not present

## 2022-09-06 DIAGNOSIS — S4352XD Sprain of left acromioclavicular joint, subsequent encounter: Secondary | ICD-10-CM | POA: Diagnosis not present

## 2022-09-06 DIAGNOSIS — M25512 Pain in left shoulder: Secondary | ICD-10-CM | POA: Diagnosis not present

## 2022-09-09 DIAGNOSIS — F3342 Major depressive disorder, recurrent, in full remission: Secondary | ICD-10-CM | POA: Diagnosis not present

## 2022-09-13 DIAGNOSIS — F3342 Major depressive disorder, recurrent, in full remission: Secondary | ICD-10-CM | POA: Diagnosis not present

## 2022-09-15 DIAGNOSIS — J301 Allergic rhinitis due to pollen: Secondary | ICD-10-CM | POA: Diagnosis not present

## 2022-09-20 DIAGNOSIS — M25512 Pain in left shoulder: Secondary | ICD-10-CM | POA: Diagnosis not present

## 2022-09-20 DIAGNOSIS — S4352XD Sprain of left acromioclavicular joint, subsequent encounter: Secondary | ICD-10-CM | POA: Diagnosis not present

## 2022-09-20 DIAGNOSIS — M25412 Effusion, left shoulder: Secondary | ICD-10-CM | POA: Diagnosis not present

## 2022-09-22 DIAGNOSIS — J301 Allergic rhinitis due to pollen: Secondary | ICD-10-CM | POA: Diagnosis not present

## 2022-09-23 DIAGNOSIS — F3342 Major depressive disorder, recurrent, in full remission: Secondary | ICD-10-CM | POA: Diagnosis not present

## 2022-09-29 DIAGNOSIS — F3342 Major depressive disorder, recurrent, in full remission: Secondary | ICD-10-CM | POA: Diagnosis not present

## 2022-09-29 DIAGNOSIS — F411 Generalized anxiety disorder: Secondary | ICD-10-CM | POA: Diagnosis not present

## 2022-09-29 DIAGNOSIS — J301 Allergic rhinitis due to pollen: Secondary | ICD-10-CM | POA: Diagnosis not present

## 2022-10-03 ENCOUNTER — Encounter: Payer: Self-pay | Admitting: Family Medicine

## 2022-10-03 ENCOUNTER — Telehealth (INDEPENDENT_AMBULATORY_CARE_PROVIDER_SITE_OTHER): Payer: Commercial Managed Care - PPO | Admitting: Family Medicine

## 2022-10-03 ENCOUNTER — Other Ambulatory Visit (HOSPITAL_BASED_OUTPATIENT_CLINIC_OR_DEPARTMENT_OTHER): Payer: Self-pay

## 2022-10-03 VITALS — Ht 69.0 in

## 2022-10-03 DIAGNOSIS — B349 Viral infection, unspecified: Secondary | ICD-10-CM

## 2022-10-03 MED ORDER — PROMETHAZINE-DM 6.25-15 MG/5ML PO SYRP
5.0000 mL | ORAL_SOLUTION | Freq: Four times a day (QID) | ORAL | 0 refills | Status: DC | PRN
Start: 2022-10-03 — End: 2022-11-25
  Filled 2022-10-03: qty 118, 6d supply, fill #0

## 2022-10-03 NOTE — Progress Notes (Signed)
Established Patient Office Visit   Subjective:  Patient ID: Albert [name], male    DOB: 1995/06/07  Age: 27 y.o. MRN: [id number]  Chief Complaint  Patient presents with   Sore Throat    Sore throat, headaches body aches, runny nose chills, lots of drainage x 3 days. Last night vomiting diarrhea no appetite.     Sore Throat  Associated symptoms include congestion, coughing, diarrhea, headaches and vomiting. Pertinent negatives include no abdominal pain.   Encounter Diagnoses  Name Primary?   Viral illness Yes   4 day history of headache with chills, congestion, postnasal drip, sore throat, cough, myalgias and arthralgias.  More recently there is been nausea with vomiting and diarrhea.  He denies wheezing, difficulty breathing or history of asthma.  He does not smoke.  He has been taking ibuprofen.  No rash.   Review of Systems  Constitutional:  Positive for chills and malaise/fatigue.  HENT:  Positive for congestion and sore throat.   Eyes:  Negative for blurred vision, discharge and redness.  Respiratory:  Positive for cough. Negative for sputum production and wheezing.   Cardiovascular: Negative.   Gastrointestinal:  Positive for diarrhea, nausea and vomiting. Negative for abdominal pain.  Genitourinary: Negative.   Musculoskeletal:  Positive for joint pain and myalgias.  Skin:  Negative for rash.  Neurological:  Positive for headaches. Negative for tingling, loss of consciousness and weakness.  Endo/Heme/Allergies:  Negative for polydipsia.     Current Outpatient Medications:    EPINEPHrine 0.3 mg/0.3 mL IJ SOAJ injection, Inject 0.3 mLs (0.3 mg total) into the muscle once as needed for up to 1 dose for Anaphylaxis., Disp: 2 each, Rfl: 2   famotidine (PEPCID) 40 MG tablet, Take 1 tablet (40 mg total) by mouth daily., Disp: 30 tablet, Rfl: 5   levocetirizine (XYZAL) 5 MG tablet, Take 1 tablet (5 mg total) by mouth every evening., Disp: 30 tablet, Rfl: 2   promethazine  (PHENERGAN) 12.5 MG tablet, Take 1 tablet (12.5 mg total) by mouth every 8 (eight) hours as needed for nausea., Disp: 30 tablet, Rfl: 3   promethazine-dextromethorphan (PROMETHAZINE-DM) 6.25-15 MG/5ML syrup, Take 5 mLs by mouth 4 (four) times daily as needed for cough., Disp: 118 mL, Rfl: 0   pyridoxine (B-6) 100 MG tablet, Take 100 mg by mouth daily., Disp: , Rfl:    chlordiazePOXIDE (LIBRIUM) 10 MG capsule, Take 1 capsule (10 mg total) by mouth at bedtime as needed for anxiety. (Patient not taking: Reported on 10/03/2022), Disp: 90 capsule, Rfl: 0   fluticasone (FLONASE) 50 MCG/ACT nasal spray, Administer 2 sprays in each nostril daily. (Patient not taking: Reported on 10/03/2022), Disp: 48 g, Rfl: 3   Levomefolate Glucosamine (METHYLFOLATE) 400 MCG CAPS, Take 1 capsule by mouth daily. (Patient not taking: Reported on 10/03/2022), Disp: 30 capsule, Rfl: 3   lithium carbonate (LITHOBID) 300 MG ER tablet, Take 1 tablet (300 mg total) by mouth daily with breakfast AND 2 tablets (600 mg total) daily with dinner. Do all this for 30 days. (Patient not taking: Reported on 10/03/2022), Disp: 90 tablet, Rfl: 0   montelukast (SINGULAIR) 10 MG tablet, Take 1 tablet (10 mg total) by mouth at bedtime. (Patient not taking: Reported on 10/03/2022), Disp: 90 tablet, Rfl: 3   omeprazole (PRILOSEC) 40 MG capsule, Take 1 capsule (40 mg total) by mouth daily. (Patient not taking: Reported on 10/03/2022), Disp: 30 capsule, Rfl: 5   Semaglutide-Weight Management (WEGOVY) 1.7 MG/0.75ML SOAJ, Inject 1.7 mg into  the skin once a week. (Patient not taking: Reported on 10/03/2022), Disp: 3 mL, Rfl: 0   Objective:     Ht 5\' 9"  (1.753 m)   BMI 29.53 kg/m    Physical Exam Constitutional:      General: He is not in acute distress.    Appearance: Normal appearance. He is not ill-appearing, toxic-appearing or diaphoretic.  HENT:     Head: Normocephalic and atraumatic.     Right Ear: External ear normal.     Left Ear: External  ear normal.  Eyes:     General: No scleral icterus.       Right eye: No discharge.        Left eye: No discharge.     Extraocular Movements: Extraocular movements intact.     Conjunctiva/sclera: Conjunctivae normal.  Pulmonary:     Effort: Pulmonary effort is normal. No respiratory distress.  Skin:    General: Skin is warm and dry.  Neurological:     Mental Status: He is alert and oriented to person, place, and time.  Psychiatric:        Mood and Affect: Mood normal.        Behavior: Behavior normal.      No results found for any visits on 10/03/22.    The ASCVD Risk score (Arnett DK, et al., 2019) failed to calculate for the following reasons:   The 2019 ASCVD risk score is only valid for ages 17 to 51    Assessment & Plan:   Viral illness -     Promethazine-DM; Take 5 mLs by mouth 4 (four) times daily as needed for cough.  Dispense: 118 mL; Refill: 0    Return follow up Thursday or Friday if not improving preferably here at the clinic.Mliss Sax, MD  Virtual Visit via Video Note  I connected with Leone Checchi Collister on 10/03/22 at  3:00 PM EDT by a video enabled telemedicine application and verified that I am speaking with the correct person using two identifiers.  Location: Patient: home alone in a room.  Provider: work   I discussed the limitations of evaluation and management by telemedicine and the availability of in person appointments. The patient expressed understanding and agreed to proceed.  History of Present Illness:    Observations/Objective:   Assessment and Plan:   Follow Up Instructions:    I discussed the assessment and treatment plan with the patient. The patient was provided an opportunity to ask questions and all were answered. The patient agreed with the plan and demonstrated an understanding of the instructions.   The patient was advised to call back or seek an in-person evaluation if the symptoms worsen or if the  condition fails to improve as anticipated.  I provided 20 minutes of non-face-to-face time during this encounter.   Mliss Sax, MD

## 2022-10-06 DIAGNOSIS — F411 Generalized anxiety disorder: Secondary | ICD-10-CM | POA: Diagnosis not present

## 2022-10-06 DIAGNOSIS — F3342 Major depressive disorder, recurrent, in full remission: Secondary | ICD-10-CM | POA: Diagnosis not present

## 2022-10-12 DIAGNOSIS — J301 Allergic rhinitis due to pollen: Secondary | ICD-10-CM | POA: Diagnosis not present

## 2022-10-13 DIAGNOSIS — F3342 Major depressive disorder, recurrent, in full remission: Secondary | ICD-10-CM | POA: Diagnosis not present

## 2022-10-13 DIAGNOSIS — F411 Generalized anxiety disorder: Secondary | ICD-10-CM | POA: Diagnosis not present

## 2022-10-19 DIAGNOSIS — J301 Allergic rhinitis due to pollen: Secondary | ICD-10-CM | POA: Diagnosis not present

## 2022-10-20 DIAGNOSIS — F332 Major depressive disorder, recurrent severe without psychotic features: Secondary | ICD-10-CM | POA: Diagnosis not present

## 2022-10-20 DIAGNOSIS — F411 Generalized anxiety disorder: Secondary | ICD-10-CM | POA: Diagnosis not present

## 2022-10-25 DIAGNOSIS — F332 Major depressive disorder, recurrent severe without psychotic features: Secondary | ICD-10-CM | POA: Diagnosis not present

## 2022-10-25 DIAGNOSIS — F411 Generalized anxiety disorder: Secondary | ICD-10-CM | POA: Diagnosis not present

## 2022-10-27 DIAGNOSIS — F332 Major depressive disorder, recurrent severe without psychotic features: Secondary | ICD-10-CM | POA: Diagnosis not present

## 2022-10-27 DIAGNOSIS — F411 Generalized anxiety disorder: Secondary | ICD-10-CM | POA: Diagnosis not present

## 2022-10-31 DIAGNOSIS — J301 Allergic rhinitis due to pollen: Secondary | ICD-10-CM | POA: Diagnosis not present

## 2022-11-01 DIAGNOSIS — F332 Major depressive disorder, recurrent severe without psychotic features: Secondary | ICD-10-CM | POA: Diagnosis not present

## 2022-11-01 DIAGNOSIS — F411 Generalized anxiety disorder: Secondary | ICD-10-CM | POA: Diagnosis not present

## 2022-11-03 DIAGNOSIS — F411 Generalized anxiety disorder: Secondary | ICD-10-CM | POA: Diagnosis not present

## 2022-11-03 DIAGNOSIS — F332 Major depressive disorder, recurrent severe without psychotic features: Secondary | ICD-10-CM | POA: Diagnosis not present

## 2022-11-08 DIAGNOSIS — F411 Generalized anxiety disorder: Secondary | ICD-10-CM | POA: Diagnosis not present

## 2022-11-08 DIAGNOSIS — F332 Major depressive disorder, recurrent severe without psychotic features: Secondary | ICD-10-CM | POA: Diagnosis not present

## 2022-11-10 DIAGNOSIS — F332 Major depressive disorder, recurrent severe without psychotic features: Secondary | ICD-10-CM | POA: Diagnosis not present

## 2022-11-10 DIAGNOSIS — F411 Generalized anxiety disorder: Secondary | ICD-10-CM | POA: Diagnosis not present

## 2022-11-11 ENCOUNTER — Telehealth: Payer: Self-pay | Admitting: Family Medicine

## 2022-11-11 NOTE — Telephone Encounter (Signed)
Patient called and would like a copy of immunizations and has a couple of questions regarding vaccines he has received and has not received. Please call.

## 2022-11-14 DIAGNOSIS — J301 Allergic rhinitis due to pollen: Secondary | ICD-10-CM | POA: Diagnosis not present

## 2022-11-14 NOTE — Telephone Encounter (Signed)
Printed TB blood test/patient informed and will pickup copy of results.

## 2022-11-14 NOTE — Telephone Encounter (Signed)
Patient is needing a copy of his last TB test results.

## 2022-11-15 DIAGNOSIS — F411 Generalized anxiety disorder: Secondary | ICD-10-CM | POA: Diagnosis not present

## 2022-11-15 DIAGNOSIS — F332 Major depressive disorder, recurrent severe without psychotic features: Secondary | ICD-10-CM | POA: Diagnosis not present

## 2022-11-17 DIAGNOSIS — F332 Major depressive disorder, recurrent severe without psychotic features: Secondary | ICD-10-CM | POA: Diagnosis not present

## 2022-11-17 DIAGNOSIS — F411 Generalized anxiety disorder: Secondary | ICD-10-CM | POA: Diagnosis not present

## 2022-11-18 DIAGNOSIS — F329 Major depressive disorder, single episode, unspecified: Secondary | ICD-10-CM | POA: Diagnosis not present

## 2022-11-22 ENCOUNTER — Encounter: Payer: Self-pay | Admitting: Family Medicine

## 2022-11-22 DIAGNOSIS — F411 Generalized anxiety disorder: Secondary | ICD-10-CM | POA: Diagnosis not present

## 2022-11-22 DIAGNOSIS — F332 Major depressive disorder, recurrent severe without psychotic features: Secondary | ICD-10-CM | POA: Diagnosis not present

## 2022-11-25 ENCOUNTER — Encounter: Payer: Self-pay | Admitting: Family Medicine

## 2022-11-25 ENCOUNTER — Ambulatory Visit: Payer: Commercial Managed Care - PPO | Admitting: Family Medicine

## 2022-11-25 VITALS — BP 120/78 | HR 73 | Temp 97.5°F | Ht 68.5 in | Wt 192.0 lb

## 2022-11-25 DIAGNOSIS — R3915 Urgency of urination: Secondary | ICD-10-CM | POA: Diagnosis not present

## 2022-11-25 LAB — URINALYSIS, MICROSCOPIC ONLY
RBC / HPF: NONE SEEN (ref 0–?)
WBC, UA: NONE SEEN (ref 0–?)

## 2022-11-25 NOTE — Patient Instructions (Signed)
We will be in touch with your results.   Stay hydrated.   Let us know if you need anything.

## 2022-11-25 NOTE — Progress Notes (Signed)
Chief Complaint  Patient presents with   Follow-up    Possible referral to urology    Albert [name] is a 27 y.o. male here for possible UTI.  Duration: 2 weeks. Symptoms: urinary frequency, hematuria, and Urgency Denies: urinary hesitancy, urinary retention, fever, nausea, vomiting, urinary incontinence, constipation, abd pain, flank pain, discharge Hx of recurrent UTI? No Tried pushing fluids and used ibuprofen (was not using prior to episodes) Denies new sexual partners.  Past Medical History:  Diagnosis Date   Anxiety with depression 09/30/2016   Bipolar disorder (HCC)    Constipation    History of PSVT (paroxysmal supraventricular tachycardia)    Hyperhidrosis    IBS (irritable bowel syndrome)    Joint pain    Low back pain    Major depression, melancholic type    Schizophrenia (HCC)    Thyroid disorder    Vitamin D deficiency      BP 120/78 (BP Location: Left Arm, Patient Position: Sitting, Cuff Size: Normal)   Pulse 73   Temp (!) 97.5 F (36.4 C) (Oral)   Ht 5' 8.5" (1.74 m)   Wt 192 lb (87.1 kg)   SpO2 99%   BMI 28.77 kg/m  General: Awake, alert, appears stated age Heart: RRR Lungs: CTAB, normal respiratory effort, no accessory muscle usage Abd: BS+, soft, NT, ND, no masses or organomegaly MSK: No CVA tenderness, neg Lloyd's sign Psych: Age appropriate judgment and insight  Urinary urgency - Plan: Urine Microscopic Only, Urine Culture  Stay hydrated. Tylenol prn. No gross hematuria. Sounds like an infection. If +, will refer to urology.  Seek immediate care if pt starts to develop fevers, new/worsening symptoms, uncontrollable N/V. F/u prn. The patient voiced understanding and agreement to the plan.  Jilda [place], DO 11/25/22 10:03 AM

## 2022-11-26 LAB — URINE CULTURE
MICRO NUMBER:: 15164641
Result:: NO GROWTH
SPECIMEN QUALITY:: ADEQUATE

## 2022-11-28 DIAGNOSIS — J301 Allergic rhinitis due to pollen: Secondary | ICD-10-CM | POA: Diagnosis not present

## 2022-11-29 DIAGNOSIS — F332 Major depressive disorder, recurrent severe without psychotic features: Secondary | ICD-10-CM | POA: Diagnosis not present

## 2022-11-29 DIAGNOSIS — F411 Generalized anxiety disorder: Secondary | ICD-10-CM | POA: Diagnosis not present

## 2022-11-30 ENCOUNTER — Other Ambulatory Visit: Payer: Self-pay | Admitting: Family Medicine

## 2022-11-30 DIAGNOSIS — R3915 Urgency of urination: Secondary | ICD-10-CM

## 2022-12-01 DIAGNOSIS — F411 Generalized anxiety disorder: Secondary | ICD-10-CM | POA: Diagnosis not present

## 2022-12-01 DIAGNOSIS — F332 Major depressive disorder, recurrent severe without psychotic features: Secondary | ICD-10-CM | POA: Diagnosis not present

## 2022-12-06 DIAGNOSIS — F411 Generalized anxiety disorder: Secondary | ICD-10-CM | POA: Diagnosis not present

## 2022-12-06 DIAGNOSIS — F332 Major depressive disorder, recurrent severe without psychotic features: Secondary | ICD-10-CM | POA: Diagnosis not present

## 2022-12-08 DIAGNOSIS — F332 Major depressive disorder, recurrent severe without psychotic features: Secondary | ICD-10-CM | POA: Diagnosis not present

## 2022-12-08 DIAGNOSIS — F411 Generalized anxiety disorder: Secondary | ICD-10-CM | POA: Diagnosis not present

## 2022-12-12 DIAGNOSIS — J301 Allergic rhinitis due to pollen: Secondary | ICD-10-CM | POA: Diagnosis not present

## 2022-12-13 DIAGNOSIS — E039 Hypothyroidism, unspecified: Secondary | ICD-10-CM | POA: Diagnosis not present

## 2022-12-13 DIAGNOSIS — F332 Major depressive disorder, recurrent severe without psychotic features: Secondary | ICD-10-CM | POA: Diagnosis not present

## 2022-12-13 DIAGNOSIS — L74519 Primary focal hyperhidrosis, unspecified: Secondary | ICD-10-CM | POA: Diagnosis not present

## 2022-12-13 DIAGNOSIS — E291 Testicular hypofunction: Secondary | ICD-10-CM | POA: Diagnosis not present

## 2022-12-13 DIAGNOSIS — F411 Generalized anxiety disorder: Secondary | ICD-10-CM | POA: Diagnosis not present

## 2022-12-15 DIAGNOSIS — F332 Major depressive disorder, recurrent severe without psychotic features: Secondary | ICD-10-CM | POA: Diagnosis not present

## 2022-12-15 DIAGNOSIS — F411 Generalized anxiety disorder: Secondary | ICD-10-CM | POA: Diagnosis not present

## 2022-12-15 IMAGING — CT CT RENAL STONE PROTOCOL
2 of 4 series · 16 of 46 positions shown, 18 images · non-contrast
Comparison: None.

CLINICAL DATA: Flank pain.  Evaluate for kidney stones

EXAM:
CT ABDOMEN AND PELVIS WITHOUT CONTRAST
TECHNIQUE: Multidetector CT imaging of the abdomen and pelvis was performed
following the standard protocol without IV contrast.

[Series 2: axial st · axial · 0.92mm/px · z∈[-564,-89]mm · 13 of 105 slices shown, 15 images]
[im 5/105  soft-tissue]
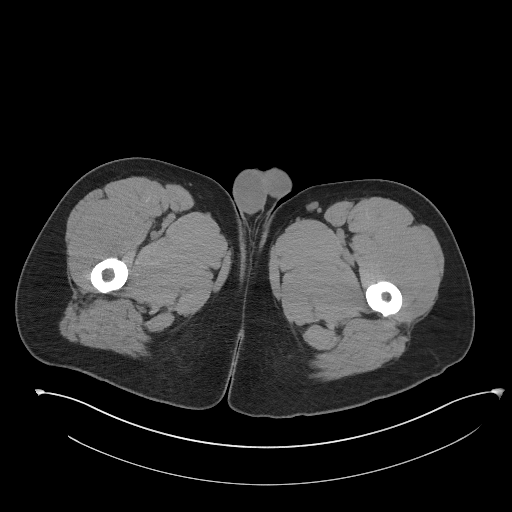
[im 5/105  bone]
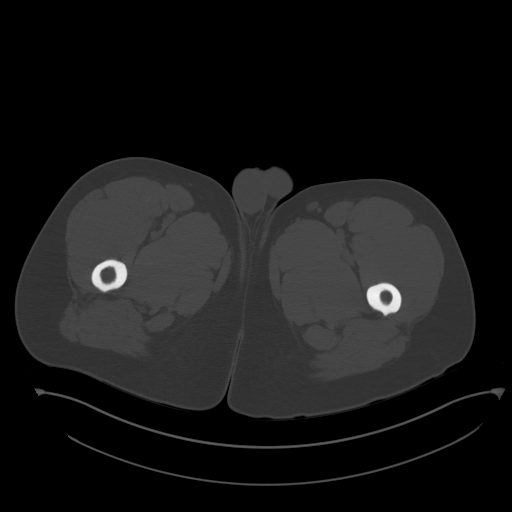
[im 14/105  soft-tissue]
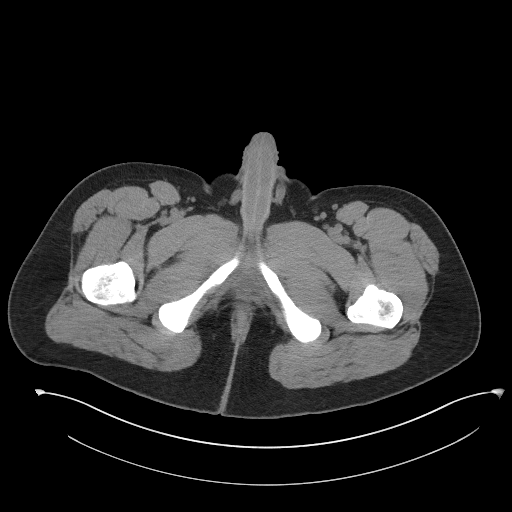
[im 23/105  soft-tissue]
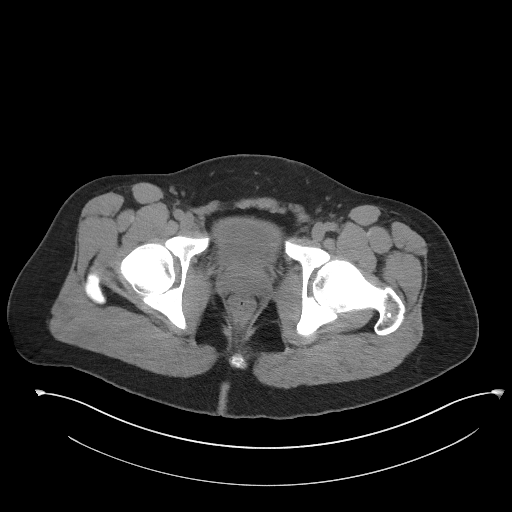
[im 28/105  soft-tissue]
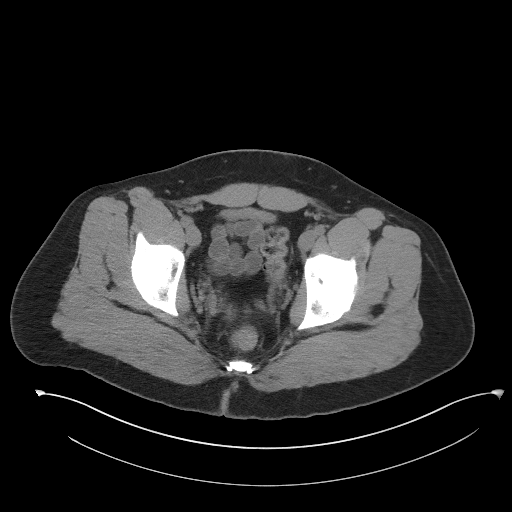
[im 37/105  soft-tissue]
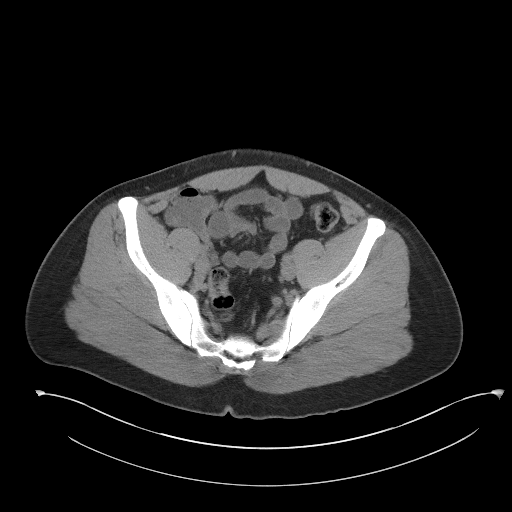
[im 46/105  soft-tissue]
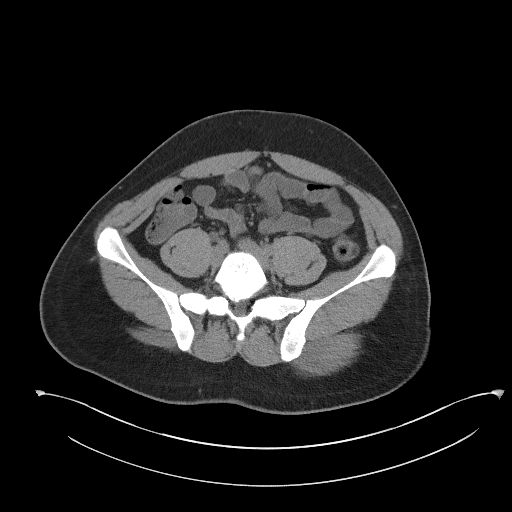
[im 55/105  soft-tissue]
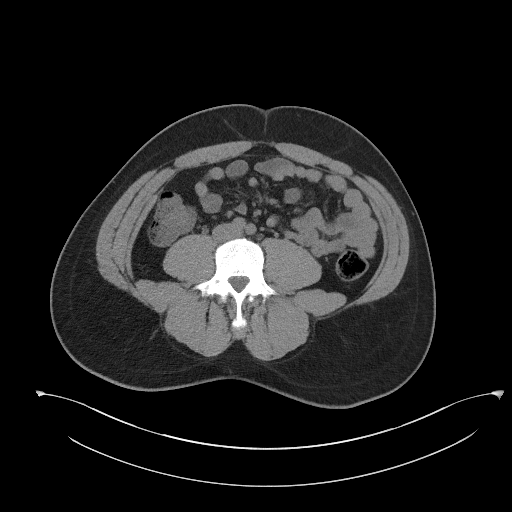
[im 59/105  soft-tissue]
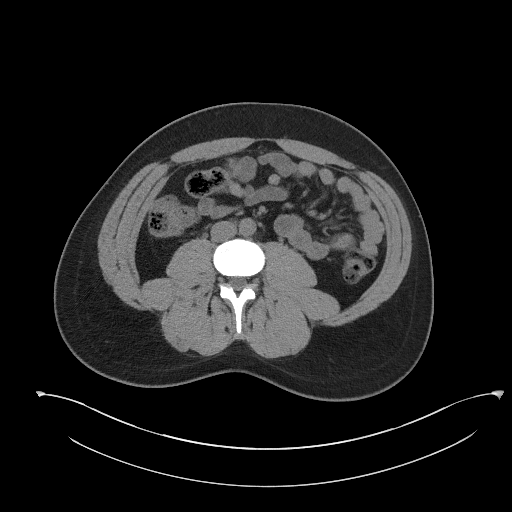
[im 68/105  soft-tissue]
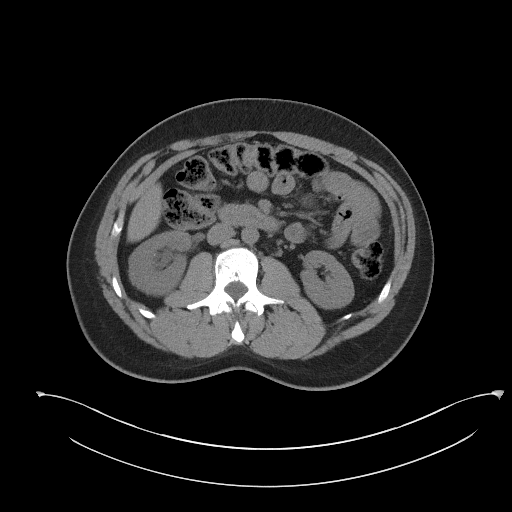
[im 68/105  bone]
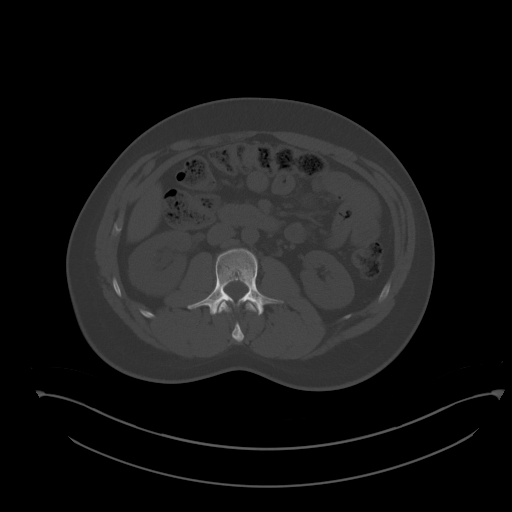
[im 77/105  soft-tissue]
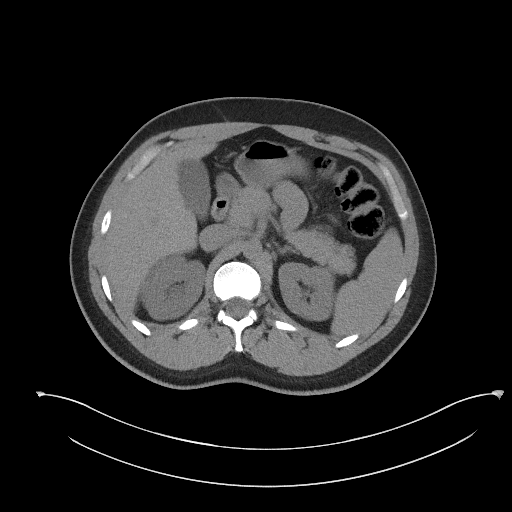
[im 82/105  soft-tissue]
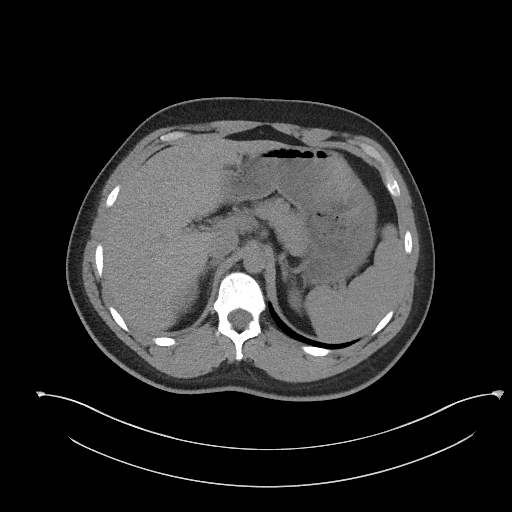
[im 91/105  soft-tissue]
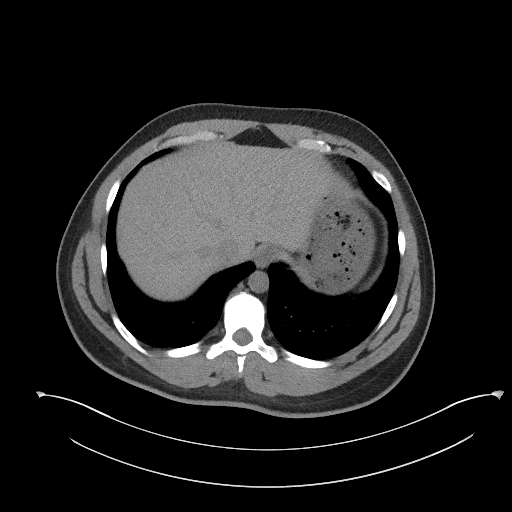
[im 100/105  soft-tissue]
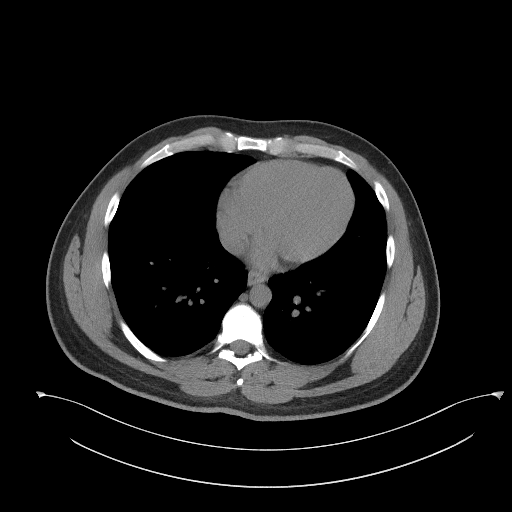

[Series 5: coronal st · coronal · 0.82mm/px · 3 of 101 slices shown]
[im 34/101  soft-tissue]
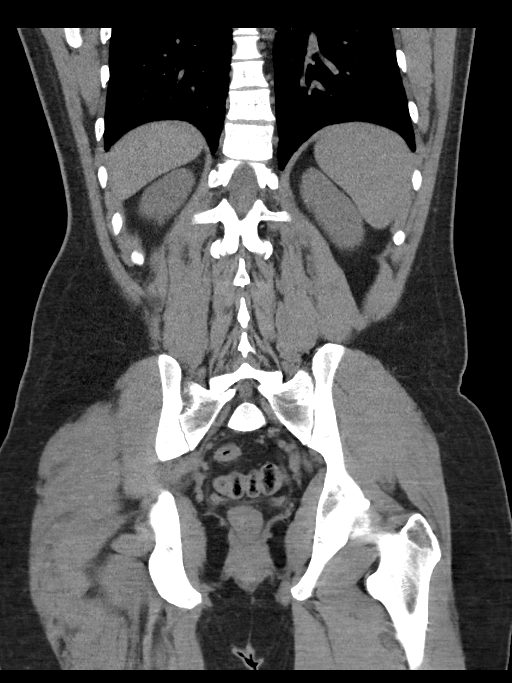
[im 45/101  soft-tissue]
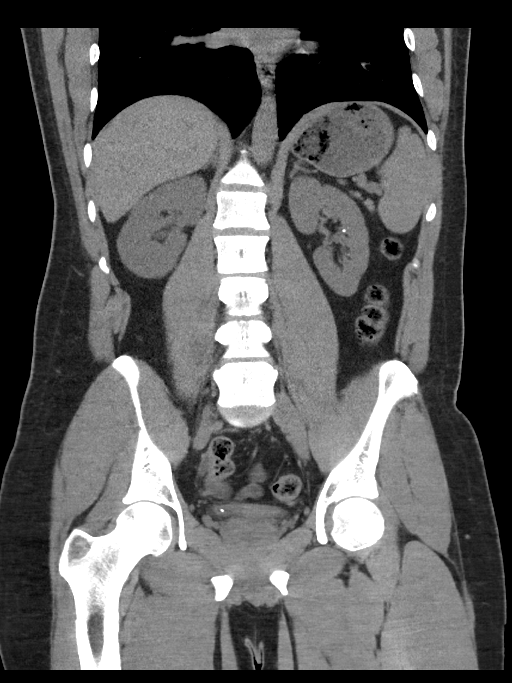
[im 56/101  soft-tissue]
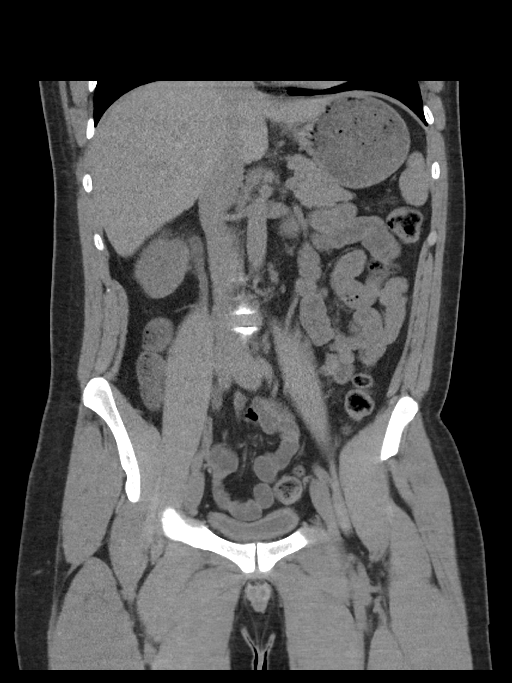

[16 of 46 positions shown; findings below may reference images not displayed]

FINDINGS: Lower chest: No acute abnormality.

Hepatobiliary: No focal liver abnormality is seen. No gallstones,
gallbladder wall thickening, or biliary dilatation.

Pancreas: Unremarkable. No pancreatic ductal dilatation or
surrounding inflammatory changes.

Spleen: Normal in size without focal abnormality.

Adrenals/Urinary Tract: Normal adrenal glands. Several stones are
identified within the upper and lower pole collecting system of the
left kidney. These measure up to 3 mm. Punctate stone noted within
the inferior pole collecting system of the right kidney. There is
mild right hydronephrosis and hydroureter. Within the distal right
ureter just before the UVJ there is a stone measuring 2.5 mm, image
80/2. Bladder unremarkable.

Stomach/Bowel: Stomach is within normal limits. Appendix appears
normal. No evidence of bowel wall thickening, distention, or
inflammatory changes.

Vascular/Lymphatic: No significant vascular findings are present. No
enlarged abdominal or pelvic lymph nodes.

Reproductive: Prostate is unremarkable.

Other: No free fluid or fluid collections.

Musculoskeletal: No acute or significant osseous findings.
IMPRESSION: 1. Right-sided hydronephrosis and hydroureter secondary to 2.5 mm
distal right ureteral calculus.
2. Bilateral nephrolithiasis.

## 2022-12-20 DIAGNOSIS — F411 Generalized anxiety disorder: Secondary | ICD-10-CM | POA: Diagnosis not present

## 2022-12-20 DIAGNOSIS — F332 Major depressive disorder, recurrent severe without psychotic features: Secondary | ICD-10-CM | POA: Diagnosis not present

## 2022-12-21 ENCOUNTER — Encounter: Payer: Self-pay | Admitting: Urology

## 2022-12-21 ENCOUNTER — Ambulatory Visit: Payer: Commercial Managed Care - PPO | Admitting: Urology

## 2022-12-21 ENCOUNTER — Other Ambulatory Visit (HOSPITAL_BASED_OUTPATIENT_CLINIC_OR_DEPARTMENT_OTHER): Payer: Self-pay

## 2022-12-21 VITALS — BP 118/74 | HR 72 | Ht 69.0 in | Wt 190.0 lb

## 2022-12-21 DIAGNOSIS — N200 Calculus of kidney: Secondary | ICD-10-CM | POA: Insufficient documentation

## 2022-12-21 DIAGNOSIS — Z87442 Personal history of urinary calculi: Secondary | ICD-10-CM | POA: Diagnosis not present

## 2022-12-21 DIAGNOSIS — R3915 Urgency of urination: Secondary | ICD-10-CM | POA: Diagnosis not present

## 2022-12-21 DIAGNOSIS — J301 Allergic rhinitis due to pollen: Secondary | ICD-10-CM | POA: Diagnosis not present

## 2022-12-21 DIAGNOSIS — R310 Gross hematuria: Secondary | ICD-10-CM | POA: Diagnosis not present

## 2022-12-21 LAB — URINALYSIS, ROUTINE W REFLEX MICROSCOPIC
Bilirubin, UA: NEGATIVE
Glucose, UA: NEGATIVE
Ketones, UA: NEGATIVE
Leukocytes,UA: NEGATIVE
Nitrite, UA: NEGATIVE
Protein,UA: NEGATIVE
RBC, UA: NEGATIVE
Specific Gravity, UA: 1.01 (ref 1.005–1.030)
Urobilinogen, Ur: 0.2 mg/dL (ref 0.2–1.0)
pH, UA: 6 (ref 5.0–7.5)

## 2022-12-21 LAB — BLADDER SCAN AMB NON-IMAGING

## 2022-12-21 MED ORDER — "TUBERCULIN SYRINGE 27G X 1/2"" 1 ML MISC"
3 refills | Status: AC
  Filled 2022-12-21: qty 50, 350d supply, fill #0
  Filled 2023-01-13: qty 10, 28d supply, fill #0

## 2022-12-21 NOTE — Progress Notes (Signed)
Assessment: 1. Gross hematuria   2. Urinary urgency   3. Nephrolithiasis     Plan: I personally reviewed the patient's chart including provider notes, lab and imaging results. Recommend further evaluation with a CT renal stone study. Will call him with results.  Chief Complaint:  Chief Complaint  Patient presents with   Urinary Urgency    History of Present Illness:  Albert [name] is a 27 y.o. male who is seen in consultation from Sharlene Dory, DO for evaluation of urinary urgency and gross hematuria.  He had onset of some intermittent gross hematuria approximately 2 months ago.  He noted this primarily after running.  This resolved after several weeks.  No flank pain.  Following this he had onset of frequency and urgency.  He also had some discomfort with the need to void and then developed some dysuria.  He underwent evaluation for a possible UTI.  Urine culture showed no growth.  His symptoms resolved approximately 5 days ago.  He does not have any urinary symptoms at the present time.  No further gross hematuria.  No flank pain.  He has a prior history of a kidney stone in 2022.  He passed the stone spontaneously. CT from 04/21/2021 showed a 2.5 mm right distal ureteral calculus and bilateral nephrolithiasis.  Past Medical History:  Past Medical History:  Diagnosis Date   Anxiety with depression 09/30/2016   Bipolar disorder (HCC)    Constipation    History of PSVT (paroxysmal supraventricular tachycardia)    Hyperhidrosis    IBS (irritable bowel syndrome)    Joint pain    Low back pain    Major depression, melancholic type    Schizophrenia (HCC)    Thyroid disorder    Vitamin D deficiency     Past Surgical History:  Past Surgical History:  Procedure Laterality Date   HAND RECONSTRUCTION Right 11/2017   WISDOM TOOTH EXTRACTION  11/2015    Allergies:  Allergies  Allergen Reactions   Sulfa Antibiotics     Family history of severe reactions     Family History:  Family History  Problem Relation Age of Onset   Anxiety disorder Mother    Depression Mother    Sudden death Neg Hx    Heart attack Neg Hx     Social History:  Social History   Tobacco Use   Smoking status: Never   Smokeless tobacco: Never  Vaping Use   Vaping status: Never Used  Substance Use Topics   Alcohol use: No   Drug use: No    Review of symptoms:  Constitutional:  Negative for unexplained weight loss, night sweats, fever, chills ENT:  Negative for nose bleeds, sinus pain, painful swallowing CV:  Negative for chest pain, shortness of breath, exercise intolerance, palpitations, loss of consciousness Resp:  Negative for cough, wheezing, shortness of breath GI:  Negative for nausea, vomiting, diarrhea, bloody stools GU:  Positives noted in HPI; otherwise negative for urinary incontinence Neuro:  Negative for seizures, poor balance, limb weakness, slurred speech Psych:  Negative for lack of energy, depression, anxiety Endocrine:  Negative for polydipsia, polyuria, symptoms of hypoglycemia (dizziness, hunger, sweating) Hematologic:  Negative for anemia, purpura, petechia, prolonged or excessive bleeding, use of anticoagulants  Allergic:  Negative for difficulty breathing or choking as a result of exposure to anything; no shellfish allergy; no allergic response (rash/itch) to materials, foods  Physical exam: BP 118/74   Pulse 72   Ht 5\' 9"  (1.753 m)  Wt 190 lb (86.2 kg)   BMI 28.06 kg/m  GENERAL APPEARANCE:  Well appearing, well developed, well nourished, NAD HEENT: Atraumatic, Normocephalic, oropharynx clear. NECK: Supple without lymphadenopathy or thyromegaly. LUNGS: Clear to auscultation bilaterally. HEART: Regular Rate and Rhythm without murmurs, gallops, or rubs. ABDOMEN: Soft, non-tender, No Masses. EXTREMITIES: Moves all extremities well.  Without clubbing, cyanosis, or edema. NEUROLOGIC:  Alert and oriented x 3, normal gait, CN II-XII  grossly intact.  MENTAL STATUS:  Appropriate. BACK:  Non-tender to palpation.  No CVAT SKIN:  Warm, dry and intact.    Results: U/A: negative  PVR: 39 ml

## 2022-12-22 ENCOUNTER — Other Ambulatory Visit (HOSPITAL_BASED_OUTPATIENT_CLINIC_OR_DEPARTMENT_OTHER): Payer: Self-pay

## 2022-12-23 ENCOUNTER — Ambulatory Visit (HOSPITAL_BASED_OUTPATIENT_CLINIC_OR_DEPARTMENT_OTHER)
Admission: RE | Admit: 2022-12-23 | Discharge: 2022-12-23 | Disposition: A | Discharge status: Home / Self Care | Payer: Commercial Managed Care - PPO | Source: Ambulatory Visit | Attending: Urology | Admitting: Urology

## 2022-12-23 ENCOUNTER — Other Ambulatory Visit (HOSPITAL_BASED_OUTPATIENT_CLINIC_OR_DEPARTMENT_OTHER): Payer: Self-pay

## 2022-12-23 DIAGNOSIS — N200 Calculus of kidney: Secondary | ICD-10-CM | POA: Diagnosis not present

## 2022-12-23 DIAGNOSIS — R310 Gross hematuria: Secondary | ICD-10-CM | POA: Insufficient documentation

## 2022-12-26 ENCOUNTER — Encounter: Payer: Self-pay | Admitting: Urology

## 2023-01-03 DIAGNOSIS — F332 Major depressive disorder, recurrent severe without psychotic features: Secondary | ICD-10-CM | POA: Diagnosis not present

## 2023-01-03 DIAGNOSIS — F411 Generalized anxiety disorder: Secondary | ICD-10-CM | POA: Diagnosis not present

## 2023-01-04 ENCOUNTER — Other Ambulatory Visit (HOSPITAL_BASED_OUTPATIENT_CLINIC_OR_DEPARTMENT_OTHER): Payer: Self-pay

## 2023-01-05 DIAGNOSIS — F3342 Major depressive disorder, recurrent, in full remission: Secondary | ICD-10-CM | POA: Diagnosis not present

## 2023-01-05 DIAGNOSIS — F411 Generalized anxiety disorder: Secondary | ICD-10-CM | POA: Diagnosis not present

## 2023-01-10 DIAGNOSIS — F3342 Major depressive disorder, recurrent, in full remission: Secondary | ICD-10-CM | POA: Diagnosis not present

## 2023-01-10 DIAGNOSIS — F411 Generalized anxiety disorder: Secondary | ICD-10-CM | POA: Diagnosis not present

## 2023-01-13 ENCOUNTER — Other Ambulatory Visit (HOSPITAL_BASED_OUTPATIENT_CLINIC_OR_DEPARTMENT_OTHER): Payer: Self-pay

## 2023-01-17 DIAGNOSIS — F3342 Major depressive disorder, recurrent, in full remission: Secondary | ICD-10-CM | POA: Diagnosis not present

## 2023-01-17 DIAGNOSIS — F411 Generalized anxiety disorder: Secondary | ICD-10-CM | POA: Diagnosis not present

## 2023-01-19 DIAGNOSIS — J301 Allergic rhinitis due to pollen: Secondary | ICD-10-CM | POA: Diagnosis not present

## 2023-01-24 DIAGNOSIS — F3342 Major depressive disorder, recurrent, in full remission: Secondary | ICD-10-CM | POA: Diagnosis not present

## 2023-01-24 DIAGNOSIS — F411 Generalized anxiety disorder: Secondary | ICD-10-CM | POA: Diagnosis not present

## 2023-01-31 DIAGNOSIS — F3342 Major depressive disorder, recurrent, in full remission: Secondary | ICD-10-CM | POA: Diagnosis not present

## 2023-01-31 DIAGNOSIS — F411 Generalized anxiety disorder: Secondary | ICD-10-CM | POA: Diagnosis not present

## 2023-02-03 ENCOUNTER — Other Ambulatory Visit (HOSPITAL_BASED_OUTPATIENT_CLINIC_OR_DEPARTMENT_OTHER): Payer: Self-pay

## 2023-02-07 DIAGNOSIS — F411 Generalized anxiety disorder: Secondary | ICD-10-CM | POA: Diagnosis not present

## 2023-02-07 DIAGNOSIS — F3342 Major depressive disorder, recurrent, in full remission: Secondary | ICD-10-CM | POA: Diagnosis not present

## 2023-03-10 ENCOUNTER — Other Ambulatory Visit (HOSPITAL_BASED_OUTPATIENT_CLINIC_OR_DEPARTMENT_OTHER): Payer: Self-pay

## 2023-09-07 ENCOUNTER — Other Ambulatory Visit (HOSPITAL_BASED_OUTPATIENT_CLINIC_OR_DEPARTMENT_OTHER): Payer: Self-pay

## 2023-09-07 MED ORDER — EPINEPHRINE 0.3 MG/0.3ML IJ SOAJ
0.3000 mg | INTRAMUSCULAR | 2 refills | Status: AC | PRN
  Filled 2023-09-07: qty 2, 2d supply, fill #0

## 2023-09-21 ENCOUNTER — Other Ambulatory Visit (HOSPITAL_BASED_OUTPATIENT_CLINIC_OR_DEPARTMENT_OTHER): Payer: Self-pay

## 2023-09-21 MED ORDER — TUBERCULIN SYRINGE 27G X 1/2" 1 ML MISC
1.0000 | 3 refills | Status: AC
  Filled 2023-09-21: qty 100, 100d supply, fill #0
  Filled 2023-11-10: qty 100, 365d supply, fill #0

## 2023-09-21 MED ORDER — EPINEPHRINE 0.3 MG/0.3ML IJ SOAJ
INTRAMUSCULAR | 2 refills | Status: AC
  Filled 2023-09-21: qty 2, 1d supply, fill #0

## 2023-09-22 ENCOUNTER — Other Ambulatory Visit (HOSPITAL_BASED_OUTPATIENT_CLINIC_OR_DEPARTMENT_OTHER): Payer: Self-pay

## 2023-10-02 ENCOUNTER — Other Ambulatory Visit (HOSPITAL_BASED_OUTPATIENT_CLINIC_OR_DEPARTMENT_OTHER): Payer: Self-pay

## 2023-10-03 ENCOUNTER — Other Ambulatory Visit (HOSPITAL_BASED_OUTPATIENT_CLINIC_OR_DEPARTMENT_OTHER): Payer: Self-pay

## 2023-11-10 ENCOUNTER — Other Ambulatory Visit (HOSPITAL_BASED_OUTPATIENT_CLINIC_OR_DEPARTMENT_OTHER): Payer: Self-pay

## 2024-01-15 ENCOUNTER — Other Ambulatory Visit (HOSPITAL_BASED_OUTPATIENT_CLINIC_OR_DEPARTMENT_OTHER): Payer: Self-pay

## 2024-01-15 ENCOUNTER — Encounter (HOSPITAL_BASED_OUTPATIENT_CLINIC_OR_DEPARTMENT_OTHER): Payer: Self-pay

## 2024-01-15 MED ORDER — EPINEPHRINE 0.3 MG/0.3ML IJ SOAJ
0.3000 mg | Freq: Once | INTRAMUSCULAR | 2 refills | Status: AC | PRN
Start: 2024-01-15 — End: ?
  Filled 2024-01-15: qty 2, 30d supply, fill #0
  Filled 2024-04-17: qty 2, 1d supply, fill #0

## 2024-01-25 ENCOUNTER — Other Ambulatory Visit (HOSPITAL_BASED_OUTPATIENT_CLINIC_OR_DEPARTMENT_OTHER): Payer: Self-pay

## 2024-01-26 ENCOUNTER — Other Ambulatory Visit (HOSPITAL_BASED_OUTPATIENT_CLINIC_OR_DEPARTMENT_OTHER): Payer: Self-pay

## 2024-04-17 ENCOUNTER — Other Ambulatory Visit (HOSPITAL_BASED_OUTPATIENT_CLINIC_OR_DEPARTMENT_OTHER): Payer: Self-pay

## 2024-04-17 MED ORDER — EPINEPHRINE 0.3 MG/0.3ML IJ SOAJ
INTRAMUSCULAR | 0 refills | Status: AC
  Filled 2024-04-17: qty 2, 30d supply, fill #0
